# Patient Record
Sex: Male | Born: 1945
Health system: Southern US, Community
[De-identification: ages and names within clinical notes are randomized; demographics above are authoritative.]

## PROBLEM LIST (undated history)

## (undated) DIAGNOSIS — H269 Unspecified cataract: Secondary | ICD-10-CM

## (undated) DIAGNOSIS — I2510 Atherosclerotic heart disease of native coronary artery without angina pectoris: Secondary | ICD-10-CM

## (undated) DIAGNOSIS — N210 Calculus in bladder: Secondary | ICD-10-CM

## (undated) DIAGNOSIS — I10 Essential (primary) hypertension: Secondary | ICD-10-CM

## (undated) DIAGNOSIS — E785 Hyperlipidemia, unspecified: Secondary | ICD-10-CM

## (undated) HISTORY — DX: Unspecified cataract: H26.9

## (undated) HISTORY — DX: Essential (primary) hypertension: I10

## (undated) HISTORY — DX: Calculus in bladder: N21.0

## (undated) HISTORY — DX: Hyperlipidemia, unspecified: E78.5

## (undated) HISTORY — DX: Atherosclerotic heart disease of native coronary artery without angina pectoris: I25.10

---

## 1998-06-08 ENCOUNTER — Inpatient Hospital Stay (HOSPITAL_COMMUNITY): Admission: EM | Admit: 1998-06-08 | Discharge: 1998-06-11 | Payer: Self-pay | Admitting: Emergency Medicine

## 1998-06-09 ENCOUNTER — Encounter: Payer: Self-pay | Admitting: Cardiovascular Disease

## 1998-06-10 HISTORY — PX: CARDIAC CATHETERIZATION: SHX172

## 2002-08-05 ENCOUNTER — Encounter: Payer: Self-pay | Admitting: Urology

## 2002-08-07 ENCOUNTER — Inpatient Hospital Stay (HOSPITAL_COMMUNITY): Admission: EM | Admit: 2002-08-07 | Discharge: 2002-08-09 | Payer: Self-pay | Admitting: Urology

## 2002-08-07 ENCOUNTER — Encounter (INDEPENDENT_AMBULATORY_CARE_PROVIDER_SITE_OTHER): Payer: Self-pay | Admitting: Specialist

## 2002-12-11 ENCOUNTER — Other Ambulatory Visit: Admission: RE | Admit: 2002-12-11 | Discharge: 2002-12-11 | Payer: Self-pay | Admitting: Dermatology

## 2005-01-05 ENCOUNTER — Ambulatory Visit: Payer: Self-pay | Admitting: Family Medicine

## 2007-07-15 ENCOUNTER — Inpatient Hospital Stay (HOSPITAL_COMMUNITY): Admission: EM | Admit: 2007-07-15 | Discharge: 2007-07-17 | Payer: Self-pay | Admitting: Emergency Medicine

## 2007-07-16 HISTORY — PX: CARDIAC CATHETERIZATION: SHX172

## 2008-07-03 IMAGING — CR DG CHEST 1V PORT
1 series · 1 of 1 positions shown · non-contrast
Comparison: none

CLINICAL DATA: Chest pain, left arm pain, and diaphoresis.  History of coronary artery disease with coronary artery stents. 
 PORTABLE CHEST:

[AP]
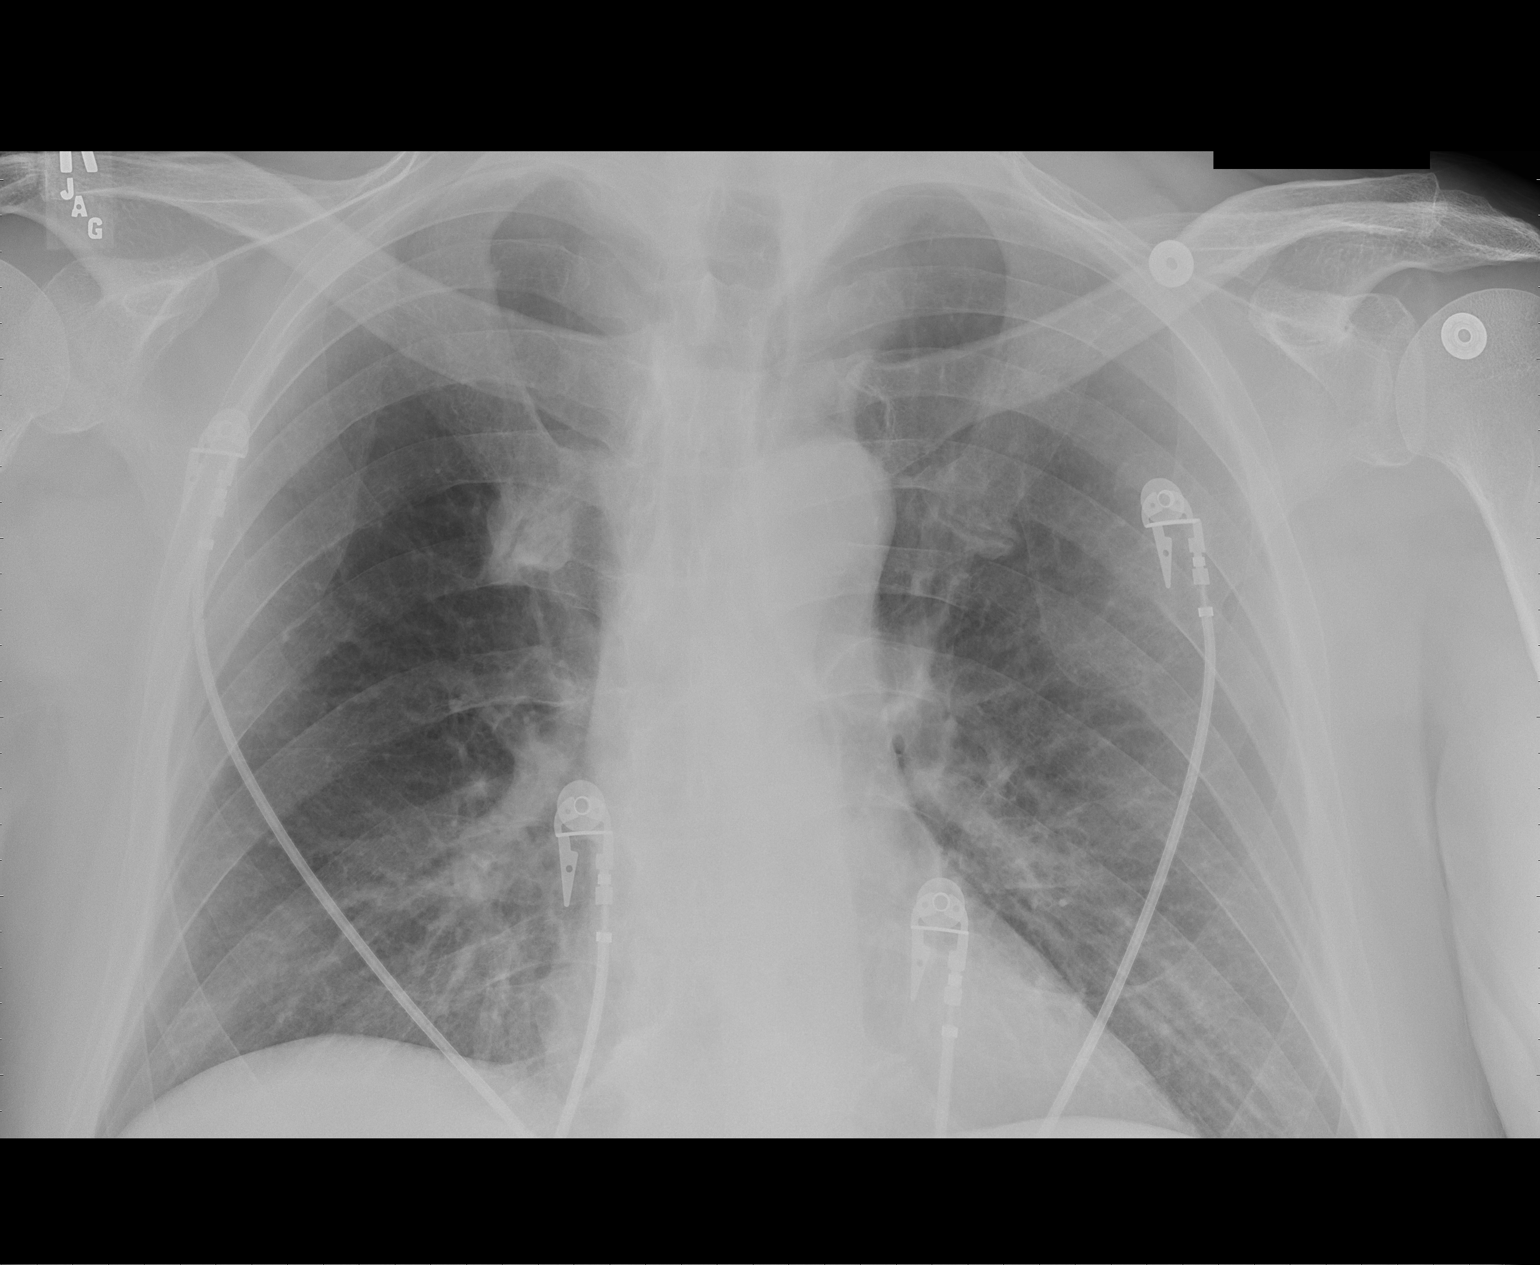

[1 of 1 positions shown; findings below may reference images not displayed]

FINDINGS: Heart size and vascularity are normal and the lungs are clear.  No bony abnormality.
IMPRESSION: No acute disease.

## 2008-08-03 HISTORY — PX: NM MYOCAR PERF WALL MOTION: HXRAD629

## 2010-11-15 NOTE — Cardiovascular Report (Signed)
NAMEPRUDENCIO, VELAZCO NO.:  [id number]   MEDICAL RECORD NO.:  [id number]          PATIENT TYPE:  INP   LOCATION:  2807                         FACILITY:  [facility]   PHYSICIAN:  Madaline Savage, M.D.DATE OF BIRTH:  [birth year]   DATE OF PROCEDURE:  DATE OF DISCHARGE:                            CARDIAC CATHETERIZATION   DATE OF PROCEDURE:  July 16, 2007.   PROCEDURES PERFORMED:  1. Intracoronary artery balloon angioplasty of the ostial and proximal      diagonal branch of the LAD.  2. Stenting of the proximal and ostial portions of the first diagonal      branch of the LAD.  3. Direct stenting of a mid circumflex stenosis of 75% reduced to      zero.   COMPLICATIONS:  None.   ENTRY SITE:  Right femoral.   DYE USED:  Omnipaque.   MEDICATIONS GIVEN:  1. Fentanyl 50 mg.  2. Versed 1 mg.  3. Plavix 600 mg p.o.  4. Infusion of Integrilin drip and multiple doses of unfractionated      heparin for anticoagulation purposes.   PATIENT PROFILE:  The patient is a pleasant 65 year old white gentleman  who is a medical patient of Dr. Joette Catching and a cardiology patient  of Dr. Susa Griffins, who entered the hospital with chest pain  yesterday and underwent catheterization by Dr. Alanda Amass today.  He was  found to have 90-95% proximal and ostial diagonal branch number 1  stenosis and then 75% stenosis of a mid circumflex obtuse marginal  branch that was just beyond a patent crown stent in the mid circumflex.  The cardiac catheterization done by Dr. Alanda Amass prior to this  intervention notes that there was a patent stent in the ostium of the  RCA, a 30% stenosis in the mid RCA and his ventricular function was 60%.   There were 2 guide catheters used for the procedure.  One was a left  Judkins-4 for the diagonal and a runway catheter which was a circumflex  left 3.5 configuration was used for the circumflex obtuse marginal.  The  first procedure performed  was on the ostial and proximal diagonal  stenosis of 90% in those 2 locations, and was covered with 1 stent which  was a 2.5 x 18 mm Promus stent which was carefully positioned so as to  respect the ostium of the LAD as it bifurcated into a diagonal and mid-  LAD.  Post-dilatation was not accomplished because of inability to then  thread a second balloon down the guidewire.  We encountered stent struts  that were unfavorable for passage of even a Maverick or a Dura Star  balloon.  Post-dilatation of the Promus stent was therefore not  performed.  The original Promus stent inflation pressure was 12  atmospheres.  The result after Promus stenting was the 2 lesions of 90%  were reduced to 0% residual and TIMI III distal flow was preserved.  There was an area at the junction between the ostial diagonal and the  LAD that was hard to see and looked hazy, and possibly could have  a 30-  40% residual narrowing there, but it is hard to tell.   The obtuse marginal branch number 1 was stented with a 2.5 x 10 mm  Promus stent and post-dilated with a Dura Star 3.0 x 10 to inflation  pressures of approximately 16.  That result showed now 2 overlapped  stents in the circumflex OM midway along the vessel with preservation of  TIMI III flow and a widely patent lumen.  No complications occurred.  The patient tolerated the procedure very well and was removed from the  cath table to the holding area, and will continue his Integrilin for  another 18 hours.   FINAL IMPRESSION:  Intracoronary artery stenting after balloon  angioplasty of the proximal and mid diagonal branch of the left anterior  descending with reduction of 90% stenosis to 0% residual in the proximal  diagonal and perhaps a 30% residual stenosis in the ostium of the  diagonal.  1. Successful stenting of the mid circumflex obtuse marginal branch      with reduction of a  75% lesion to 0% residual.           ______________________________   Madaline Savage, M.D.     WHG/MEDQ  D:  07/16/2007  T:  07/16/2007  Job:  [id number]   cc:   Gerlene Burdock A. Alanda Amass, M.D.

## 2010-11-15 NOTE — Discharge Summary (Signed)
William Jefferson, William Jefferson             ACCOUNT NO.:  [id number]   MEDICAL RECORD NO.:  [id number]          PATIENT TYPE:  INP   LOCATION:  6532                         FACILITY:  [facility]   PHYSICIAN:  Richard A. Alanda Amass, M.D.DATE OF BIRTH:  1945/12/19   DATE OF ADMISSION:  07/15/2007  DATE OF DISCHARGE:  07/17/2007                               DISCHARGE SUMMARY   DISCHARGE DIAGNOSES:  1. Angina, resolved.  2. Non-ST-elevation myocardial infarction.  3. Coronary disease with previous history of circumflex and right      coronary artery stents that were patent on this admission.      a.     New stents to the diagonal and to the circumflex for 90%       stenosis in the diagonal and 75% in the circumflex.      b.     Normal ejection fraction.  4. Hyperlipidemia with LDL of 136.  5. Hypertension, controlled.  6. Nonsustained ventricular tachycardia, resolved.  7. History of bladder stones and bladder outlet obstruction.  8. History of benign prostatic hypertrophy.  9. Coronary disease with previous stents placed in 1999.  Patient did      very well until July 15, 2007.   ALLERGIES:  PENICILLIN causes diarrhea.  SULFA reaction.  Intolerances  to CRESTOR, ZETIA, LIPITOR and ZOCOR caused severe leg fatigue.   DISCHARGE CONDITION:  Improved.   PROCEDURES:  1. On July 16, 2007, combined left heart catheterization by Dr.      Susa Griffins.  2. On July 16, 2007, PTCA and stent deployment with PROMUS drug      eluting stents to the circumflex and to the diagonal of the LAD.   DISCHARGE MEDICATIONS:  1. Saw palmetto daily as before.  2. Vitamins C, E as before.  3. Metoprolol tartrate 25 mg twice a day as before.  4. Aspirin 325 mg daily.  5. Continue sublingual nitroglycerin as needed for chest pain 1/150.   NEW MEDICATIONS INCLUDE:  1. Lisinopril 5 mg one daily.  2. Pravachol 2 mg one every evening.  3. Niaspan 500 mg one tablet at bedtime.  Take aspirin 30 minutes   prior to the Niaspan and take with low-fat crackers.  4. Plavix 75 mg one daily.  Do not stop; it could cause a heart      attack.  5. Continue Aciphex that Dr. Lysbeth Galas had given you.   DISCHARGE INSTRUCTIONS:  1. Do not return to work until you see Dr. Alanda Amass back in the      office.  2. Increase activity slowly, following cardiac rehab advice.  3. May shower or bathe.  4. No lifting for two weeks.  5. No driving for five days.  6. Wash right-groin cath site with soap and water.  Call if any      bleeding, swelling or drainage.  7. Follow up with Dr. Alanda Amass July 29, 2007, at 4:15.  We      canceled the August 02, 2007, appointment.   HISTORY OF PRESENT ILLNESS:  Patient is a 65 year old white married  male, patient of Dr. Alanda Amass.  He was seen by Dr. Lysbeth Galas on July 15, 2007, after presenting to their office with chest discomfort.  He  had been in his usual state of health.  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  Last week,  walking on January 12, he was going up and down steps in his home, doing  the laundry, etc, and developed midsternal chest discomfort, described  as a pressure, radiating across his chest.  No radiation to his back or  neck.  He did feel it went down his arms, but he was not quite sure  because he has been having wrist pain anyway.  He also broke out in a  cold sweat, but no shortness of breath or nausea.  He took one  nitroglycerin with relief, continued on with his daily activities.  Pain  returned.  Another nitro with relief.  Eventually, he took four  nitroglycerin, each time with relief, but continuing activities caused  the pain to return.  He was seen by Dr. Lysbeth Galas.  EKG was without change.  We saw him in Dr. Kandis Cocking office at their recommendation and did  take his history of being very stable since 1999, and now new pain.  Dr.  Alanda Amass was quite concerned, went ahead and  admitted him to [facility] on IV heparin.  Cardiac enzymes did come back positive.   Patient never had any further chest pain after seeing Dr. Alanda Amass in  the office until discharge.   PAST MEDICAL HISTORY:  Patient was not on statin, secondary to  intolerance, extreme leg fatigue with Crestor, Zetia, Lipitor and Zocor.  Dr. Alanda Amass and I discussed this in the office and in the hospital  went ahead and started Pravachol and Niaspan to see how he would do.  If  he becomes intolerant to all statins, then Welchol would be the next  choice to use in addition to Niaspan, if possible.   Past medical history as stated with history in 1999 of stents to the RCA  and to the circumflex that remain patent now.  He has had negative  nuclear studies since that time, the last one being in 2006.  He has had  a history of BPH, bladder stones and bladder outlet obstruction.   FAMILY HISTORY/SOCIAL HISTORY/REVIEW OF SYSTEMS:  See H and P.   PHYSICAL EXAM AT DISCHARGE:  Blood pressure 99/46 to 121/70, pulse 80s  to 90s, respirations 18, afebrile.  HEART:  Regular rate and rhythm without murmur, gallop or rub.  LUNGS:  Clear.  EXTREMITIES:  Right groin was stable, without hematoma.   LABORATORY DATA:  Hemoglobin on admission 16.6, hematocrit 49.3, WBC 12,  platelets 265, MCV 94, neutrophils 70, and this remained stable.  White  count dropped to 9.3 and was 11 at discharge.  Chemistry:  Sodium 142,  potassium 4.4, chloride 106, CO2 26, BUN 13, creatinine 1.30 and at  discharge 1.17, glucose 91.  Coags:  Pro-time on admission 12.5, INR  0.9, PTT 27, heparin 0.44 on heparin infusion.   GI:  AST 23, ALT 27, alkaline phosphatase 80, total bilirubin 0.8 and  albumin 3.8.  Cardiac enzymes:  Initial CK 220 with an MB of 17,  troponin I of 1.56.  At 8 hours, CK went to 301 with MB of 23.9 and a  troponin of 3.80, which was the peak and has come down since that time;  troponin 2.71,  CK-MB 218 with MB  of 14.3, and at discharge, troponin  1.30, CK 133 and MB 4.5.   Total cholesterol was 183, LDL 136, HDL 23, and triglycerides 621 and  therefore, we will keep him on Niaspan and Pravachol as he tolerates.   Calcium 8.9, magnesium 2.1.  Urinalysis:  I do not have those results.  BNP on admission was less than 30 and TSH was normal at 1.007.   Chest x-ray on admission:  No acute disease.   Cardiac catheterization:  As stated with normal EF of 60%.   HOSPITAL COURSE:  Patient was seen by Dr. Lysbeth Galas, referring to Dr.  Alanda Amass, and we admitted him to [facility] to rule out MI with many  years of previous no chest pain.  He was started on IV heparin.  He was  not started on nitroglycerin because he was not having any chest pain  and here, in the hospital, his enzymes did elevate for positive non-ST-  elevation MI.  His EKG never changed either.  Intravenous Integrilin was  added once enzymes were positive.  Patient did well that night.  The  next day, he underwent cardiac catheterization with new stenosis in the  diagonal of the LAD to 90%, most likely culprit vessel, and then a 75%  stenosis in the circumflex.  His previously-placed stents were open.  Patient did have distal LAD stenosis of 80-85% which would be treated  medically.  Patient underwent drug eluting stent PROMUS stents by Dr.  Elsie Lincoln and tolerated the procedure well.  He was given loading dose of  Plavix and transferred to 5500.  By January 14, the patient was  ambulating in the hall.  Cardiac rehab did talk with him.  Dr. Allyson Sabal saw  him and felt he was ready for discharge home, though he is not to return  to work until he sees Dr. Alanda Amass back.   On initial admission, he did have short bursts of four beats of  nonsustained ventricular tachycardia and none since that time.  We will  continue to try the Pravachol and the Niaspan.  Hopefully he will be  able to tolerate it and then he will follow up with Dr.  Alanda Amass.   Also, patient is contemplating attending cardiac rehab in [place].      Darcella Gasman. [place], N.P.      Richard A. Alanda Amass, M.D.  Electronically Signed    LRI/MEDQ  D:  07/17/2007  T:  07/17/2007  Job:  [id number]   cc:   Madaline Savage, M.D.  Delaney Meigs, M.D.

## 2010-11-15 NOTE — Cardiovascular Report (Signed)
William Jefferson, William Jefferson NO.:  [id number]   MEDICAL RECORD NO.:  [id number]          PATIENT TYPE:  INP   LOCATION:  2007                         FACILITY:  [facility]   PHYSICIAN:  William Jefferson, M.D.DATE OF BIRTH:  [birth year]   DATE OF PROCEDURE:  07/16/2007  DATE OF DISCHARGE:                            CARDIAC CATHETERIZATION   PROCEDURE:  Retrograde central aortic catheterization, selective  coronary angiography, pre and post IC nitroglycerin administration via  Judkins technique, LV angiogram, RAO/LAO projection, subselective LIMA,  abdominal aortic angiogram, midstream PA projection.   PROCEDURE:  The patient was brought to the second floor CP lab in the  postabsorptive state after 5 mg Valium p.o. premedication.  The right  groin was prepped, draped in the usual manner.  One percent Xylocaine  was used for local anesthesia.  Heparin and Integrilin were going.  Heparin was held on-call to the.  Integrilin was continued because of  SCMI with admission July 15, 2007 without EKG changes and new onset  chest pain with mild CPK, MB and troponin elevation (230, 23, 3).  No  recurrent chest pain since admission.  Informed consent was obtained to  proceed with the diagnostic study.  CRFA was entered with single  anterior puncture using an 18 thin-wall needle, and a 6-French short  sidearm sheath was inserted without difficulty.  Diagnostic coronary  angiography was done with 6-French 4-cm taper Cordis preformed coronary  and pigtail catheters with Omnipaque dye.  Guidewire exchange used  throughout the procedure.  Nitroglycerin 200 mcg was given into the left  coronary artery with repeat injections obtained in multiple projections  and fields of view.  Subselective LIMA was done by hand injection with  the right coronary catheter showing widely patent left subclavian, good  anterior vertebral flow and widely patent ungrafted LIMA.  LV angiogram  was done the  RAO and LAO projection:  25 mL - 14 mL per second; 20 mL -  12 mL per second.  Pullback pressure CA showed no gradient across the  aortic valve.  Pigtail catheter was brought down above the level of the  renal arteries, and abdominal aortic angiogram was done by hand  injection in the PA projection.  This demonstrated widely patent single  renal arteries bilaterally with no significant infrarenal narrowing or  aneurysm formation.  Catheters were removed.  Right femoral angiogram  was done in the shallow RAO projection showing good puncture into the  CRFA.  Side-arm sheath was flushed.  The patient tolerated the procedure  well.  Angiograms will be reviewed with my colleague, Dr. Elsie Lincoln.   Pressures:  LV:  110/0; LVEDP 12 mmHg.   CA:  110/70 mmHg.   There is no gradient between the LV and CA on catheter pullback.   Fluoroscopy did not reveal any significant coronary, intracardiac or  valvular calcification.  The previously placed right ostial stent and  mid CX OM1 stent were visible fluoroscopically.   LV angiogram in the RAO and LAO projection showed a vigorously  contracting ventricle, EF approximately 60% with no segmental wall  motion abnormality or mitral regurgitation.  The main left coronary was normal.   The proximal LAD was a very large vessel.  When it gave off the large  DX1 and continued, the lumen tapered down to about half the size which  was about 2.75-3.0 with the  proximal LAD about 5-0 vessel.  There was  no significant proximal LAD stenosis.  The LAD coursed to the apex of  the heart where it bifurcated.  There was an 80-85% mildly segmental  stenosis at the junction of the mid and distal third of the LAD, well  before the bifurcation.  There was approximately 40% narrowing of the  LAD mildly segmental beyond the first diagonal branch.   The first diagonal branch came out at a 70-90 degree angle from the LAD  and had an 85% ostial followed by a 90% proximal  stenosis.  It was a  moderately large vessel, probably 2.75 or greater, and bifurcated  distally.  There was good flow and no thrombus seen.   The circumflex artery gave off a large first marginal branch with a  patent, previously placed proximal - mid crown stent that had less than  20% narrowing.  Beyond the stent in the mid OM, there was a 60%  concentric narrowing, possibly 50% on some views, but there was good  flow.   The AV groove circ was widely patent and left atrial branch came off.   The right coronary was a large dominant vessel.  The previously placed  P150 iliac stent in the ostial and proximal RCA was widely patent with  less than 20% narrowing.  There was no ostial stenosis and no gradient  across the proximal RCA with the 6-French catheter.  There was 30%  concentric smooth narrowing in the mid RCA beyond a RV branch.  The PDA  branch arose before the acute margin, had no significant distal disease  __________  PLA branch bifurcated and had no significant distal disease.   DISCUSSION:  William Jefferson is a 65 year old white married father of 3.  He  was seen initially by [place] in 1999 with acute coronary syndrome and  underwent catheterization.  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.  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  He  has been active and ambulatory.  Has discontinued smoking.  Unfortunately, he has mild lipid elevation because he has been  intolerant to all statins try including PY 450 statins, and he has also  been intolerant to Zetia.  He has been on chronic aspirin and beta  blockers.  Recurrent angina, prompting admission, and elevated enzymes  compatible with SCMI and one episode of asymptomatic NSVT.  There is  good LV function, and he has high-grade proximal DX1 disease and distal  LAD disease.  The DX1 is probably the culprit lesion.   He has previous  ostial RCA stenting with a P150 iliac stent in a very  large RCA June 10, 1998.  He also 90% circumflex non-DES stenting  with a 3.5/16 __________  stent.  Both of these have remained open long-  term.   I have reviewed these angiograms with Dr. Elsie Lincoln.  Although we had some  LAD disease, the LAD disease is distal.  We feel that attempted PCI of  his culprit lesion diagonal and PCI of his distal LAD are warranted in  this setting.  I would not consider CABG without significant LAD  or  right coronary or circumflex disease which does not exist in this  patient.   He has good LV function and is able to take aspirin and antiplatelet  therapy.   CATHETERIZATION DIAGNOSES:  1. Arteriosclerotic heart disease - acute coronary syndrome December      1999 with successful non-DES iliac ostial and proximal RCA stenting      and non-DES mid CX OM stenting, patent long-term.  2. Recurrent angina with a small SCMI without EKG changes, culprit      lesion ostial and proximal DX1.  Associated mid to distal LAD      stenosis.  3. Normal left ventricular function.  4. Hyperlipidemia, intolerant to all statins and Zetia tried in the      past.  5. Remote smoker, quit 1999.      William Jefferson, M.D.  Electronically Signed     RAW/MEDQ  D:  07/16/2007  T:  07/16/2007  Job:  [id number]   cc:   Gerlene Burdock A. Alanda Jefferson, M.D.  CP Lab  Delaney Meigs, M.D.  Madaline Savage, M.D.

## 2010-11-18 NOTE — Op Note (Signed)
NAME:  William Jefferson, William Jefferson                       ACCOUNT NO.:  [id number]   MEDICAL RECORD NO.:  [id number]                   PATIENT TYPE:  AMB   LOCATION:  DFTL                                 FACILITY:  [facility]   PHYSICIAN:  Excell Seltzer. Annabell Howells, M.D.                 DATE OF BIRTH:  December 14, 1945   DATE OF PROCEDURE:  DATE OF DISCHARGE:                                 OPERATIVE REPORT   PROCEDURE:  Cystolithalopaxy for 3.2 cm stone and TURP.   PREOPERATIVE DIAGNOSES:  Bladder stone and benign prostatic hypertrophy with  bladder outlet obstruction.   POSTOPERATIVE DIAGNOSES:  Bladder stone and benign prostatic hypertrophy  with bladder outlet obstruction.   SURGEON:  Excell Seltzer. Annabell Howells, M.D.   ANESTHESIA:  General.   SPECIMENS:  Stone fragments and prostate chips.   DRAINS:  22 French three-way Foley catheter.   COMPLICATIONS:  None.   INDICATIONS FOR PROCEDURE:  William Jefferson is a 65 year old white male who  originally presented with acute epididymal orchitis. Following treatment,  his urine had persistent pyuria and hematuria. He had a history of stones, a  KUB was obtained which revealed a 3.2 x 2.5 cm calcification in the bladder.  It was felt that cystolithalopaxy with possible TURP was indicated. The  patient was made well aware of all risks involved with the procedure.   FINDINGS AND PROCEDURE:  The patient had been on antibiotics preoperatively.  He was given a dose of Tequin the morning of surgery, he was taken to the  operating room where a general anesthetic was induced. He was fitted with  thigh high TED hose, placed in lithotomy position. His perineum and  genitalia were prepped with Betadine solution, he was draped in the usual  sterile fashion. Cystoscopy was performed using the 22 [place] scope and the  12 and 70 degree lenses. Examination revealed a normal urethra and intact  external sphincter. The prostatic urethra had trilobar hyperplasia with  primarily a large middle  lobe which was obstructing. Examination of the  bladder revealed mild trabeculation. There was a large stone free within the  bladder lumen. There was significant inflammation of the bladder wall from  the stone and infection. The ureteral orifices were unremarkable. The  cystoscope was removed, the urethra was calibrated to 30 [place] with [employer] sounds and a 28 French continuous flow resectoscope sheath was  inserted. This was fitted with standard cystoscope bridge with an extension  and a 12 degree lens and the laser fiber was then passed along through the  working port to the stone. Power was initially set on 0.8 but was gradually  increased to 1.5 during treatment due to the hardness of the stone. The  stone was engaged. Initially there was some difficulty keeping the laser  fiber centered in the scope so eventually an Albarrans bridge was used along  with the Steri-Strips to secure the laser fiber  in appropriate position.  Once the position of the fiber was adequate, we were able to gradually  fragment the stone into 1 cm or less fragments. Once the stone had been  completely fragmented which took approximately 45 minutes to an hour, the  stone fragments were removed without difficulty. I then placed an Loa  handle with a 12 degree lens and 26 loop through the resectoscope and  switched from saline to glycine and then performed a TURP. The initial  resection was from bladder 5 to 7 o'clock at the bladder neck with resection  of the middle lobe. The floor of the prostate was then resected out along  side the veru. There was not a large amount of lateral lobe enlargement,  some resection of the lateral lobes was performed bilaterally but I was  careful to avoid injury to the apical area. No anterior resection was  needed. Once adequate tissue was resected, hemostasis was achieved and the  bladder was evacuated free of chips and some remaining stone dust. At this  point, final  inspection revealed an intact external sphincter, no active  bleeding. The ureteral orifices were intact as well. The bladder was left  full, the scope was removed, pressure on the bladder produced an excellent  stream. A 22 French three-way Foley catheter was then inserted, the balloon  was filled with 30 cc of sterile fluid and held on traction while the  catheter was irrigated. The irrigant rapidly cleared. The patient was placed  on continuous irrigation and the catheter was placed to straight drainage  and secured with mild traction to the patient's leg after he was taken down  from the lithotomy position. His anesthetic was then reversed. He was moved  to the recovery room in stable condition. There were no complications.                                               Excell Seltzer. Annabell Howells, M.D.    JJW/MEDQ  D:  08/07/2002  T:  08/07/2002  Job:  [id number]   cc:   Colon Flattery  [address]  [place]  [place] [address]  Fax: [phone]   Richard A. Alanda Amass, M.D.  [phone] [address]  [place]  [place] [address]  Fax: [phone]

## 2010-12-02 ENCOUNTER — Ambulatory Visit (INDEPENDENT_AMBULATORY_CARE_PROVIDER_SITE_OTHER): Payer: Federal, State, Local not specified - PPO | Admitting: Urology

## 2010-12-02 DIAGNOSIS — N453 Epididymo-orchitis: Secondary | ICD-10-CM

## 2010-12-02 DIAGNOSIS — D400 Neoplasm of uncertain behavior of prostate: Secondary | ICD-10-CM

## 2010-12-02 DIAGNOSIS — R972 Elevated prostate specific antigen [PSA]: Secondary | ICD-10-CM

## 2011-01-06 ENCOUNTER — Ambulatory Visit (INDEPENDENT_AMBULATORY_CARE_PROVIDER_SITE_OTHER): Payer: Federal, State, Local not specified - PPO | Admitting: Urology

## 2011-01-06 DIAGNOSIS — N453 Epididymo-orchitis: Secondary | ICD-10-CM

## 2011-01-06 DIAGNOSIS — N401 Enlarged prostate with lower urinary tract symptoms: Secondary | ICD-10-CM

## 2011-01-06 DIAGNOSIS — R972 Elevated prostate specific antigen [PSA]: Secondary | ICD-10-CM

## 2011-03-23 LAB — MAGNESIUM: Magnesium: 2.1

## 2011-03-23 LAB — CARDIAC PANEL(CRET KIN+CKTOT+MB+TROPI)
CK, MB: 23.9 — ABNORMAL HIGH
CK, MB: 4.5 — ABNORMAL HIGH
Total CK: 133
Total CK: 301 — ABNORMAL HIGH
Troponin I: 1.3

## 2011-03-23 LAB — COMPREHENSIVE METABOLIC PANEL
ALT: 27
BUN: 13
CO2: 26
Calcium: 9.4
Creatinine, Ser: 1.3
GFR calc non Af Amer: 56 — ABNORMAL LOW
Glucose, Bld: 96
Sodium: 142
Total Protein: 7.1

## 2011-03-23 LAB — BASIC METABOLIC PANEL
Calcium: 8.9
Creatinine, Ser: 1.17
GFR calc Af Amer: >60
GFR calc non Af Amer: >60
Sodium: 136

## 2011-03-23 LAB — CK TOTAL AND CKMB (NOT AT ARMC)
CK, MB: 17 — ABNORMAL HIGH
CK, MB: 6.9 — ABNORMAL HIGH
Relative Index: 7.7 — ABNORMAL HIGH
Total CK: 141
Total CK: 220

## 2011-03-23 LAB — CBC
HCT: 48.1
HCT: 49.3
Hemoglobin: 16.3
Hemoglobin: 16.6
MCHC: 33.7
MCHC: 33.9
MCHC: 34.1
MCV: 93.6
MCV: 94.1
Platelets: 236
Platelets: 250
Platelets: 265
RBC: 5.24
RDW: 12.8
RDW: 12.9
RDW: 13
WBC: 12.1 — ABNORMAL HIGH

## 2011-03-23 LAB — LIPID PANEL
Cholesterol: 183
LDL Cholesterol: 136 — ABNORMAL HIGH
Total CHOL/HDL Ratio: 8
Triglycerides: 118

## 2011-03-23 LAB — PROTIME-INR
INR: 0.9
Prothrombin Time: 12.5

## 2011-03-23 LAB — TSH: TSH: 1.007

## 2011-03-23 LAB — DIFFERENTIAL
Eosinophils Absolute: 0.1
Lymphs Abs: 2.5
Monocytes Relative: 8
Neutro Abs: 8.5 — ABNORMAL HIGH
Neutrophils Relative %: 70

## 2011-03-23 LAB — TROPONIN I
Troponin I: 1.4
Troponin I: 1.56

## 2012-08-31 HISTORY — PX: TRANSTHORACIC ECHOCARDIOGRAM: SHX275

## 2012-09-11 ENCOUNTER — Other Ambulatory Visit (HOSPITAL_COMMUNITY): Payer: Self-pay | Admitting: Cardiovascular Disease

## 2012-09-11 DIAGNOSIS — I4510 Unspecified right bundle-branch block: Secondary | ICD-10-CM

## 2012-09-11 DIAGNOSIS — I358 Other nonrheumatic aortic valve disorders: Secondary | ICD-10-CM

## 2012-09-19 ENCOUNTER — Ambulatory Visit (HOSPITAL_COMMUNITY)
Admission: RE | Admit: 2012-09-19 | Discharge: 2012-09-19 | Disposition: A | Discharge status: Home / Self Care | Payer: Federal, State, Local not specified - PPO | Source: Ambulatory Visit | Attending: Cardiovascular Disease | Admitting: Cardiovascular Disease

## 2012-09-19 DIAGNOSIS — R011 Cardiac murmur, unspecified: Secondary | ICD-10-CM | POA: Insufficient documentation

## 2012-09-19 DIAGNOSIS — I358 Other nonrheumatic aortic valve disorders: Secondary | ICD-10-CM

## 2012-09-19 NOTE — Progress Notes (Signed)
[facility]   2D echo completed 09/19/2012.   Cindy Kaycee Haycraft, RDCS  

## 2012-11-26 ENCOUNTER — Telehealth: Payer: Self-pay | Admitting: Cardiovascular Disease

## 2012-11-26 NOTE — Telephone Encounter (Signed)
Message forwarded to [facility]. Berlinda Last, LPN.  [place] patient.

## 2012-11-26 NOTE — Telephone Encounter (Signed)
Returned call.  Pt informed Dr. Alanda Amass and his nurse, Kem Boroughs will be in the [place] office on Thursday to look at his chart to find out what he needs.  Pt verbalized understanding and agreed w/ plan.

## 2012-12-03 NOTE — Telephone Encounter (Signed)
Called pt. On 12-02-2012 no answer. I was going to talk to the pt. About his call that he made on 11-26-2012 and see oif he had received his paperwok

## 2013-01-17 ENCOUNTER — Other Ambulatory Visit: Payer: Self-pay | Admitting: Cardiology

## 2013-02-06 ENCOUNTER — Encounter: Payer: Self-pay | Admitting: Cardiovascular Disease

## 2013-02-12 ENCOUNTER — Other Ambulatory Visit: Payer: Self-pay | Admitting: Cardiovascular Disease

## 2013-02-12 LAB — LIPID PANEL
Total CHOL/HDL Ratio: 6.1 Ratio
VLDL: 30 mg/dL (ref 0–40)

## 2013-02-12 LAB — COMPREHENSIVE METABOLIC PANEL
ALT: 18 U/L (ref 0–53)
AST: 16 U/L (ref 0–37)
Calcium: 9.5 mg/dL (ref 8.4–10.5)
Chloride: 106 mEq/L (ref 96–112)
Creat: 1.14 mg/dL (ref 0.50–1.35)
Sodium: 140 mEq/L (ref 135–145)
Total Bilirubin: 1 mg/dL (ref 0.3–1.2)

## 2013-02-12 LAB — CBC WITH DIFFERENTIAL/PLATELET
Basophils Absolute: 0 10*3/uL (ref 0.0–0.1)
Eosinophils Relative: 3 % (ref 0–5)
Lymphocytes Relative: 22 % (ref 12–46)
MCV: 89 fL (ref 78.0–100.0)
Neutro Abs: 5.6 10*3/uL (ref 1.7–7.7)
Neutrophils Relative %: 67 % (ref 43–77)
Platelets: 223 10*3/uL (ref 150–400)
RDW: 13.6 % (ref 11.5–15.5)
WBC: 8.3 10*3/uL (ref 4.0–10.5)

## 2013-02-12 LAB — TSH: TSH: 1.66 u[IU]/mL (ref 0.350–4.500)

## 2013-04-28 ENCOUNTER — Other Ambulatory Visit: Payer: Self-pay | Admitting: Cardiology

## 2013-04-28 NOTE — Telephone Encounter (Signed)
Rx was sent to pharmacy electronically. 

## 2013-08-14 ENCOUNTER — Encounter: Payer: Self-pay | Admitting: *Deleted

## 2013-08-14 ENCOUNTER — Ambulatory Visit (INDEPENDENT_AMBULATORY_CARE_PROVIDER_SITE_OTHER): Payer: Federal, State, Local not specified - PPO | Admitting: Internal Medicine

## 2013-08-14 ENCOUNTER — Encounter: Payer: Self-pay | Admitting: Internal Medicine

## 2013-08-14 VITALS — BP 100/62 | HR 62 | Ht 77.0 in | Wt 217.4 lb

## 2013-08-14 DIAGNOSIS — I10 Essential (primary) hypertension: Secondary | ICD-10-CM | POA: Insufficient documentation

## 2013-08-14 DIAGNOSIS — E785 Hyperlipidemia, unspecified: Secondary | ICD-10-CM

## 2013-08-14 DIAGNOSIS — E782 Mixed hyperlipidemia: Secondary | ICD-10-CM | POA: Insufficient documentation

## 2013-08-14 DIAGNOSIS — I2510 Atherosclerotic heart disease of native coronary artery without angina pectoris: Secondary | ICD-10-CM | POA: Insufficient documentation

## 2013-08-14 NOTE — Patient Instructions (Signed)
Your physician wants you to follow-up in: 1 year. You will receive a reminder letter in the mail two months in advance. If you don't receive a letter, please call our office to schedule the follow-up appointment.  

## 2013-08-14 NOTE — Progress Notes (Signed)
OFFICE NOTE  Chief Complaint:  No complaints, establish new cardiologist  Primary Care Physician: William Mustache, MD  HPI:  William Jefferson is a pleasant 68 year old male formerly followed by Dr. Rollene Jefferson. He is here today to establish new cardiac care. His past medical history is significant for coronary artery disease. In 1999 he was having symptoms of angina and presented to her primary care physician who performed an EKG and sent him to the emergency room. There he will have angiography and was found to have a ostial right coronary artery lesion of a very large vessel. He ultimately had placement of an iliac stent, as no coronary stents of that size were available at the time. This is actually stayed very patent for long period of time. In 2009 he had another episode of chest pain and was found to have circumflex and diagonal lesions which were stented with drug-eluting stents. The OM1 was stented with a 2 5 x 10 mm Promus stent that was postdilated to 3.0 mm.  The diagonal was a 2 5 x 18 mm Promus stent that there was also postdilated to 2.75 mm. He's done well since then without any chest pain complaints. 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 He denies any chest pain or shortness of breath. Unfortunately been intolerant to all statins and Zetia. He is unwilling to take any more of those medicines as is found in its cause leg pain, weakness and difficulty performing his job. He is on monotherapy Plavix, but not aspirin, but denies any history of allergy aspirin. His only other issue is a rising PSA. He said a number of prostate biopsies and actually developed an infection at one time. He's had atypical cells but no determinant prostate cancer.  PMHx:  Past Medical History  Diagnosis Date  . Bladder stone     removed in 2014 by Dr. Jeffie Jefferson  . Hyperlipidemia     statin intolerance  . Hypertension   . Coronary  artery disease     Past Surgical History  Procedure Laterality Date  . Cardiac catheterization  06/10/1998    large P150 iliac stent to RCA - 3.5x7mm NIR stent (Dr. Marella Jefferson)  . Transthoracic echocardiogram  08/2012    mild conc LVH  . Nm myocar perf wall motion  08/2008    bruce myoview; normal pattern of perfusion, EF 77%  . Cardiac catheterization  07/16/2007    mid-distal LAD stenosis, normal LV function, 2.5x24mm Promus stent to obtuse marginal, 2.5x19mm Promus stent to osital & proximal diagonal (Dr. Marella Jefferson, Dr. Domenic Jefferson)    FAMHx:  No family history on file.  SOCHx:   reports that he quit smoking about 16 years ago. He does not have any smokeless tobacco history on file. His alcohol and drug histories are not on file.  ALLERGIES:  Allergies  Allergen Reactions  . Crestor [Rosuvastatin]   . Lipitor [Atorvastatin]   . Penicillins   . Sulfa Antibiotics   . Zetia [Ezetimibe]   . Zocor [Simvastatin]     ROS: A comprehensive review of systems was negative.  HOME MEDS: Current Outpatient Prescriptions  Medication Sig Dispense Refill  . cephALEXin (KEFLEX) 500 MG capsule Take 500 mg by mouth daily.      . clopidogrel (PLAVIX) 75 MG tablet TAKE ONE TABLET BY MOUTH ONE TIME DAILY  30 tablet  6  . lisinopril (PRINIVIL,ZESTRIL) 5 MG  tablet TAKE ONE TABLET BY MOUTH ONE TIME DAILY  30 tablet  5  . Lutein 20 MG CAPS Take 1 capsule by mouth daily.      . metoprolol tartrate (LOPRESSOR) 25 MG tablet Take 25 mg by mouth 2 (two) times daily.      . Saw Palmetto 450 MG CAPS Take 1 capsule by mouth daily.      . vitamin C (ASCORBIC ACID) 500 MG tablet Take 500 mg by mouth daily.       No current facility-administered medications for this visit.    LABS/IMAGING: No results found for this or any previous visit (from the past 48 hour(s)). No results found.  VITALS: BP 100/62  Pulse 62  Ht 6\' 5"  (1.956 m)  Wt 217 lb 6.4 oz (98.612 kg)  BMI 25.77 kg/m2  EXAM: General  appearance: alert and no distress Neck: no carotid bruit and no JVD Lungs: clear to auscultation bilaterally Heart: regular rate and rhythm, S1, S2 normal, no murmur, click, rub or gallop Abdomen: soft, non-tender; bowel sounds normal; no masses,  no organomegaly Extremities: extremities normal, atraumatic, no cyanosis or edema Pulses: 2+ and symmetric Skin: Skin color, texture, turgor normal. No rashes or lesions Neurologic: Grossly normal Psych: Mood, affect normal  EKG: Normal sinus rhythm at 62  ASSESSMENT: 1. Coronary artery disease status post PCI to the ostial RCA in 1999, proximal diagonal and circumflex marginal in 2009 2. Dyslipidemia-intolerant to statins and Zetia 3. Hypertension-controlled 4. Elevated PSA  PLAN: 1.   Mr. William Jefferson is doing well and is asymptomatic. 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 He is on Plavix monotherapy, but can't stop this for up to 5 days prior to a prostate biopsy if this is necessary in the future. Unfortunately his been intolerant to statins, but may be a good candidate for a PCSK9 inhibitor in the future.  His blood pressure is well controlled. Plan continue his current medications and we'll see him back annually or sooner as necessary.  Pixie Casino, MD, [facility] Attending Cardiologist [facility]  Dev Dhondt C 08/14/2013, 10:13 AM

## 2013-08-21 ENCOUNTER — Other Ambulatory Visit: Payer: Self-pay | Admitting: Cardiology

## 2013-08-21 NOTE — Telephone Encounter (Signed)
Rx was sent to pharmacy electronically. 

## 2013-10-18 ENCOUNTER — Other Ambulatory Visit: Payer: Self-pay | Admitting: Cardiology

## 2013-10-20 NOTE — Telephone Encounter (Signed)
Rx was sent to pharmacy electronically. 

## 2014-01-20 ENCOUNTER — Other Ambulatory Visit: Payer: Self-pay | Admitting: *Deleted

## 2014-01-20 MED ORDER — METOPROLOL TARTRATE 25 MG PO TABS: 25.0000 mg | ORAL_TABLET | Freq: Two times a day (BID) | ORAL | Status: DC

## 2014-01-20 NOTE — Telephone Encounter (Signed)
Rx refill sent to patient pharmacy   

## 2014-08-15 ENCOUNTER — Other Ambulatory Visit: Payer: Self-pay | Admitting: Internal Medicine

## 2014-08-17 NOTE — Telephone Encounter (Signed)
Rx(s) sent to pharmacy electronically. OV 08/20/14

## 2014-08-20 ENCOUNTER — Ambulatory Visit (INDEPENDENT_AMBULATORY_CARE_PROVIDER_SITE_OTHER): Payer: Medicare Other | Admitting: Internal Medicine

## 2014-08-20 ENCOUNTER — Encounter: Payer: Self-pay | Admitting: Internal Medicine

## 2014-08-20 VITALS — BP 132/82 | HR 58 | Ht 77.0 in | Wt 216.0 lb

## 2014-08-20 DIAGNOSIS — E785 Hyperlipidemia, unspecified: Secondary | ICD-10-CM

## 2014-08-20 DIAGNOSIS — I2510 Atherosclerotic heart disease of native coronary artery without angina pectoris: Secondary | ICD-10-CM | POA: Diagnosis not present

## 2014-08-20 DIAGNOSIS — I2583 Coronary atherosclerosis due to lipid rich plaque: Secondary | ICD-10-CM

## 2014-08-20 NOTE — Progress Notes (Signed)
OFFICE NOTE  Chief Complaint:  No complaints  Primary Care Physician: Sherrie Mustache, MD  HPI:  William Jefferson is a pleasant 69 year old male formerly followed by Dr. Rollene Jefferson. He is here today to establish new cardiac care. His past medical history is significant for coronary artery disease. In 1999 he was having symptoms of angina and presented to her primary care physician who performed an EKG and sent him to the emergency room. There he will have angiography and was found to have a ostial right coronary artery lesion of a very large vessel. He ultimately had placement of an iliac stent, as no coronary stents of that size were available at the time. This is actually stayed very patent for long period of time. In 2009 he had another episode of chest pain and was found to have circumflex and diagonal lesions which were stented with drug-eluting stents. The OM1 was stented with a 2 5 x 10 mm Promus stent that was postdilated to 3.0 mm.  The diagonal was a 2 5 x 18 mm Promus stent that there was also postdilated to 2.75 mm. He's done well since then without any chest pain complaints. 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 He denies any chest pain or shortness of breath. Unfortunately been intolerant to all statins and Zetia. He is unwilling to take any more of those medicines as is found in its cause leg pain, weakness and difficulty performing his job. He is on monotherapy Plavix, but not aspirin, but denies any history of allergy aspirin. His only other issue is a rising PSA. He said a number of prostate biopsies and actually developed an infection at one time. He's had atypical cells but no determinant prostate cancer.  I saw William Jefferson back today. He continues to be without complaints. 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 He does have a history of being intolerant to at  least 5 or 6 different statins. He's not as cholesterol checked in about 4 years. We discussed the new medications that are now out to treat cholesterol a said that he be willing to try at least a low-dose statin is to be on a qualifying dose of max tolerated therapy. We would either have to recheck his cholesterol level.  PMHx:  Past Medical History  Diagnosis Date  . Bladder stone     removed in 2014 by William Jefferson  . Hyperlipidemia     statin intolerance  . Hypertension   . Coronary artery disease     Past Surgical History  Procedure Laterality Date  . Cardiac catheterization  06/10/1998    large P150 iliac stent to RCA - 3.5x47mm NIR stent (Dr. Marella Jefferson)  . Transthoracic echocardiogram  08/2012    mild conc LVH  . Nm myocar perf wall motion  08/2008    bruce myoview; normal pattern of perfusion, EF 77%  . Cardiac catheterization  07/16/2007    mid-distal LAD stenosis, normal LV function, 2.5x61mm Promus stent to obtuse marginal, 2.5x53mm Promus stent to osital & proximal diagonal (Dr. Marella Jefferson, Dr. Domenic Jefferson)    FAMHx:  No family history on file.  SOCHx:   reports that he quit smoking about 17 years ago. He does not have any smokeless tobacco history on file. His alcohol and drug histories are not on file.  ALLERGIES:  Allergies  Allergen Reactions  . Crestor [Rosuvastatin]   . Lipitor [Atorvastatin]   . Penicillins   . Sulfa Antibiotics   . Zetia [Ezetimibe]   . Zocor [Simvastatin]     ROS: A comprehensive review of systems was negative.  HOME MEDS: Current Outpatient Prescriptions  Medication Sig Dispense Refill  . cephALEXin (KEFLEX) 500 MG capsule Take 500 mg by mouth daily.    . clopidogrel (PLAVIX) 75 MG tablet TAKE ONE TABLET BY MOUTH ONE TIME DAILY 30 tablet 0  . lisinopril (PRINIVIL,ZESTRIL) 5 MG tablet TAKE ONE TABLET BY MOUTH ONE TIME DAILY 30 tablet 0  . Lutein 20 MG CAPS Take 1 capsule by mouth daily.    . metoprolol tartrate (LOPRESSOR) 25 MG  tablet Take 12.5 mg by mouth 2 (two) times daily.    . Saw Palmetto 450 MG CAPS Take 1 capsule by mouth daily.    . vitamin C (ASCORBIC ACID) 500 MG tablet Take 500 mg by mouth daily.     No current facility-administered medications for this visit.    LABS/IMAGING: No results found for this or any previous visit (from the past 48 hour(s)). No results found.  VITALS: BP 132/82 mmHg  Pulse 58  Ht 6\' 5"  (1.956 m)  Wt 216 lb (97.977 kg)  BMI 25.61 kg/m2  EXAM: General appearance: alert and no distress Neck: no carotid bruit and no JVD Lungs: clear to auscultation bilaterally Heart: regular rate and rhythm, S1, S2 normal, no murmur, click, rub or gallop Abdomen: soft, non-tender; bowel sounds normal; no masses,  no organomegaly Extremities: extremities normal, atraumatic, no cyanosis or edema Pulses: 2+ and symmetric Skin: Skin color, texture, turgor normal. No rashes or lesions Neurologic: Grossly normal Psych: Mood, affect normal  EKG: Sinus bradycardia 58  ASSESSMENT: 1. Coronary artery disease status post PCI to the ostial RCA in 1999, proximal diagonal and circumflex marginal in 2009 2. Dyslipidemia-intolerant to statins and Zetia 3. Hypertension-controlled 4. Elevated PSA - due to prostatitis  PLAN: 1.   William Jefferson would benefit from cholesterol reduction if his cholesterol remains elevated. Given the new medications are now available I would like to start him on low-dose cholesterol therapy if his cholesterol is elevated. We'll go ahead and recheck that today, but plan on likely starting Crestor 5 mg weekly. Hopefully this would give him some reduction in cholesterol and may be chemotherapy candidate for the new PCSK9 inhibitors.  Plan follow-up annually but I'll be in contact with results of his cholesterol studies.   Pixie Casino, MD, [facility] Attending Cardiologist [facility]  William Jefferson C 08/20/2014, 4:26 PM

## 2014-08-20 NOTE — Patient Instructions (Signed)
Your physician recommends that you return for lab work FASTING  Your physician wants you to follow-up in: 1 year.  You will receive a reminder letter in the mail two months in advance. If you don't receive a letter, please call our office to schedule the follow-up appointment.

## 2014-08-22 LAB — LIPID PANEL
CHOL/HDL RATIO: 6.4 ratio — AB (ref 0.0–5.0)
Cholesterol, Total: 212 mg/dL — ABNORMAL HIGH (ref 100–199)
HDL: 33 mg/dL — ABNORMAL LOW (ref >39)
LDL CALC: 156 mg/dL — AB (ref 0–99)
TRIGLYCERIDES: 113 mg/dL (ref 0–149)
VLDL Cholesterol Cal: 23 mg/dL (ref 5–40)

## 2014-09-03 ENCOUNTER — Telehealth: Payer: Self-pay | Admitting: *Deleted

## 2014-09-03 DIAGNOSIS — E785 Hyperlipidemia, unspecified: Secondary | ICD-10-CM

## 2014-09-03 MED ORDER — ROSUVASTATIN CALCIUM 5 MG PO TABS: ORAL_TABLET | ORAL | Status: DC

## 2014-09-03 NOTE — Telephone Encounter (Signed)
Patient notified of results. He will try plan as denoted below.  Medication and labs ordered. Lab slips mailed to patient.

## 2014-09-03 NOTE — Telephone Encounter (Signed)
-----   Message from Pixie Casino, MD sent at 09/01/2014  2:34 PM EST ----- Yes, let's try Crestor 5 mg Q weekly at night. Re-check regular lipid profile in 3 months.  Dr. Lemmie Evens

## 2014-09-15 ENCOUNTER — Other Ambulatory Visit: Payer: Self-pay | Admitting: Internal Medicine

## 2014-09-16 NOTE — Telephone Encounter (Signed)
Rx(s) sent to pharmacy electronically.  

## 2014-12-26 LAB — LIPID PANEL
CHOL/HDL RATIO: 5.9 ratio
Cholesterol: 170 mg/dL (ref 0–200)
HDL: 29 mg/dL — ABNORMAL LOW (ref >=40)
LDL CALC: 116 mg/dL — AB (ref 0–99)
Triglycerides: 127 mg/dL (ref <150)
VLDL: 25 mg/dL (ref 0–40)

## 2014-12-29 ENCOUNTER — Telehealth: Payer: Self-pay | Admitting: Internal Medicine

## 2014-12-29 NOTE — Telephone Encounter (Signed)
Returning your call. °

## 2014-12-29 NOTE — Telephone Encounter (Signed)
Returned patient's call.  Lab results discussed with patient.

## 2014-12-31 ENCOUNTER — Telehealth: Payer: Self-pay | Admitting: Internal Medicine

## 2014-12-31 MED ORDER — PRAVASTATIN SODIUM 40 MG PO TABS: 40.0000 mg | ORAL_TABLET | Freq: Every evening | ORAL | Status: DC

## 2014-12-31 NOTE — Telephone Encounter (Signed)
Please call,returning your call.

## 2014-12-31 NOTE — Telephone Encounter (Signed)
Spoke with patient regarding labs & med changes. He is agreeable to trying pravastatin 40mg  daily and will let us know if he has side effects.  Rx(s) sent to pharmacy electronically.

## 2015-01-22 ENCOUNTER — Encounter: Payer: Self-pay | Admitting: Internal Medicine

## 2015-05-21 ENCOUNTER — Other Ambulatory Visit: Payer: Self-pay | Admitting: Internal Medicine

## 2015-05-21 MED ORDER — CLOPIDOGREL BISULFATE 75 MG PO TABS: 75.0000 mg | ORAL_TABLET | Freq: Every day | ORAL | Status: DC

## 2015-07-09 ENCOUNTER — Other Ambulatory Visit: Payer: Self-pay | Admitting: Internal Medicine

## 2015-07-09 NOTE — Telephone Encounter (Signed)
Rx(s) sent to pharmacy electronically.  

## 2015-08-13 ENCOUNTER — Encounter: Payer: Self-pay | Admitting: Internal Medicine

## 2015-08-13 ENCOUNTER — Ambulatory Visit (INDEPENDENT_AMBULATORY_CARE_PROVIDER_SITE_OTHER): Payer: Federal, State, Local not specified - PPO | Admitting: Internal Medicine

## 2015-08-13 VITALS — BP 132/84 | HR 56 | Ht 75.0 in | Wt 214.0 lb

## 2015-08-13 DIAGNOSIS — E785 Hyperlipidemia, unspecified: Secondary | ICD-10-CM | POA: Diagnosis not present

## 2015-08-13 DIAGNOSIS — I10 Essential (primary) hypertension: Secondary | ICD-10-CM | POA: Diagnosis not present

## 2015-08-13 DIAGNOSIS — I2510 Atherosclerotic heart disease of native coronary artery without angina pectoris: Secondary | ICD-10-CM

## 2015-08-13 NOTE — Progress Notes (Signed)
OFFICE NOTE  Chief Complaint:  No complaints  Primary Care Physician: Sherrie Mustache, MD  HPI:  William Jefferson is a pleasant 70 year old male formerly followed by Dr. Rollene Fare. He is here today to establish new cardiac care. His past medical history is significant for coronary artery disease. In 1999 he was having symptoms of angina and presented to her primary care physician who performed an EKG and sent him to the emergency room. There he will have angiography and was found to have a ostial right coronary artery lesion of a very large vessel. He ultimately had placement of an iliac stent, as no coronary stents of that size were available at the time. This is actually stayed very patent for long period of time. In 2009 he had another episode of chest pain and was found to have circumflex and diagonal lesions which were stented with drug-eluting stents. The OM1 was stented with a 2 5 x 10 mm Promus stent that was postdilated to 3.0 mm.  The diagonal was a 2 5 x 18 mm Promus stent that there was also postdilated to 2.75 mm. He's done well since then without any chest pain complaints. 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 He denies any chest pain or shortness of breath. Unfortunately been intolerant to all statins and Zetia. He is unwilling to take any more of those medicines as is found in its cause leg pain, weakness and difficulty performing his job. He is on monotherapy Plavix, but not aspirin, but denies any history of allergy aspirin. His only other issue is a rising PSA. He said a number of prostate biopsies and actually developed an infection at one time. He's had atypical cells but no determinant prostate cancer.  I saw William Jefferson back today. He continues to be without complaints. 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 He does have a history of being intolerant to at  least 5 or 6 different statins. He's not as cholesterol checked in about 4 years. We discussed the new medications that are now out to treat cholesterol a said that he be willing to try at least a low-dose statin is to be on a qualifying dose of max tolerated therapy. We would either have to recheck his cholesterol level.  William Jefferson returns today for follow-up. Overall he is doing well and is asymptomatic. He is started taking pravastatin 1-2 times a week which is certainly better than nothing. He's been intolerant of all other statins including Zetia. He's managed to lose a couple pounds but generally hovers around 215. Blood pressure is well-controlled.  PMHx:  Past Medical History  Diagnosis Date  . Bladder stone     removed in 2014 by Dr. Jeffie Pollock  . Hyperlipidemia     statin intolerance  . Hypertension   . Coronary artery disease     Past Surgical History  Procedure Laterality Date  . Cardiac catheterization  06/10/1998    large P150 iliac stent to RCA - 3.5x75mm NIR stent (Dr. Marella Chimes)  . Transthoracic echocardiogram  08/2012    mild conc LVH  . Nm myocar perf wall motion  08/2008    bruce myoview; normal pattern of perfusion, EF 77%  . Cardiac catheterization  07/16/2007    mid-distal LAD stenosis, normal LV function, 2.5x27mm Promus stent to obtuse marginal, 2.5x70mm Promus stent to osital &  proximal diagonal (Dr. Marella Chimes, Dr. Domenic Moras)    FAMHx:  History reviewed. No pertinent family history.  SOCHx:   reports that he quit smoking about 18 years ago. He does not have any smokeless tobacco history on file. His alcohol and drug histories are not on file.  ALLERGIES:  Allergies  Allergen Reactions  . Crestor [Rosuvastatin]   . Lipitor [Atorvastatin]   . Penicillins   . Sulfa Antibiotics   . Zetia [Ezetimibe]   . Zocor [Simvastatin]     ROS: A comprehensive review of systems was negative.  HOME MEDS: Current Outpatient Prescriptions  Medication Sig Dispense  Refill  . cephALEXin (KEFLEX) 500 MG capsule Take 500 mg by mouth daily.    . clopidogrel (PLAVIX) 75 MG tablet Take 1 tablet (75 mg total) by mouth daily. PLEASE KEEP UPCOMING APPOINTMENT 90 tablet 0  . lisinopril (PRINIVIL,ZESTRIL) 5 MG tablet TAKE ONE TABLET BY MOUTH ONE TIME DAILY 30 tablet 11  . Lutein 20 MG CAPS Take 1 capsule by mouth daily.    . metoprolol tartrate (LOPRESSOR) 25 MG tablet Take 12.5 mg by mouth 2 (two) times daily.    . pravastatin (PRAVACHOL) 40 MG tablet Take 40 mg by mouth as directed. Take 1-2 times weekly.    . Saw Palmetto 450 MG CAPS Take 1 capsule by mouth daily.    . vitamin C (ASCORBIC ACID) 500 MG tablet Take 500 mg by mouth daily.     No current facility-administered medications for this visit.    LABS/IMAGING: No results found for this or any previous visit (from the past 48 hour(s)). No results found.  VITALS: BP 132/84 mmHg  Pulse 56  Ht 6\' 3"  (1.905 m)  Wt 214 lb (97.07 kg)  BMI 26.75 kg/m2  EXAM: General appearance: alert and no distress Neck: no carotid bruit and no JVD Lungs: clear to auscultation bilaterally Heart: regular rate and rhythm, S1, S2 normal, no murmur, click, rub or gallop Abdomen: soft, non-tender; bowel sounds normal; no masses,  no organomegaly Extremities: extremities normal, atraumatic, no cyanosis or edema Pulses: 2+ and symmetric Skin: Skin color, texture, turgor normal. No rashes or lesions Neurologic: Grossly normal Psych: Mood, affect normal  EKG: Sinus bradycardia 56  ASSESSMENT: 1. Coronary artery disease status post PCI to the ostial RCA in 1999, proximal diagonal and circumflex marginal in 2009 2. Dyslipidemia-intolerant to statins and Zetia (on pravastatin twice weekly) 3. Hypertension-controlled 4. Elevated PSA - due to prostatitis  PLAN: 1.   [name] is doing well without any chest pain or worsening shortness of breath. His cholesterol was elevated but he's currently taking pravastatin twice  weekly. We'll plan to recheck that today. His blood pressure is at goal. Plan follow-up annually but I'll be in contact with results of his cholesterol studies.   Pixie Casino, MD, [facility] Attending Cardiologist [place] 08/13/2015, 5:15 PM

## 2015-08-13 NOTE — Patient Instructions (Signed)
Your physician wants you to follow-up in: 1 year with Dr. Hilty. You will receive a reminder letter in the mail two months in advance. If you don't receive a letter, please call our office to schedule the follow-up appointment.  Your physician recommends that you return for lab work FASTING   

## 2015-09-23 ENCOUNTER — Other Ambulatory Visit: Payer: Self-pay | Admitting: Internal Medicine

## 2015-09-23 NOTE — Telephone Encounter (Signed)
REFILL 

## 2015-09-24 ENCOUNTER — Telehealth: Payer: Self-pay | Admitting: *Deleted

## 2015-09-24 ENCOUNTER — Other Ambulatory Visit: Payer: Self-pay | Admitting: Internal Medicine

## 2015-09-24 LAB — LIPID PANEL
Chol/HDL Ratio: 7.5 ratio units — ABNORMAL HIGH (ref 0.0–5.0)
Cholesterol, Total: 217 mg/dL — ABNORMAL HIGH (ref 100–199)
HDL: 29 mg/dL — ABNORMAL LOW (ref >39)
LDL Calculated: 155 mg/dL — ABNORMAL HIGH (ref 0–99)
Triglycerides: 165 mg/dL — ABNORMAL HIGH (ref 0–149)
VLDL Cholesterol Cal: 33 mg/dL (ref 5–40)

## 2015-09-24 MED ORDER — LISINOPRIL 5 MG PO TABS: 5.0000 mg | ORAL_TABLET | Freq: Every day | ORAL | Status: DC

## 2015-09-24 NOTE — Telephone Encounter (Signed)
-----   Message from Pixie Casino, MD sent at 09/24/2015  8:31 AM EDT ----- Cholesterol is again elevated. Is he taking pravachol? How often.  Dr. Lemmie Evens

## 2015-09-24 NOTE — Telephone Encounter (Signed)
Spoke to patient. Result given . Verbalized understanding  Patient states in still takes pravastatin  One tablet weekly    patient aware--- will defer to Dr Debara Pickett

## 2015-09-27 ENCOUNTER — Telehealth: Payer: Self-pay | Admitting: *Deleted

## 2015-09-27 NOTE — Telephone Encounter (Signed)
-----   Message from Pixie Casino, MD sent at 09/24/2015  5:23 PM EDT ----- He should consider taking it more than once a week for more benefit if tolerated.  Dr. Lemmie Evens

## 2015-09-27 NOTE — Telephone Encounter (Signed)
Spoke to patient. Result given . Verbalized understanding PATIENT STATES HE WILL TRY

## 2015-09-27 NOTE — Telephone Encounter (Signed)
LEFT MESSAGE TO CALL BACK - IN REGARDS TO ABNORMAL LAB

## 2016-02-17 DIAGNOSIS — R972 Elevated prostate specific antigen [PSA]: Secondary | ICD-10-CM | POA: Diagnosis not present

## 2016-02-24 DIAGNOSIS — R972 Elevated prostate specific antigen [PSA]: Secondary | ICD-10-CM | POA: Diagnosis not present

## 2016-02-24 DIAGNOSIS — N419 Inflammatory disease of prostate, unspecified: Secondary | ICD-10-CM | POA: Diagnosis not present

## 2016-03-22 DIAGNOSIS — L820 Inflamed seborrheic keratosis: Secondary | ICD-10-CM | POA: Diagnosis not present

## 2016-07-03 HISTORY — PX: MELANOMA EXCISION: SHX5266

## 2016-08-15 DIAGNOSIS — R972 Elevated prostate specific antigen [PSA]: Secondary | ICD-10-CM | POA: Diagnosis not present

## 2016-08-21 ENCOUNTER — Encounter: Payer: Self-pay | Admitting: Internal Medicine

## 2016-08-21 ENCOUNTER — Ambulatory Visit (INDEPENDENT_AMBULATORY_CARE_PROVIDER_SITE_OTHER): Payer: Federal, State, Local not specified - PPO | Admitting: Internal Medicine

## 2016-08-21 VITALS — BP 118/82 | HR 52 | Ht 77.0 in | Wt 216.0 lb

## 2016-08-21 DIAGNOSIS — E782 Mixed hyperlipidemia: Secondary | ICD-10-CM

## 2016-08-21 DIAGNOSIS — N419 Inflammatory disease of prostate, unspecified: Secondary | ICD-10-CM | POA: Diagnosis not present

## 2016-08-21 DIAGNOSIS — I10 Essential (primary) hypertension: Secondary | ICD-10-CM

## 2016-08-21 DIAGNOSIS — R972 Elevated prostate specific antigen [PSA]: Secondary | ICD-10-CM | POA: Diagnosis not present

## 2016-08-21 DIAGNOSIS — Z789 Other specified health status: Secondary | ICD-10-CM | POA: Diagnosis not present

## 2016-08-21 DIAGNOSIS — E785 Hyperlipidemia, unspecified: Secondary | ICD-10-CM | POA: Insufficient documentation

## 2016-08-21 DIAGNOSIS — I2510 Atherosclerotic heart disease of native coronary artery without angina pectoris: Secondary | ICD-10-CM

## 2016-08-21 NOTE — Patient Instructions (Addendum)
Your physician recommends that you return for lab work FASTING to check cholesterol   Your physician wants you to follow-up in: ONE YEAR with Dr. Hilty. You will receive a reminder letter in the mail two months in advance. If you don't receive a letter, please call our office to schedule the follow-up appointment.   

## 2016-08-21 NOTE — Progress Notes (Signed)
OFFICE NOTE  Chief Complaint:  No complaints  Primary Care Physician: Sherrie Mustache, MD  HPI:  William Jefferson is a pleasant 71 year old male formerly followed by Dr. Rollene Fare. He is here today to establish new cardiac care. His past medical history is significant for coronary artery disease. In 1999 he was having symptoms of angina and presented to her primary care physician who performed an EKG and sent him to the emergency room. There he will have angiography and was found to have a ostial right coronary artery lesion of a very large vessel. He ultimately had placement of an iliac stent, as no coronary stents of that size were available at the time. This is actually stayed very patent for long period of time. In 2009 he had another episode of chest pain and was found to have circumflex and diagonal lesions which were stented with drug-eluting stents. The OM1 was stented with a 2 5 x 10 mm Promus stent that was postdilated to 3.0 mm.  The diagonal was a 2 5 x 18 mm Promus stent that there was also postdilated to 2.75 mm. He's done well since then without any chest pain complaints. 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 He denies any chest pain or shortness of breath. Unfortunately been intolerant to all statins and Zetia. He is unwilling to take any more of those medicines as is found in its cause leg pain, weakness and difficulty performing his job. He is on monotherapy Plavix, but not aspirin, but denies any history of allergy aspirin. His only other issue is a rising PSA. He said a number of prostate biopsies and actually developed an infection at one time. He's had atypical cells but no determinant prostate cancer.  I saw William Jefferson back today. He continues to be without complaints. 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 He does have a history of being intolerant to at  least 5 or 6 different statins. He's not as cholesterol checked in about 4 years. We discussed the new medications that are now out to treat cholesterol a said that he be willing to try at least a low-dose statin is to be on a qualifying dose of max tolerated therapy. We would either have to recheck his cholesterol level.  William Jefferson returns today for follow-up. Overall he is doing well and is asymptomatic. He is started taking pravastatin 1-2 times a week which is certainly better than nothing. He's been intolerant of all other statins including Zetia. He's managed to lose a couple pounds but generally hovers around 215. Blood pressure is well-controlled.  08/21/2016  William Jefferson is seen today in follow-up. Since I last saw him he had tried to take pravastatin as he's been intolerant to Crestor 20 mg, Lipitor 40 mg, Zetia 10 mg and Zocor 40 mg doses in the past due to myalgias. He was placed on pravastatin 40 mg with additional symptoms and decreased it to 3 times weekly and eventually stopped the medication. He continues to have elevated cholesterol with LDL-C greater than 150. He has known coronary artery disease with prior PCI in 1999 and again in 2009. We discussed today additional treatment options including PC SK 9 inhibitors and potentially enrolling him in the Orion-10 trial.  PMHx:  Past Medical History:  Diagnosis Date  . Bladder stone    removed in 2014 by  Dr. Jeffie Pollock  . Coronary artery disease   . Hyperlipidemia    statin intolerance  . Hypertension     Past Surgical History:  Procedure Laterality Date  . CARDIAC CATHETERIZATION  06/10/1998   large P150 iliac stent to RCA - 3.5x38mm NIR stent (Dr. Marella Chimes)  . CARDIAC CATHETERIZATION  07/16/2007   mid-distal LAD stenosis, normal LV function, 2.5x33mm Promus stent to obtuse marginal, 2.5x24mm Promus stent to osital & proximal diagonal (Dr. Marella Chimes, Dr. Domenic Moras)  . NM MYOCAR PERF WALL MOTION  08/2008   bruce myoview; normal  pattern of perfusion, EF 77%  . TRANSTHORACIC ECHOCARDIOGRAM  08/2012   mild conc LVH    FAMHx:  History reviewed. No pertinent family history.  SOCHx:   reports that he quit smoking about 19 years ago. He has never used smokeless tobacco. His alcohol and drug histories are not on file.  ALLERGIES:  Allergies  Allergen Reactions  . Crestor [Rosuvastatin]   . Lipitor [Atorvastatin]   . Penicillins   . Sulfa Antibiotics   . Zetia [Ezetimibe]   . Zocor [Simvastatin]     ROS: Pertinent items noted in HPI and remainder of comprehensive ROS otherwise negative.  HOME MEDS: Current Outpatient Prescriptions  Medication Sig Dispense Refill  . cephALEXin (KEFLEX) 500 MG capsule Take 500 mg by mouth daily.    . clopidogrel (PLAVIX) 75 MG tablet TAKE 1 TABLET BY MOUTH  DAILY. PLEASE KEEP UPCOMING APPOINTMENT 90 tablet 3  . lisinopril (PRINIVIL,ZESTRIL) 5 MG tablet Take 1 tablet (5 mg total) by mouth daily. 30 tablet 11  . Lutein 20 MG CAPS Take 1 capsule by mouth daily.    . metoprolol tartrate (LOPRESSOR) 25 MG tablet Take 12.5 mg by mouth 2 (two) times daily.    . Red Yeast Rice 600 MG CAPS Take 600 mg by mouth daily.    . Saw Palmetto 450 MG CAPS Take 1 capsule by mouth daily.    . vitamin C (ASCORBIC ACID) 500 MG tablet Take 500 mg by mouth daily.     No current facility-administered medications for this visit.     LABS/IMAGING: No results found for this or any previous visit (from the past 48 hour(s)). No results found.  VITALS: BP 118/82   Pulse (!) 52   Ht 6\' 5"  (1.956 m)   Wt 216 lb (98 kg)   BMI 25.61 kg/m   EXAM: General appearance: alert and no distress Neck: no carotid bruit and no JVD Lungs: clear to auscultation bilaterally Heart: regular rate and rhythm, S1, S2 normal, no murmur, click, rub or gallop Abdomen: soft, non-tender; bowel sounds normal; no masses,  no organomegaly Extremities: extremities normal, atraumatic, no cyanosis or edema Pulses: 2+ and  symmetric Skin: Skin color, texture, turgor normal. No rashes or lesions Neurologic: Grossly normal Psych: Mood, affect normal  EKG: Sinus bradycardia 52  ASSESSMENT: 1. Coronary artery disease status post PCI to the ostial RCA in 1999, proximal diagonal and circumflex marginal in 2009 2. Dyslipidemia-intolerant to statins and Zetia 3. Hypertension-controlled 4. Elevated PSA - due to prostatitis  PLAN: 1.   William Jefferson is doing well without any chest pain or worsening shortness of breath. He has persistent dyslipidemia and intolerance to several highly active statins as well as ezetimibe. He is currently only taking red yeast rice over-the-counter. We'll go ahead and recheck a lipid NMR to fully assess his risk. I believe he is a good candidate for the Orion 10 trial and  he expressed interest in that today. We will recheck to him for that. Should he decide not to pursue that, would strongly consider evaluation for PC SK 9 therapy. He has some reservations including disinterest and giving himself injections. He felt that getting injections during the trial and the fact that the treatments are much less frequent than once monthly was more tolerable to him. I did mention that Repatha could be dosed once monthly with an infusion pump and that may be the best tolerated option for him.  Pixie Casino, MD, [facility] Attending Cardiologist [address] [place] 08/21/2016, 6:59 PM

## 2016-08-31 ENCOUNTER — Other Ambulatory Visit: Payer: Self-pay | Admitting: Internal Medicine

## 2016-08-31 DIAGNOSIS — D034 Melanoma in situ of scalp and neck: Secondary | ICD-10-CM | POA: Diagnosis not present

## 2016-08-31 DIAGNOSIS — L720 Epidermal cyst: Secondary | ICD-10-CM | POA: Diagnosis not present

## 2016-08-31 DIAGNOSIS — E785 Hyperlipidemia, unspecified: Secondary | ICD-10-CM | POA: Diagnosis not present

## 2016-08-31 DIAGNOSIS — L821 Other seborrheic keratosis: Secondary | ICD-10-CM | POA: Diagnosis not present

## 2016-09-01 LAB — NMR LIPOPROF + GRAPH
Cholesterol: 192 mg/dL (ref 100–199)
HDL Cholesterol by NMR: 31 mg/dL — ABNORMAL LOW (ref >39)
HDL PARTICLE NUMBER: 24.5 umol/L — AB (ref >=30.5)
LDL Particle Number: 1974 nmol/L — ABNORMAL HIGH (ref <1000)
LDL Size: 20.3 nm (ref >20.5)
LDL-C: 144 mg/dL — ABNORMAL HIGH (ref 0–99)
LP-IR SCORE: 64 — AB (ref <=45)
Small LDL Particle Number: 1076 nmol/L — ABNORMAL HIGH (ref <=527)
Triglycerides by NMR: 87 mg/dL (ref 0–149)

## 2016-09-07 DIAGNOSIS — D034 Melanoma in situ of scalp and neck: Secondary | ICD-10-CM | POA: Diagnosis not present

## 2016-09-20 ENCOUNTER — Other Ambulatory Visit: Payer: Self-pay

## 2016-09-21 DIAGNOSIS — D034 Melanoma in situ of scalp and neck: Secondary | ICD-10-CM | POA: Diagnosis not present

## 2016-09-25 ENCOUNTER — Other Ambulatory Visit: Payer: Self-pay

## 2016-09-25 MED ORDER — LISINOPRIL 5 MG PO TABS: 5.0000 mg | ORAL_TABLET | Freq: Every day | ORAL | 1 refills | Status: DC

## 2016-10-26 DIAGNOSIS — C434 Malignant melanoma of scalp and neck: Secondary | ICD-10-CM | POA: Diagnosis not present

## 2016-11-10 ENCOUNTER — Other Ambulatory Visit: Payer: Self-pay | Admitting: Internal Medicine

## 2016-12-21 ENCOUNTER — Other Ambulatory Visit: Payer: Self-pay | Admitting: Internal Medicine

## 2016-12-28 DIAGNOSIS — Z08 Encounter for follow-up examination after completed treatment for malignant neoplasm: Secondary | ICD-10-CM | POA: Diagnosis not present

## 2016-12-28 DIAGNOSIS — Z1283 Encounter for screening for malignant neoplasm of skin: Secondary | ICD-10-CM | POA: Diagnosis not present

## 2016-12-28 DIAGNOSIS — Z85820 Personal history of malignant melanoma of skin: Secondary | ICD-10-CM | POA: Diagnosis not present

## 2017-02-15 DIAGNOSIS — R972 Elevated prostate specific antigen [PSA]: Secondary | ICD-10-CM | POA: Diagnosis not present

## 2017-02-22 DIAGNOSIS — N419 Inflammatory disease of prostate, unspecified: Secondary | ICD-10-CM | POA: Diagnosis not present

## 2017-02-22 DIAGNOSIS — N400 Enlarged prostate without lower urinary tract symptoms: Secondary | ICD-10-CM | POA: Diagnosis not present

## 2017-02-22 DIAGNOSIS — R972 Elevated prostate specific antigen [PSA]: Secondary | ICD-10-CM | POA: Diagnosis not present

## 2017-03-15 DIAGNOSIS — Z85820 Personal history of malignant melanoma of skin: Secondary | ICD-10-CM | POA: Diagnosis not present

## 2017-03-15 DIAGNOSIS — Z08 Encounter for follow-up examination after completed treatment for malignant neoplasm: Secondary | ICD-10-CM | POA: Diagnosis not present

## 2017-03-15 DIAGNOSIS — Z1283 Encounter for screening for malignant neoplasm of skin: Secondary | ICD-10-CM | POA: Diagnosis not present

## 2017-03-15 DIAGNOSIS — L821 Other seborrheic keratosis: Secondary | ICD-10-CM | POA: Diagnosis not present

## 2017-05-30 DIAGNOSIS — Z1159 Encounter for screening for other viral diseases: Secondary | ICD-10-CM | POA: Diagnosis not present

## 2017-05-30 DIAGNOSIS — R6889 Other general symptoms and signs: Secondary | ICD-10-CM | POA: Diagnosis not present

## 2017-05-30 DIAGNOSIS — J019 Acute sinusitis, unspecified: Secondary | ICD-10-CM | POA: Diagnosis not present

## 2017-06-14 DIAGNOSIS — L814 Other melanin hyperpigmentation: Secondary | ICD-10-CM | POA: Diagnosis not present

## 2017-06-14 DIAGNOSIS — L820 Inflamed seborrheic keratosis: Secondary | ICD-10-CM | POA: Diagnosis not present

## 2017-06-14 DIAGNOSIS — D225 Melanocytic nevi of trunk: Secondary | ICD-10-CM | POA: Diagnosis not present

## 2017-06-14 DIAGNOSIS — X32XXXD Exposure to sunlight, subsequent encounter: Secondary | ICD-10-CM | POA: Diagnosis not present

## 2017-06-14 DIAGNOSIS — L570 Actinic keratosis: Secondary | ICD-10-CM | POA: Diagnosis not present

## 2017-06-14 DIAGNOSIS — Z08 Encounter for follow-up examination after completed treatment for malignant neoplasm: Secondary | ICD-10-CM | POA: Diagnosis not present

## 2017-06-14 DIAGNOSIS — Z1283 Encounter for screening for malignant neoplasm of skin: Secondary | ICD-10-CM | POA: Diagnosis not present

## 2017-06-14 DIAGNOSIS — Z85820 Personal history of malignant melanoma of skin: Secondary | ICD-10-CM | POA: Diagnosis not present

## 2017-06-14 DIAGNOSIS — L821 Other seborrheic keratosis: Secondary | ICD-10-CM | POA: Diagnosis not present

## 2017-08-03 ENCOUNTER — Encounter: Payer: Self-pay | Admitting: Internal Medicine

## 2017-08-03 ENCOUNTER — Ambulatory Visit (INDEPENDENT_AMBULATORY_CARE_PROVIDER_SITE_OTHER): Payer: Federal, State, Local not specified - PPO | Admitting: Internal Medicine

## 2017-08-03 VITALS — BP 120/62 | HR 56 | Ht 77.0 in | Wt 212.2 lb

## 2017-08-03 DIAGNOSIS — E782 Mixed hyperlipidemia: Secondary | ICD-10-CM

## 2017-08-03 DIAGNOSIS — I10 Essential (primary) hypertension: Secondary | ICD-10-CM | POA: Diagnosis not present

## 2017-08-03 DIAGNOSIS — Z789 Other specified health status: Secondary | ICD-10-CM

## 2017-08-03 DIAGNOSIS — I2510 Atherosclerotic heart disease of native coronary artery without angina pectoris: Secondary | ICD-10-CM | POA: Diagnosis not present

## 2017-08-03 NOTE — Patient Instructions (Signed)
Your physician wants you to follow-up in: 1 year with Dr. Hilty. You will receive a reminder letter in the mail two months in advance. If you don't receive a letter, please call our office to schedule the follow-up appointment.  

## 2017-08-03 NOTE — Progress Notes (Signed)
OFFICE NOTE  Chief Complaint:  No complaints  Primary Care Physician: Dione Housekeeper, MD  HPI:  William Jefferson is a pleasant 72 year old male formerly followed by Dr. Rollene Fare. He is here today to establish new cardiac care. His past medical history is significant for coronary artery disease. In 1999 he was having symptoms of angina and presented to her primary care physician who performed an EKG and sent him to the emergency room. There he will have angiography and was found to have a ostial right coronary artery lesion of a very large vessel. He ultimately had placement of an iliac stent, as no coronary stents of that size were available at the time. This is actually stayed very patent for long period of time. In 2009 he had another episode of chest pain and was found to have circumflex and diagonal lesions which were stented with drug-eluting stents. The OM1 was stented with a 2 5 x 10 mm Promus stent that was postdilated to 3.0 mm.  The diagonal was a 2 5 x 18 mm Promus stent that there was also postdilated to 2.75 mm. He's done well since then without any chest pain complaints. 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 He denies any chest pain or shortness of breath. Unfortunately been intolerant to all statins and Zetia. He is unwilling to take any more of those medicines as is found in its cause leg pain, weakness and difficulty performing his job. He is on monotherapy Plavix, but not aspirin, but denies any history of allergy aspirin. His only other issue is a rising PSA. He said a number of prostate biopsies and actually developed an infection at one time. He's had atypical cells but no determinant prostate cancer.  I saw William Jefferson back today. He continues to be without complaints. 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 He does have a history of being intolerant to at least 5  or 6 different statins. He's not as cholesterol checked in about 4 years. We discussed the new medications that are now out to treat cholesterol a said that he be willing to try at least a low-dose statin is to be on a qualifying dose of max tolerated therapy. We would either have to recheck his cholesterol level.  William Jefferson returns today for follow-up. Overall he is doing well and is asymptomatic. He is started taking pravastatin 1-2 times a week which is certainly better than nothing. He's been intolerant of all other statins including Zetia. He's managed to lose a couple pounds but generally hovers around 215. Blood pressure is well-controlled.  08/21/2016  William Jefferson is seen today in follow-up. Since I last saw him he had tried to take pravastatin as he's been intolerant to Crestor 20 mg, Lipitor 40 mg, Zetia 10 mg and Zocor 40 mg doses in the past due to myalgias. He was placed on pravastatin 40 mg with additional symptoms and decreased it to 3 times weekly and eventually stopped the medication. He continues to have elevated cholesterol with LDL-C greater than 150. He has known coronary artery disease with prior PCI in 1999 and again in 2009. We discussed today additional treatment options including PC SK 9 inhibitors and potentially enrolling him in the Orion-10 trial.  08/03/2017  William Jefferson was seen today in follow-up.  Overall he seems to be doing well.  Unfortunately  he has been intolerant to number statins.  He declined the [employer] 10 trial.  We discussed the PCS K9 inhibitors today however is concerned about the injectables.  He asked about what pill options may be available.  I mentioned that he may be a candidate for the CLEAR trial.  He said he may be interested in this.  PMHx:  Past Medical History:  Diagnosis Date  . Bladder stone    removed in 2014 by Dr. Jeffie Pollock  . Coronary artery disease   . Hyperlipidemia    statin intolerance  . Hypertension     Past Surgical History:    Procedure Laterality Date  . CARDIAC CATHETERIZATION  06/10/1998   large P150 iliac stent to RCA - 3.5x13mm NIR stent (Dr. Marella Chimes)  . CARDIAC CATHETERIZATION  07/16/2007   mid-distal LAD stenosis, normal LV function, 2.5x52mm Promus stent to obtuse marginal, 2.5x71mm Promus stent to osital & proximal diagonal (Dr. Marella Chimes, Dr. Domenic Moras)  . NM MYOCAR PERF WALL MOTION  08/2008   bruce myoview; normal pattern of perfusion, EF 77%  . TRANSTHORACIC ECHOCARDIOGRAM  08/2012   mild conc LVH    FAMHx:  No family history on file.  SOCHx:   reports that he quit smoking about 20 years ago. he has never used smokeless tobacco. His alcohol and drug histories are not on file.  ALLERGIES:  Allergies  Allergen Reactions  . Crestor [Rosuvastatin]   . Lipitor [Atorvastatin]   . Penicillins   . Sulfa Antibiotics   . Zetia [Ezetimibe]   . Zocor [Simvastatin]     ROS: Pertinent items noted in HPI and remainder of comprehensive ROS otherwise negative.  HOME MEDS: Current Outpatient Medications  Medication Sig Dispense Refill  . cephALEXin (KEFLEX) 500 MG capsule Take 500 mg by mouth daily.    . clopidogrel (PLAVIX) 75 MG tablet Take 1 Tablet by mouth once daily 90 tablet 3  . lisinopril (PRINIVIL,ZESTRIL) 5 MG tablet Take 1 Tablet by mouth once daily 90 tablet 1  . Lutein 20 MG CAPS Take 1 capsule by mouth daily.    . metoprolol tartrate (LOPRESSOR) 25 MG tablet Take 12.5 mg by mouth 2 (two) times daily.    . Saw Palmetto 450 MG CAPS Take 2 capsules by mouth daily.     . vitamin C (ASCORBIC ACID) 500 MG tablet Take 500 mg by mouth daily.     No current facility-administered medications for this visit.     LABS/IMAGING: No results found for this or any previous visit (from the past 48 hour(s)). No results found.  VITALS: BP 120/62   Pulse (!) 56   Ht 6\' 5"  (1.956 m)   Wt 212 lb 3.2 oz (96.3 kg)   BMI 25.16 kg/m   EXAM: General appearance: alert and no distress Neck: no  carotid bruit and no JVD Lungs: clear to auscultation bilaterally Heart: regular rate and rhythm, S1, S2 normal, no murmur, click, rub or gallop Abdomen: soft, non-tender; bowel sounds normal; no masses,  no organomegaly Extremities: extremities normal, atraumatic, no cyanosis or edema Pulses: 2+ and symmetric Skin: Skin color, texture, turgor normal. No rashes or lesions Neurologic: Grossly normal Psych: Mood, affect normal  EKG: Sinus bradycardia 56-personally reviewed  ASSESSMENT: 1. Coronary artery disease status post PCI to the ostial RCA in 1999, proximal diagonal and circumflex marginal in 2009 2. Dyslipidemia-intolerant to statins and Zetia 3. Hypertension-controlled 4. Elevated PSA - due to prostatitis  PLAN: 1.   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  He is not interested in [facility] SK 9 inhibitors due to concerns about injections.  He inquired today about what other oral treatments might be available.  I mentioned that we are studying bempadoic acid - which is the CLEAR trial. He may be interested in enrolling. I will reach out to the trial coordinators.  Follow-up annually or sooner as necessary.  Pixie Casino, MD, [facility] [place] Director of the Advanced Lipid Disorders &  Cardiovascular Risk Reduction Clinic Diplomate of the American Board of Clinical Lipidology Attending Cardiologist  Direct Dial: [phone]  Fax: [phone]  Website:  [email or web address] 08/03/2017, 9:27 AM

## 2017-08-10 ENCOUNTER — Encounter: Payer: Self-pay | Admitting: *Deleted

## 2017-08-10 DIAGNOSIS — Z006 Encounter for examination for normal comparison and control in clinical research program: Secondary | ICD-10-CM

## 2017-08-10 NOTE — Progress Notes (Signed)
CLEAR Research Study  All elements of the informed consent form,study requirements and expectations were reviewed with the subject. All questions and concerns were identified and addressed prior to the signing of the consent. No procedures were performed prior to consenting the subject. The subject was given an adequate amount of time to make an informed decision. A copy of the consent was provided to the patient to take home.  Subject consented to [place] version 5.1, 28Aug2018 Local version [id number]

## 2017-08-20 ENCOUNTER — Other Ambulatory Visit: Payer: Self-pay | Admitting: *Deleted

## 2017-08-20 ENCOUNTER — Encounter: Payer: Self-pay | Admitting: *Deleted

## 2017-08-20 DIAGNOSIS — Z006 Encounter for examination for normal comparison and control in clinical research program: Secondary | ICD-10-CM

## 2017-08-20 MED ORDER — AMBULATORY NON FORMULARY MEDICATION: 180.0000 mg | Freq: Every day | Status: DC

## 2017-08-20 NOTE — Progress Notes (Signed)
Subject to clinic for S2-W4 in the CLEAR research study.  No c/o, aes or saes to report.  Study drug dispensed and subject begins run in phase of the trial.  Next appointment scheduled for March 21,2019 @0830 .

## 2017-09-13 DIAGNOSIS — C44329 Squamous cell carcinoma of skin of other parts of face: Secondary | ICD-10-CM | POA: Diagnosis not present

## 2017-09-13 DIAGNOSIS — L820 Inflamed seborrheic keratosis: Secondary | ICD-10-CM | POA: Diagnosis not present

## 2017-09-13 DIAGNOSIS — Z85820 Personal history of malignant melanoma of skin: Secondary | ICD-10-CM | POA: Diagnosis not present

## 2017-09-13 DIAGNOSIS — L570 Actinic keratosis: Secondary | ICD-10-CM | POA: Diagnosis not present

## 2017-09-13 DIAGNOSIS — Z1283 Encounter for screening for malignant neoplasm of skin: Secondary | ICD-10-CM | POA: Diagnosis not present

## 2017-09-13 DIAGNOSIS — Z08 Encounter for follow-up examination after completed treatment for malignant neoplasm: Secondary | ICD-10-CM | POA: Diagnosis not present

## 2017-09-13 DIAGNOSIS — D225 Melanocytic nevi of trunk: Secondary | ICD-10-CM | POA: Diagnosis not present

## 2017-09-13 DIAGNOSIS — X32XXXD Exposure to sunlight, subsequent encounter: Secondary | ICD-10-CM | POA: Diagnosis not present

## 2017-09-20 ENCOUNTER — Encounter: Payer: Self-pay | Admitting: *Deleted

## 2017-09-20 DIAGNOSIS — Z006 Encounter for examination for normal comparison and control in clinical research program: Secondary | ICD-10-CM

## 2017-09-20 NOTE — Progress Notes (Signed)
Subject to research clinic for visit T1-W0 in the Clear Research study.  No c/o, aes or saes to report. 97% compliant with run in medications, randomized and new study drug dispensed.  Next appointment  Scheduled.

## 2017-10-18 ENCOUNTER — Encounter: Payer: Self-pay | Admitting: *Deleted

## 2017-10-18 DIAGNOSIS — Z006 Encounter for examination for normal comparison and control in clinical research program: Secondary | ICD-10-CM

## 2017-10-18 NOTE — Progress Notes (Signed)
Subject to research clinic for visit t2-M1 in the Clear research study.  100% compliant with meds. No cos, aes or saes to report.  Next clinic visit scheduled.  Re-consented to [facility] [place] version 6.0.

## 2017-10-30 ENCOUNTER — Other Ambulatory Visit: Payer: Self-pay | Admitting: Internal Medicine

## 2017-10-30 NOTE — Telephone Encounter (Signed)
Rx(s) sent to pharmacy electronically.  

## 2017-11-29 ENCOUNTER — Other Ambulatory Visit: Payer: Self-pay

## 2017-11-29 MED ORDER — CLOPIDOGREL BISULFATE 75 MG PO TABS: 75.0000 mg | ORAL_TABLET | Freq: Every day | ORAL | 3 refills | Status: DC

## 2017-12-27 DIAGNOSIS — Z85820 Personal history of malignant melanoma of skin: Secondary | ICD-10-CM | POA: Diagnosis not present

## 2017-12-27 DIAGNOSIS — X32XXXD Exposure to sunlight, subsequent encounter: Secondary | ICD-10-CM | POA: Diagnosis not present

## 2017-12-27 DIAGNOSIS — Z1283 Encounter for screening for malignant neoplasm of skin: Secondary | ICD-10-CM | POA: Diagnosis not present

## 2017-12-27 DIAGNOSIS — Z85828 Personal history of other malignant neoplasm of skin: Secondary | ICD-10-CM | POA: Diagnosis not present

## 2017-12-27 DIAGNOSIS — Z08 Encounter for follow-up examination after completed treatment for malignant neoplasm: Secondary | ICD-10-CM | POA: Diagnosis not present

## 2017-12-27 DIAGNOSIS — L570 Actinic keratosis: Secondary | ICD-10-CM | POA: Diagnosis not present

## 2018-02-28 DIAGNOSIS — R972 Elevated prostate specific antigen [PSA]: Secondary | ICD-10-CM | POA: Diagnosis not present

## 2018-03-07 DIAGNOSIS — Z87442 Personal history of urinary calculi: Secondary | ICD-10-CM | POA: Diagnosis not present

## 2018-03-07 DIAGNOSIS — N419 Inflammatory disease of prostate, unspecified: Secondary | ICD-10-CM | POA: Diagnosis not present

## 2018-03-07 DIAGNOSIS — N4232 Atypical small acinar proliferation of prostate: Secondary | ICD-10-CM | POA: Diagnosis not present

## 2018-03-07 DIAGNOSIS — R972 Elevated prostate specific antigen [PSA]: Secondary | ICD-10-CM | POA: Diagnosis not present

## 2018-03-22 VITALS — BP 125/85 | HR 69 | Wt 214.0 lb

## 2018-03-22 DIAGNOSIS — Z006 Encounter for examination for normal comparison and control in clinical research program: Secondary | ICD-10-CM

## 2018-03-22 NOTE — Research (Signed)
[name] to research clinic for visit [phone]  in the Clear study.  No complaints, adverse events, or serious adverse events to report.  Subject 99% compliant with medications and new medication dispensed.  Next appointment scheduled.

## 2018-03-26 ENCOUNTER — Other Ambulatory Visit: Payer: Self-pay | Admitting: Internal Medicine

## 2018-03-28 DIAGNOSIS — Z08 Encounter for follow-up examination after completed treatment for malignant neoplasm: Secondary | ICD-10-CM | POA: Diagnosis not present

## 2018-03-28 DIAGNOSIS — C4441 Basal cell carcinoma of skin of scalp and neck: Secondary | ICD-10-CM | POA: Diagnosis not present

## 2018-03-28 DIAGNOSIS — Z1283 Encounter for screening for malignant neoplasm of skin: Secondary | ICD-10-CM | POA: Diagnosis not present

## 2018-03-28 DIAGNOSIS — X32XXXD Exposure to sunlight, subsequent encounter: Secondary | ICD-10-CM | POA: Diagnosis not present

## 2018-03-28 DIAGNOSIS — Z85820 Personal history of malignant melanoma of skin: Secondary | ICD-10-CM | POA: Diagnosis not present

## 2018-03-28 DIAGNOSIS — L570 Actinic keratosis: Secondary | ICD-10-CM | POA: Diagnosis not present

## 2018-03-28 DIAGNOSIS — Z85828 Personal history of other malignant neoplasm of skin: Secondary | ICD-10-CM | POA: Diagnosis not present

## 2018-05-09 DIAGNOSIS — L820 Inflamed seborrheic keratosis: Secondary | ICD-10-CM | POA: Diagnosis not present

## 2018-05-09 DIAGNOSIS — Z08 Encounter for follow-up examination after completed treatment for malignant neoplasm: Secondary | ICD-10-CM | POA: Diagnosis not present

## 2018-05-09 DIAGNOSIS — Z85828 Personal history of other malignant neoplasm of skin: Secondary | ICD-10-CM | POA: Diagnosis not present

## 2018-06-13 DIAGNOSIS — R972 Elevated prostate specific antigen [PSA]: Secondary | ICD-10-CM | POA: Diagnosis not present

## 2018-06-20 DIAGNOSIS — R972 Elevated prostate specific antigen [PSA]: Secondary | ICD-10-CM | POA: Diagnosis not present

## 2018-06-20 DIAGNOSIS — Z87442 Personal history of urinary calculi: Secondary | ICD-10-CM | POA: Diagnosis not present

## 2018-07-18 ENCOUNTER — Other Ambulatory Visit: Payer: Self-pay | Admitting: Cardiovascular Disease

## 2018-07-18 NOTE — Telephone Encounter (Signed)
Rx request sent to pharmacy.  

## 2018-08-08 ENCOUNTER — Ambulatory Visit (INDEPENDENT_AMBULATORY_CARE_PROVIDER_SITE_OTHER): Payer: Federal, State, Local not specified - PPO | Admitting: Internal Medicine

## 2018-08-08 ENCOUNTER — Encounter: Payer: Self-pay | Admitting: Internal Medicine

## 2018-08-08 VITALS — BP 119/77 | HR 64 | Ht 77.0 in | Wt 213.0 lb

## 2018-08-08 DIAGNOSIS — I2510 Atherosclerotic heart disease of native coronary artery without angina pectoris: Secondary | ICD-10-CM

## 2018-08-08 DIAGNOSIS — Z08 Encounter for follow-up examination after completed treatment for malignant neoplasm: Secondary | ICD-10-CM | POA: Diagnosis not present

## 2018-08-08 DIAGNOSIS — E782 Mixed hyperlipidemia: Secondary | ICD-10-CM | POA: Diagnosis not present

## 2018-08-08 DIAGNOSIS — I10 Essential (primary) hypertension: Secondary | ICD-10-CM

## 2018-08-08 DIAGNOSIS — X32XXXD Exposure to sunlight, subsequent encounter: Secondary | ICD-10-CM | POA: Diagnosis not present

## 2018-08-08 DIAGNOSIS — Z1283 Encounter for screening for malignant neoplasm of skin: Secondary | ICD-10-CM | POA: Diagnosis not present

## 2018-08-08 DIAGNOSIS — Z006 Encounter for examination for normal comparison and control in clinical research program: Secondary | ICD-10-CM

## 2018-08-08 DIAGNOSIS — Z789 Other specified health status: Secondary | ICD-10-CM

## 2018-08-08 DIAGNOSIS — Z85820 Personal history of malignant melanoma of skin: Secondary | ICD-10-CM | POA: Diagnosis not present

## 2018-08-08 DIAGNOSIS — L570 Actinic keratosis: Secondary | ICD-10-CM | POA: Diagnosis not present

## 2018-08-08 DIAGNOSIS — D225 Melanocytic nevi of trunk: Secondary | ICD-10-CM | POA: Diagnosis not present

## 2018-08-08 NOTE — Progress Notes (Signed)
OFFICE NOTE  Chief Complaint:  Routine follow-up  Primary Care Physician: Dione Housekeeper, MD  HPI:  William Jefferson is a pleasant 73 year old male formerly followed by Dr. Rollene Fare. He is here today to establish new cardiac care. His past medical history is significant for coronary artery disease. In 1999 he was having symptoms of angina and presented to her primary care physician who performed an EKG and sent him to the emergency room. There he will have angiography and was found to have a ostial right coronary artery lesion of a very large vessel. He ultimately had placement of an iliac stent, as no coronary stents of that size were available at the time. This is actually stayed very patent for long period of time. In 2009 he had another episode of chest pain and was found to have circumflex and diagonal lesions which were stented with drug-eluting stents. The OM1 was stented with a 2 5 x 10 mm Promus stent that was postdilated to 3.0 mm.  The diagonal was a 2 5 x 18 mm Promus stent that there was also postdilated to 2.75 mm. He's done well since then without any chest pain complaints. 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 He denies any chest pain or shortness of breath. Unfortunately been intolerant to all statins and Zetia. He is unwilling to take any more of those medicines as is found in its cause leg pain, weakness and difficulty performing his job. He is on monotherapy Plavix, but not aspirin, but denies any history of allergy aspirin. His only other issue is a rising PSA. He said a number of prostate biopsies and actually developed an infection at one time. He's had atypical cells but no determinant prostate cancer.  I saw William Jefferson back today. He continues to be without complaints. 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 He does have a history of being intolerant to at least  5 or 6 different statins. He's not as cholesterol checked in about 4 years. We discussed the new medications that are now out to treat cholesterol a said that he be willing to try at least a low-dose statin is to be on a qualifying dose of max tolerated therapy. We would either have to recheck his cholesterol level.  William Jefferson returns today for follow-up. Overall he is doing well and is asymptomatic. He is started taking pravastatin 1-2 times a week which is certainly better than nothing. He's been intolerant of all other statins including Zetia. He's managed to lose a couple pounds but generally hovers around 215. Blood pressure is well-controlled.  08/21/2016  William Jefferson is seen today in follow-up. Since I last saw him he had tried to take pravastatin as he's been intolerant to Crestor 20 mg, Lipitor 40 mg, Zetia 10 mg and Zocor 40 mg doses in the past due to myalgias. He was placed on pravastatin 40 mg with additional symptoms and decreased it to 3 times weekly and eventually stopped the medication. He continues to have elevated cholesterol with LDL-C greater than 150. He has known coronary artery disease with prior PCI in 1999 and again in 2009. We discussed today additional treatment options including PC SK 9 inhibitors and potentially enrolling him in the Orion-10 trial.  08/03/2017  William Jefferson was seen today in follow-up.  Overall he seems to be doing well.  Unfortunately  he has been intolerant to number statins.  He declined the [employer] 10 trial.  We discussed the PCS K9 inhibitors today however is concerned about the injectables.  He asked about what pill options may be available.  I mentioned that he may be a candidate for the CLEAR trial.  He said he may be interested in this.  08/08/2018  William Jefferson is seen today in follow-up.  He is again without complaints.  He continues to work Event organiser.  He has been involved in a research trial with bempedoic acid (clear trial).  Is not clear  whether he is on placebo or treatment.  So far he seems to be tolerating things.  That study may conclude earlier this year as there is likely going to be release of the medication to market.  Otherwise he is without complaints.  No chest pain no shortness of breath.  PMHx:  Past Medical History:  Diagnosis Date  . Bladder stone    removed in 2014 by Dr. Jeffie Jefferson  . Coronary artery disease   . Hyperlipidemia    statin intolerance  . Hypertension     Past Surgical History:  Procedure Laterality Date  . CARDIAC CATHETERIZATION  06/10/1998   large P150 iliac stent to RCA - 3.5x81mm NIR stent (Dr. Marella Chimes)  . CARDIAC CATHETERIZATION  07/16/2007   mid-distal LAD stenosis, normal LV function, 2.5x12mm Promus stent to obtuse marginal, 2.5x32mm Promus stent to osital & proximal diagonal (Dr. Marella Chimes, Dr. Domenic Moras)  . NM MYOCAR PERF WALL MOTION  08/2008   bruce myoview; normal pattern of perfusion, EF 77%  . TRANSTHORACIC ECHOCARDIOGRAM  08/2012   mild conc LVH    FAMHx:  No family history on file.  SOCHx:   reports that he quit smoking about 21 years ago. He has never used smokeless tobacco. No history on file for alcohol and drug.  ALLERGIES:  Allergies  Allergen Reactions  . Crestor [Rosuvastatin]   . Lipitor [Atorvastatin]   . Penicillins   . Sulfa Antibiotics   . Zetia [Ezetimibe]   . Zocor [Simvastatin]     ROS: Pertinent items noted in HPI and remainder of comprehensive ROS otherwise negative.  HOME MEDS: Current Outpatient Medications  Medication Sig Dispense Refill  . AMBULATORY NON FORMULARY MEDICATION Take 180 mg by mouth daily. Medication Name:bempedoic acid vs a placebo, CLEAR Research Study drug provided    . cephALEXin (KEFLEX) 500 MG capsule Take 500 mg by mouth daily.    . clopidogrel (PLAVIX) 75 MG tablet Take 1 tablet (75 mg total) by mouth daily. 90 tablet 3  . lisinopril (PRINIVIL,ZESTRIL) 5 MG tablet Take 1 tablet (5 mg total) by mouth daily. 90  tablet 0  . Lutein 20 MG CAPS Take 1 capsule by mouth daily.    . metoprolol tartrate (LOPRESSOR) 25 MG tablet Take 12.5 mg by mouth 2 (two) times daily.    . Saw Palmetto 450 MG CAPS Take 2 capsules by mouth daily.     . vitamin C (ASCORBIC ACID) 500 MG tablet Take 500 mg by mouth daily.     No current facility-administered medications for this visit.     LABS/IMAGING: No results found for this or any previous visit (from the past 48 hour(s)). No results found.  VITALS: BP 119/77   Pulse 64   Ht 6\' 5"  (1.956 m)   Wt 213 lb (96.6 kg)   BMI 25.26 kg/m   EXAM: General appearance: alert and no distress  Neck: no carotid bruit and no JVD Lungs: clear to auscultation bilaterally Heart: regular rate and rhythm, S1, S2 normal, no murmur, click, rub or gallop Abdomen: soft, non-tender; bowel sounds normal; no masses,  no organomegaly Extremities: extremities normal, atraumatic, no cyanosis or edema Pulses: 2+ and symmetric Skin: Skin color, texture, turgor normal. No rashes or lesions Neurologic: Grossly normal Psych: Mood, affect normal  EKG: Sinus rhythm PVCs at 64-personally reviewed  ASSESSMENT: 1. Coronary artery disease status post PCI to the ostial RCA in 1999, proximal diagonal and circumflex marginal in 2009 2. Dyslipidemia-intolerant to statins and Zetia 3. Hypertension-controlled 4. Elevated PSA - due to prostatitis 5. CLINICAL TRIAL (CLEAR, bempedoic acid)  PLAN: 1.   William Jefferson continues to be involved in the clear trial.  Hopefully he has been on therapy.  Hopefully there will be a crossover later this year if not he may be a candidate to start on bempedoic acid when it becomes commercially available.  We are blinded to his cholesterol.  He has no new chest pain or worsening shortness of breath.  Blood pressure is at goal.  No changes made to his medicines today.  Plan follow-up with me annually or sooner as necessary.  Pixie Casino, MD, [facility], [place] Director of the Advanced Lipid Disorders &  Cardiovascular Risk Reduction Clinic Diplomate of the American Board of Clinical Lipidology Attending Cardiologist  Direct Dial: [phone]  Fax: [phone]  Website:  [email or web address] 08/08/2018, 2:07 PM

## 2018-08-08 NOTE — Patient Instructions (Signed)
Medication Instructions:  Continue current medications  If you need a refill on your cardiac medications before your next appointment, please call your pharmacy.  Labwork: None Ordered   Take the provided lab slips with you to the lab for your blood draw.   When you have your labs (blood work) drawn today and your tests are completely normal, you will receive your results only by MyChart Message (if you have MyChart) -OR-  A paper copy in the mail.  If you have any lab test that is abnormal or we need to change your treatment, we will call you to review these results.  Testing/Procedures: None Ordered  Follow-Up: You will need a follow up appointment in 1 Year.  Please call our office 2 months in advance to schedule this appointment.  You may see Dr Debara Pickett or one of the following Advanced Practice Providers on your designated Care Team: Almyra Deforest, [place] . Fabian Sharp, PA-C    At [facility], you and your health needs are our priority.  As part of our continuing mission to provide you with exceptional heart care, we have created designated Provider Care Teams.  These Care Teams include your primary Cardiologist (physician) and Advanced Practice Providers (APPs -  Physician Assistants and Nurse Practitioners) who all work together to provide you with the care you need, when you need it.  Thank you for choosing [facility] at [facility]!!

## 2018-10-17 ENCOUNTER — Other Ambulatory Visit: Payer: Self-pay

## 2018-10-17 MED ORDER — METOPROLOL TARTRATE 25 MG PO TABS: 12.5000 mg | ORAL_TABLET | Freq: Two times a day (BID) | ORAL | 3 refills | Status: DC

## 2018-11-28 ENCOUNTER — Other Ambulatory Visit: Payer: Self-pay | Admitting: Internal Medicine

## 2018-12-28 ENCOUNTER — Other Ambulatory Visit: Payer: Self-pay | Admitting: Internal Medicine

## 2019-02-20 DIAGNOSIS — Z08 Encounter for follow-up examination after completed treatment for malignant neoplasm: Secondary | ICD-10-CM | POA: Diagnosis not present

## 2019-02-20 DIAGNOSIS — Z1283 Encounter for screening for malignant neoplasm of skin: Secondary | ICD-10-CM | POA: Diagnosis not present

## 2019-02-20 DIAGNOSIS — X32XXXD Exposure to sunlight, subsequent encounter: Secondary | ICD-10-CM | POA: Diagnosis not present

## 2019-02-20 DIAGNOSIS — Z85820 Personal history of malignant melanoma of skin: Secondary | ICD-10-CM | POA: Diagnosis not present

## 2019-02-20 DIAGNOSIS — C44212 Basal cell carcinoma of skin of right ear and external auricular canal: Secondary | ICD-10-CM | POA: Diagnosis not present

## 2019-02-20 DIAGNOSIS — L570 Actinic keratosis: Secondary | ICD-10-CM | POA: Diagnosis not present

## 2019-02-20 DIAGNOSIS — L820 Inflamed seborrheic keratosis: Secondary | ICD-10-CM | POA: Diagnosis not present

## 2019-02-20 DIAGNOSIS — D225 Melanocytic nevi of trunk: Secondary | ICD-10-CM | POA: Diagnosis not present

## 2019-04-03 DIAGNOSIS — C44212 Basal cell carcinoma of skin of right ear and external auricular canal: Secondary | ICD-10-CM | POA: Diagnosis not present

## 2019-04-11 ENCOUNTER — Encounter: Payer: Federal, State, Local not specified - PPO | Admitting: *Deleted

## 2019-04-11 ENCOUNTER — Encounter: Payer: Self-pay | Admitting: *Deleted

## 2019-04-11 ENCOUNTER — Other Ambulatory Visit: Payer: Self-pay

## 2019-04-11 DIAGNOSIS — Z006 Encounter for examination for normal comparison and control in clinical research program: Secondary | ICD-10-CM

## 2019-04-11 NOTE — Research (Signed)
CLEAR Informed Consent   Subject Name: William Jefferson  Subject met inclusion and exclusion criteria.  The informed consent form, study requirements and expectations were reviewed with the subject and questions and concerns were addressed prior to the signing of the consent form.  The subject verbalized understanding of the trial requirements.  The subject agreed to participate in the Clear trial and signed the informed consent  on 04/11/2019.  The informed consent was obtained prior to performance of any protocol-specific procedures for the subject.  A copy of the signed informed consent was given to the subject and a copy was placed in the subject's medical record.   Oletta Cohn.   Subject to research clinic for visit T8M18 in the Clear research study. No cos, aes or saes to report. New drug dispensed, next phone call and clinic visit scheduled.

## 2019-05-15 DIAGNOSIS — Z85828 Personal history of other malignant neoplasm of skin: Secondary | ICD-10-CM | POA: Diagnosis not present

## 2019-05-15 DIAGNOSIS — Z08 Encounter for follow-up examination after completed treatment for malignant neoplasm: Secondary | ICD-10-CM | POA: Diagnosis not present

## 2019-07-03 DIAGNOSIS — N400 Enlarged prostate without lower urinary tract symptoms: Secondary | ICD-10-CM | POA: Diagnosis not present

## 2019-07-03 DIAGNOSIS — R972 Elevated prostate specific antigen [PSA]: Secondary | ICD-10-CM | POA: Diagnosis not present

## 2019-07-07 ENCOUNTER — Other Ambulatory Visit: Payer: Self-pay | Admitting: Internal Medicine

## 2019-07-28 ENCOUNTER — Telehealth: Payer: Self-pay | Admitting: *Deleted

## 2019-07-28 DIAGNOSIS — Z006 Encounter for examination for normal comparison and control in clinical research program: Secondary | ICD-10-CM

## 2019-07-28 NOTE — Telephone Encounter (Signed)
Spoke with patient's wife for visit T9-M21 in the Clear research study.  No aes or saes to report.  Next clinic visit confirmed.

## 2019-08-28 DIAGNOSIS — Z85820 Personal history of malignant melanoma of skin: Secondary | ICD-10-CM | POA: Diagnosis not present

## 2019-08-28 DIAGNOSIS — Z08 Encounter for follow-up examination after completed treatment for malignant neoplasm: Secondary | ICD-10-CM | POA: Diagnosis not present

## 2019-08-28 DIAGNOSIS — L570 Actinic keratosis: Secondary | ICD-10-CM | POA: Diagnosis not present

## 2019-08-28 DIAGNOSIS — L821 Other seborrheic keratosis: Secondary | ICD-10-CM | POA: Diagnosis not present

## 2019-08-28 DIAGNOSIS — X32XXXD Exposure to sunlight, subsequent encounter: Secondary | ICD-10-CM | POA: Diagnosis not present

## 2019-08-28 DIAGNOSIS — Z1283 Encounter for screening for malignant neoplasm of skin: Secondary | ICD-10-CM | POA: Diagnosis not present

## 2019-08-28 DIAGNOSIS — D225 Melanocytic nevi of trunk: Secondary | ICD-10-CM | POA: Diagnosis not present

## 2019-09-11 DIAGNOSIS — D485 Neoplasm of uncertain behavior of skin: Secondary | ICD-10-CM | POA: Diagnosis not present

## 2019-09-11 DIAGNOSIS — L988 Other specified disorders of the skin and subcutaneous tissue: Secondary | ICD-10-CM | POA: Diagnosis not present

## 2019-09-12 ENCOUNTER — Other Ambulatory Visit: Payer: Self-pay

## 2019-09-12 ENCOUNTER — Ambulatory Visit (INDEPENDENT_AMBULATORY_CARE_PROVIDER_SITE_OTHER): Payer: Federal, State, Local not specified - PPO | Admitting: Internal Medicine

## 2019-09-12 ENCOUNTER — Encounter: Payer: Self-pay | Admitting: Internal Medicine

## 2019-09-12 VITALS — BP 130/72 | HR 57 | Temp 97.5°F | Ht 77.0 in | Wt 208.0 lb

## 2019-09-12 DIAGNOSIS — I2510 Atherosclerotic heart disease of native coronary artery without angina pectoris: Secondary | ICD-10-CM

## 2019-09-12 DIAGNOSIS — I10 Essential (primary) hypertension: Secondary | ICD-10-CM | POA: Diagnosis not present

## 2019-09-12 DIAGNOSIS — M791 Myalgia, unspecified site: Secondary | ICD-10-CM

## 2019-09-12 DIAGNOSIS — E782 Mixed hyperlipidemia: Secondary | ICD-10-CM

## 2019-09-12 DIAGNOSIS — T466X5A Adverse effect of antihyperlipidemic and antiarteriosclerotic drugs, initial encounter: Secondary | ICD-10-CM

## 2019-09-12 DIAGNOSIS — Z006 Encounter for examination for normal comparison and control in clinical research program: Secondary | ICD-10-CM | POA: Diagnosis not present

## 2019-09-12 MED ORDER — NITROGLYCERIN 0.4 MG SL SUBL: 0.4000 mg | SUBLINGUAL_TABLET | SUBLINGUAL | 3 refills | Status: DC | PRN

## 2019-09-12 NOTE — Progress Notes (Signed)
OFFICE NOTE  Chief Complaint:  Routine follow-up  Primary Care Physician: Dione Housekeeper, MD  HPI:  William Jefferson is a pleasant 74 year old male formerly followed by Dr. Rollene Fare. He is here today to establish new cardiac care. His past medical history is significant for coronary artery disease. In 1999 he was having symptoms of angina and presented to her primary care physician who performed an EKG and sent him to the emergency room. There he will have angiography and was found to have a ostial right coronary artery lesion of a very large vessel. He ultimately had placement of an iliac stent, as no coronary stents of that size were available at the time. This is actually stayed very patent for long period of time. In 2009 he had another episode of chest pain and was found to have circumflex and diagonal lesions which were stented with drug-eluting stents. The OM1 was stented with a 2 5 x 10 mm Promus stent that was postdilated to 3.0 mm.  The diagonal was a 2 5 x 18 mm Promus stent that there was also postdilated to 2.75 mm. He's done well since then without any chest pain complaints. 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 He denies any chest pain or shortness of breath. Unfortunately been intolerant to all statins and Zetia. He is unwilling to take any more of those medicines as is found in its cause leg pain, weakness and difficulty performing his job. He is on monotherapy Plavix, but not aspirin, but denies any history of allergy aspirin. His only other issue is a rising PSA. He said a number of prostate biopsies and actually developed an infection at one time. He's had atypical cells but no determinant prostate cancer.  I saw William Jefferson back today. He continues to be without complaints. 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 He does have a history of being intolerant to at least  5 or 6 different statins. He's not as cholesterol checked in about 4 years. We discussed the new medications that are now out to treat cholesterol a said that he be willing to try at least a low-dose statin is to be on a qualifying dose of max tolerated therapy. We would either have to recheck his cholesterol level.  William Jefferson returns today for follow-up. Overall he is doing well and is asymptomatic. He is started taking pravastatin 1-2 times a week which is certainly better than nothing. He's been intolerant of all other statins including Zetia. He's managed to lose a couple pounds but generally hovers around 215. Blood pressure is well-controlled.  08/21/2016  William Jefferson is seen today in follow-up. Since I last saw him he had tried to take pravastatin as he's been intolerant to Crestor 20 mg, Lipitor 40 mg, Zetia 10 mg and Zocor 40 mg doses in the past due to myalgias. He was placed on pravastatin 40 mg with additional symptoms and decreased it to 3 times weekly and eventually stopped the medication. He continues to have elevated cholesterol with LDL-C greater than 150. He has known coronary artery disease with prior PCI in 1999 and again in 2009. We discussed today additional treatment options including PC SK 9 inhibitors and potentially enrolling him in the Orion-10 trial.  08/03/2017  William Jefferson was seen today in follow-up.  Overall he seems to be doing well.  Unfortunately  he has been intolerant to number statins.  He declined the [employer] 10 trial.  We discussed the PCS K9 inhibitors today however is concerned about the injectables.  He asked about what pill options may be available.  I mentioned that he may be a candidate for the CLEAR trial.  He said he may be interested in this.  08/08/2018  William Jefferson is seen today in follow-up.  He is again without complaints.  He continues to work Event organiser.  He has been involved in a research trial with bempedoic acid (clear trial).  Is not clear  whether he is on placebo or treatment.  So far he seems to be tolerating things.  That study may conclude earlier this year as there is likely going to be release of the medication to market.  Otherwise he is without complaints.  No chest pain no shortness of breath.  09/12/2019  William Jefferson is seen today for follow-up.  He continues to do well.  Nuys any chest pain or worsening shortness of breath.  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  He still enrolled in the clear trial.  I will reach out to the study coordinators to see whether or not he has crossed over to treatment or is enrolled in the clear outcomes trial.  He was made aware that the trial compound bempedoic acid is commercially available as Nexletol.  PMHx:  Past Medical History:  Diagnosis Date  . Bladder stone    removed in 2014 by Dr. Jeffie Pollock  . Coronary artery disease   . Hyperlipidemia    statin intolerance  . Hypertension     Past Surgical History:  Procedure Laterality Date  . CARDIAC CATHETERIZATION  06/10/1998   large P150 iliac stent to RCA - 3.5x48mm NIR stent (Dr. Marella Chimes)  . CARDIAC CATHETERIZATION  07/16/2007   mid-distal LAD stenosis, normal LV function, 2.5x19mm Promus stent to obtuse marginal, 2.5x63mm Promus stent to osital & proximal diagonal (Dr. Marella Chimes, Dr. Domenic Moras)  . NM MYOCAR PERF WALL MOTION  08/2008   bruce myoview; normal pattern of perfusion, EF 77%  . TRANSTHORACIC ECHOCARDIOGRAM  08/2012   mild conc LVH    FAMHx:  No family history on file.  SOCHx:   reports that he quit smoking about 22 years ago. He has never used smokeless tobacco. No history on file for alcohol and drug.  ALLERGIES:  Allergies  Allergen Reactions  . Crestor [Rosuvastatin]   . Lipitor [Atorvastatin]   . Penicillins   . Sulfa Antibiotics   . Zetia [Ezetimibe]   . Zocor [Simvastatin]     ROS: Pertinent items noted in HPI and remainder of comprehensive ROS otherwise  negative.  HOME MEDS: Current Outpatient Medications  Medication Sig Dispense Refill  . AMBULATORY NON FORMULARY MEDICATION Take 180 mg by mouth daily. Medication Name:bempedoic acid vs a placebo, CLEAR Research Study drug provided    . cephALEXin (KEFLEX) 500 MG capsule Take by mouth.    . clopidogrel (PLAVIX) 75 MG tablet TAKE 1 TABLET DAILY 90 tablet 0  . lisinopril (ZESTRIL) 5 MG tablet Take 1 tablet (5 mg total) by mouth daily. 90 tablet 2  . Lutein 20 MG CAPS Take 1 capsule by mouth daily.    . metoprolol tartrate (LOPRESSOR) 25 MG tablet Take 0.5 tablets (12.5 mg total) by mouth 2 (two) times daily. 90 tablet 3  . Saw Palmetto 450 MG CAPS Take 2 capsules by  mouth daily.     . vitamin C (ASCORBIC ACID) 500 MG tablet Take 500 mg by mouth daily.     No current facility-administered medications for this visit.    LABS/IMAGING: No results found for this or any previous visit (from the past 48 hour(s)). No results found.  VITALS: BP 130/72   Pulse (!) 57   Temp (!) 97.5 F (36.4 C)   Ht 6\' 5"  (1.956 m)   Wt 208 lb (94.3 kg)   SpO2 97%   BMI 24.67 kg/m   EXAM: General appearance: alert and no distress Neck: no carotid bruit and no JVD Lungs: clear to auscultation bilaterally Heart: regular rate and rhythm, S1, S2 normal, no murmur, click, rub or gallop Abdomen: soft, non-tender; bowel sounds normal; no masses,  no organomegaly Extremities: extremities normal, atraumatic, no cyanosis or edema Pulses: 2+ and symmetric Skin: Skin color, texture, turgor normal. No rashes or lesions Neurologic: Grossly normal Psych: Mood, affect normal  EKG: Sinus bradycardia at 57-personally reviewed  ASSESSMENT: 1. Coronary artery disease status post PCI to the ostial RCA in 1999, proximal diagonal and circumflex marginal in 2009 2. Dyslipidemia-intolerant to statins and Zetia 3. Hypertension-controlled 4. Elevated PSA - due to prostatitis 5. CLINICAL TRIAL (CLEAR, bempedoic  acid)  PLAN: 1.   William Jefferson remains asymptomatic and stays physically active.  Nuys any chest pain or worsening shortness of breath.  Blood pressures well controlled.  His cholesterol is blinded to me as involvement in the clear trial.  I will reach out to [facility] to see what his trajectory is with this trial and as to whether he is crossed over to treatment or not.  Follow-up in 1 year or sooner as necessary.  Pixie Casino, MD, [facility], [place] Director of the Advanced Lipid Disorders &  Cardiovascular Risk Reduction Clinic Diplomate of the American Board of Clinical Lipidology Attending Cardiologist  Direct Dial: [phone]  Fax: [phone]  Website:  [email or web address] 09/12/2019, 1:34 PM

## 2019-09-12 NOTE — Patient Instructions (Signed)

## 2019-10-17 ENCOUNTER — Other Ambulatory Visit: Payer: Self-pay

## 2019-10-17 ENCOUNTER — Encounter: Payer: Federal, State, Local not specified - PPO | Admitting: *Deleted

## 2019-10-17 VITALS — BP 156/78 | HR 61 | Wt 211.6 lb

## 2019-10-17 DIAGNOSIS — Z006 Encounter for examination for normal comparison and control in clinical research program: Secondary | ICD-10-CM

## 2019-10-17 NOTE — Research (Signed)
William Jefferson was seen in lab for CLEAR visit T10, M24. No AE's or SAE's to report. 100% compliance. New medications were dispensed.

## 2019-10-28 ENCOUNTER — Other Ambulatory Visit: Payer: Self-pay | Admitting: Internal Medicine

## 2019-12-04 ENCOUNTER — Other Ambulatory Visit: Payer: Self-pay | Admitting: Internal Medicine

## 2019-12-23 ENCOUNTER — Telehealth: Payer: Self-pay | Admitting: *Deleted

## 2019-12-23 NOTE — Telephone Encounter (Signed)
Completed phone call/chart check for CLEAR visit T11-M27. Medications updated and no AE's/SAE's to report. Confirmed next in clinic appointment.

## 2020-02-26 DIAGNOSIS — Z85820 Personal history of malignant melanoma of skin: Secondary | ICD-10-CM | POA: Diagnosis not present

## 2020-02-26 DIAGNOSIS — Z08 Encounter for follow-up examination after completed treatment for malignant neoplasm: Secondary | ICD-10-CM | POA: Diagnosis not present

## 2020-02-26 DIAGNOSIS — D225 Melanocytic nevi of trunk: Secondary | ICD-10-CM | POA: Diagnosis not present

## 2020-02-26 DIAGNOSIS — Z1283 Encounter for screening for malignant neoplasm of skin: Secondary | ICD-10-CM | POA: Diagnosis not present

## 2020-04-15 ENCOUNTER — Other Ambulatory Visit: Payer: Self-pay

## 2020-04-15 ENCOUNTER — Encounter: Payer: Federal, State, Local not specified - PPO | Admitting: *Deleted

## 2020-04-15 VITALS — BP 137/73 | HR 63 | Wt 208.0 lb

## 2020-04-15 DIAGNOSIS — Z006 Encounter for examination for normal comparison and control in clinical research program: Secondary | ICD-10-CM

## 2020-04-15 NOTE — Research (Signed)
Subject came to clinic for T12, M30 Visit in the CLEAR research study.  Subject was re-consented to [place] Version 8.1 [id number] Local [id number] at [place] on October 14th, 2021.    Subject Name: William Jefferson  Subject met inclusion and exclusion criteria.  The informed consent form, study requirements and expectations were reviewed with the subject and questions and concerns were addressed prior to the signing of the consent form.  The subject verbalized understanding of the trial requirements.  The subject agreed to participate in the CLEAR trial and signed the informed consent at [place] on 04/15/20  The informed consent was obtained prior to performance of any protocol-specific procedures for the subject.  A copy of the signed informed consent was given to the subject and a copy was placed in the subject's medical record.   Preston Fleeting C    Subject's concomitant medications have been reviewed and updated as needed. There are no AE's or SAE's to report to sponsor at this time. Subject was 99% compliant with IP. New IP was dispensed and next appointment was scheduled for Thursday, April 7th, 2022 at 0830.

## 2020-04-16 DIAGNOSIS — M25551 Pain in right hip: Secondary | ICD-10-CM | POA: Diagnosis not present

## 2020-04-16 DIAGNOSIS — M25561 Pain in right knee: Secondary | ICD-10-CM | POA: Diagnosis not present

## 2020-05-03 DIAGNOSIS — M545 Low back pain, unspecified: Secondary | ICD-10-CM | POA: Diagnosis not present

## 2020-05-03 DIAGNOSIS — M25551 Pain in right hip: Secondary | ICD-10-CM | POA: Diagnosis not present

## 2020-05-05 DIAGNOSIS — M9902 Segmental and somatic dysfunction of thoracic region: Secondary | ICD-10-CM | POA: Diagnosis not present

## 2020-05-05 DIAGNOSIS — M9903 Segmental and somatic dysfunction of lumbar region: Secondary | ICD-10-CM | POA: Diagnosis not present

## 2020-05-05 DIAGNOSIS — M5137 Other intervertebral disc degeneration, lumbosacral region: Secondary | ICD-10-CM | POA: Diagnosis not present

## 2020-05-05 DIAGNOSIS — M9901 Segmental and somatic dysfunction of cervical region: Secondary | ICD-10-CM | POA: Diagnosis not present

## 2020-05-06 DIAGNOSIS — M9902 Segmental and somatic dysfunction of thoracic region: Secondary | ICD-10-CM | POA: Diagnosis not present

## 2020-05-06 DIAGNOSIS — M9903 Segmental and somatic dysfunction of lumbar region: Secondary | ICD-10-CM | POA: Diagnosis not present

## 2020-05-06 DIAGNOSIS — M9901 Segmental and somatic dysfunction of cervical region: Secondary | ICD-10-CM | POA: Diagnosis not present

## 2020-05-06 DIAGNOSIS — M5137 Other intervertebral disc degeneration, lumbosacral region: Secondary | ICD-10-CM | POA: Diagnosis not present

## 2020-05-10 DIAGNOSIS — M9901 Segmental and somatic dysfunction of cervical region: Secondary | ICD-10-CM | POA: Diagnosis not present

## 2020-05-10 DIAGNOSIS — M5137 Other intervertebral disc degeneration, lumbosacral region: Secondary | ICD-10-CM | POA: Diagnosis not present

## 2020-05-10 DIAGNOSIS — M9903 Segmental and somatic dysfunction of lumbar region: Secondary | ICD-10-CM | POA: Diagnosis not present

## 2020-05-10 DIAGNOSIS — M9902 Segmental and somatic dysfunction of thoracic region: Secondary | ICD-10-CM | POA: Diagnosis not present

## 2020-05-12 DIAGNOSIS — M9901 Segmental and somatic dysfunction of cervical region: Secondary | ICD-10-CM | POA: Diagnosis not present

## 2020-05-12 DIAGNOSIS — M9902 Segmental and somatic dysfunction of thoracic region: Secondary | ICD-10-CM | POA: Diagnosis not present

## 2020-05-12 DIAGNOSIS — M5137 Other intervertebral disc degeneration, lumbosacral region: Secondary | ICD-10-CM | POA: Diagnosis not present

## 2020-05-12 DIAGNOSIS — M9903 Segmental and somatic dysfunction of lumbar region: Secondary | ICD-10-CM | POA: Diagnosis not present

## 2020-05-13 DIAGNOSIS — M5137 Other intervertebral disc degeneration, lumbosacral region: Secondary | ICD-10-CM | POA: Diagnosis not present

## 2020-05-13 DIAGNOSIS — M9902 Segmental and somatic dysfunction of thoracic region: Secondary | ICD-10-CM | POA: Diagnosis not present

## 2020-05-13 DIAGNOSIS — M9901 Segmental and somatic dysfunction of cervical region: Secondary | ICD-10-CM | POA: Diagnosis not present

## 2020-05-13 DIAGNOSIS — M9903 Segmental and somatic dysfunction of lumbar region: Secondary | ICD-10-CM | POA: Diagnosis not present

## 2020-05-17 DIAGNOSIS — M9901 Segmental and somatic dysfunction of cervical region: Secondary | ICD-10-CM | POA: Diagnosis not present

## 2020-05-17 DIAGNOSIS — M9903 Segmental and somatic dysfunction of lumbar region: Secondary | ICD-10-CM | POA: Diagnosis not present

## 2020-05-17 DIAGNOSIS — M9902 Segmental and somatic dysfunction of thoracic region: Secondary | ICD-10-CM | POA: Diagnosis not present

## 2020-05-17 DIAGNOSIS — M5137 Other intervertebral disc degeneration, lumbosacral region: Secondary | ICD-10-CM | POA: Diagnosis not present

## 2020-05-19 DIAGNOSIS — M9903 Segmental and somatic dysfunction of lumbar region: Secondary | ICD-10-CM | POA: Diagnosis not present

## 2020-05-19 DIAGNOSIS — M5137 Other intervertebral disc degeneration, lumbosacral region: Secondary | ICD-10-CM | POA: Diagnosis not present

## 2020-05-19 DIAGNOSIS — M9901 Segmental and somatic dysfunction of cervical region: Secondary | ICD-10-CM | POA: Diagnosis not present

## 2020-05-19 DIAGNOSIS — M9902 Segmental and somatic dysfunction of thoracic region: Secondary | ICD-10-CM | POA: Diagnosis not present

## 2020-05-20 DIAGNOSIS — M5416 Radiculopathy, lumbar region: Secondary | ICD-10-CM | POA: Diagnosis not present

## 2020-05-20 DIAGNOSIS — M5137 Other intervertebral disc degeneration, lumbosacral region: Secondary | ICD-10-CM | POA: Diagnosis not present

## 2020-05-20 DIAGNOSIS — M9902 Segmental and somatic dysfunction of thoracic region: Secondary | ICD-10-CM | POA: Diagnosis not present

## 2020-05-20 DIAGNOSIS — M9903 Segmental and somatic dysfunction of lumbar region: Secondary | ICD-10-CM | POA: Diagnosis not present

## 2020-05-20 DIAGNOSIS — M9901 Segmental and somatic dysfunction of cervical region: Secondary | ICD-10-CM | POA: Diagnosis not present

## 2020-05-21 ENCOUNTER — Ambulatory Visit: Payer: Self-pay | Admitting: Nurse Practitioner

## 2020-05-21 DIAGNOSIS — R972 Elevated prostate specific antigen [PSA]: Secondary | ICD-10-CM | POA: Diagnosis not present

## 2020-05-24 DIAGNOSIS — M9902 Segmental and somatic dysfunction of thoracic region: Secondary | ICD-10-CM | POA: Diagnosis not present

## 2020-05-24 DIAGNOSIS — M9901 Segmental and somatic dysfunction of cervical region: Secondary | ICD-10-CM | POA: Diagnosis not present

## 2020-05-24 DIAGNOSIS — M5137 Other intervertebral disc degeneration, lumbosacral region: Secondary | ICD-10-CM | POA: Diagnosis not present

## 2020-05-24 DIAGNOSIS — M9903 Segmental and somatic dysfunction of lumbar region: Secondary | ICD-10-CM | POA: Diagnosis not present

## 2020-05-25 DIAGNOSIS — M9902 Segmental and somatic dysfunction of thoracic region: Secondary | ICD-10-CM | POA: Diagnosis not present

## 2020-05-25 DIAGNOSIS — M9901 Segmental and somatic dysfunction of cervical region: Secondary | ICD-10-CM | POA: Diagnosis not present

## 2020-05-25 DIAGNOSIS — M5137 Other intervertebral disc degeneration, lumbosacral region: Secondary | ICD-10-CM | POA: Diagnosis not present

## 2020-05-25 DIAGNOSIS — M9903 Segmental and somatic dysfunction of lumbar region: Secondary | ICD-10-CM | POA: Diagnosis not present

## 2020-05-26 DIAGNOSIS — M9901 Segmental and somatic dysfunction of cervical region: Secondary | ICD-10-CM | POA: Diagnosis not present

## 2020-05-26 DIAGNOSIS — M9902 Segmental and somatic dysfunction of thoracic region: Secondary | ICD-10-CM | POA: Diagnosis not present

## 2020-05-26 DIAGNOSIS — M5137 Other intervertebral disc degeneration, lumbosacral region: Secondary | ICD-10-CM | POA: Diagnosis not present

## 2020-05-26 DIAGNOSIS — M9903 Segmental and somatic dysfunction of lumbar region: Secondary | ICD-10-CM | POA: Diagnosis not present

## 2020-05-31 DIAGNOSIS — M9902 Segmental and somatic dysfunction of thoracic region: Secondary | ICD-10-CM | POA: Diagnosis not present

## 2020-05-31 DIAGNOSIS — M9901 Segmental and somatic dysfunction of cervical region: Secondary | ICD-10-CM | POA: Diagnosis not present

## 2020-05-31 DIAGNOSIS — M5137 Other intervertebral disc degeneration, lumbosacral region: Secondary | ICD-10-CM | POA: Diagnosis not present

## 2020-05-31 DIAGNOSIS — M9903 Segmental and somatic dysfunction of lumbar region: Secondary | ICD-10-CM | POA: Diagnosis not present

## 2020-06-01 DIAGNOSIS — M5137 Other intervertebral disc degeneration, lumbosacral region: Secondary | ICD-10-CM | POA: Diagnosis not present

## 2020-06-01 DIAGNOSIS — M9903 Segmental and somatic dysfunction of lumbar region: Secondary | ICD-10-CM | POA: Diagnosis not present

## 2020-06-01 DIAGNOSIS — M9902 Segmental and somatic dysfunction of thoracic region: Secondary | ICD-10-CM | POA: Diagnosis not present

## 2020-06-01 DIAGNOSIS — M9901 Segmental and somatic dysfunction of cervical region: Secondary | ICD-10-CM | POA: Diagnosis not present

## 2020-06-03 ENCOUNTER — Other Ambulatory Visit: Payer: Self-pay

## 2020-06-03 ENCOUNTER — Ambulatory Visit (INDEPENDENT_AMBULATORY_CARE_PROVIDER_SITE_OTHER): Payer: Federal, State, Local not specified - PPO | Admitting: Nurse Practitioner

## 2020-06-03 ENCOUNTER — Encounter: Payer: Self-pay | Admitting: Nurse Practitioner

## 2020-06-03 VITALS — BP 148/79 | HR 76 | Temp 99.0°F | Ht 77.0 in | Wt 209.0 lb

## 2020-06-03 DIAGNOSIS — M9902 Segmental and somatic dysfunction of thoracic region: Secondary | ICD-10-CM | POA: Diagnosis not present

## 2020-06-03 DIAGNOSIS — I10 Essential (primary) hypertension: Secondary | ICD-10-CM | POA: Diagnosis not present

## 2020-06-03 DIAGNOSIS — M9903 Segmental and somatic dysfunction of lumbar region: Secondary | ICD-10-CM | POA: Diagnosis not present

## 2020-06-03 DIAGNOSIS — Z0001 Encounter for general adult medical examination with abnormal findings: Secondary | ICD-10-CM | POA: Diagnosis not present

## 2020-06-03 DIAGNOSIS — Z0000 Encounter for general adult medical examination without abnormal findings: Secondary | ICD-10-CM | POA: Diagnosis not present

## 2020-06-03 DIAGNOSIS — M9901 Segmental and somatic dysfunction of cervical region: Secondary | ICD-10-CM | POA: Diagnosis not present

## 2020-06-03 DIAGNOSIS — M25551 Pain in right hip: Secondary | ICD-10-CM | POA: Diagnosis not present

## 2020-06-03 DIAGNOSIS — M5137 Other intervertebral disc degeneration, lumbosacral region: Secondary | ICD-10-CM | POA: Diagnosis not present

## 2020-06-03 MED ORDER — LISINOPRIL 10 MG PO TABS: 10.0000 mg | ORAL_TABLET | Freq: Every day | ORAL | Status: DC

## 2020-06-03 MED ORDER — DICLOFENAC SODIUM 1 % EX GEL: 2.0000 g | Freq: Four times a day (QID) | CUTANEOUS | 2 refills | Status: DC

## 2020-06-03 NOTE — Assessment & Plan Note (Signed)
Patient is a 74 year old male who presents to clinic for an annual physical exam.  Completed head to toe assessment.  Patient reports right knee pain and right hip pain radiating down right upper thigh.  Patient was referred to [facility] a few years ago for the same symptoms.  Patient decided to go with chiropractic but now has unresolved pain in right hip.  Patient will prefer to make his own appointment back to [facility] for reassessment of right hip pain and knee pain.  Patient is using nothing for symptoms alleviation at this time.  Advised patient to apply ice on joints, Voltaren gel and Tylenol as needed. Labs completed-CBC, CMP, lipid panel,  Education provided on preventative and health maintenance printed handouts given to patient. Patient refuses colonoscopy. Rx refill sent to pharmacy, follow-up in 1 year for routine physical and follow-up with worsening or unresolved symptoms for right hip and knee pain.

## 2020-06-03 NOTE — Progress Notes (Deleted)
New Patient Office Visit  Subjective:  Patient ID: William Jefferson, male    DOB: [birth year]  Age: 74 y.o. MRN: [id number]  CC:  Chief Complaint  Patient presents with  . New Patient (Initial Visit)    HPI William Jefferson presents for .   Encounter for general adult medical examination Physical: Patient's last physical exam was 1 year ago .  Weight: Appropriate for height (BMI less than 27%) ;  Blood Pressure: Normal (BP less than 120/80) ;  Medical History: Patient history reviewed ; Family history reviewed ;  Allergies Reviewed: No change in current allergies ;  Medications Reviewed: Medications reviewed - no changes ;  Lipids: Normal lipid levels ;  Smoking: Life-long non-smoker ; Quit 2009 Physical Activity: Exercises at least 3 times per week ; No Alcohol/Drug Use: Is a non-drinker ; No illicit drug use ;  Patient is not afflicted from Stress Incontinence and Urge Incontinence  Safety: reviewed ; Patient wears a seat belt, has smoke detectors, has carbon monoxide detectors, and wears sunscreen with extended sun exposure. Dental Care: biannual cleanings, brushes and flosses daily. Ophthalmology/Optometry: Annual visit.  Hearing loss: none Vision impairments: none  Past Medical History:  Diagnosis Date  . Bladder stone    removed in 2014 by Dr. Jeffie Pollock  . Cataract   . Coronary artery disease   . Hyperlipidemia    statin intolerance  . Hypertension     Past Surgical History:  Procedure Laterality Date  . CARDIAC CATHETERIZATION  06/10/1998   large P150 iliac stent to RCA - 3.5x87mm NIR stent (Dr. Marella Chimes)  . CARDIAC CATHETERIZATION  07/16/2007   mid-distal LAD stenosis, normal LV function, 2.5x61mm Promus stent to obtuse marginal, 2.5x74mm Promus stent to osital & proximal diagonal (Dr. Marella Chimes, Dr. Domenic Moras)  . MELANOMA EXCISION  2018  . NM MYOCAR PERF WALL MOTION  08/2008   bruce myoview; normal pattern of perfusion, EF 77%  . PROSTATE BIOPSY  2002    . TRANSTHORACIC ECHOCARDIOGRAM  08/2012   mild conc LVH    Family History  Problem Relation Age of Onset  . Cancer Mother        breast  . Emphysema Father   . Cirrhosis Brother   . Alcohol abuse Brother     Social History   Socioeconomic History  . Marital status: Married    Spouse name: Not on file  . Number of children: 3  . Years of education: 16  . Highest education level: Bachelor's degree (e.g., BA, AB, BS)  Occupational History    Employer: [employer]  Tobacco Use  . Smoking status: Former Smoker    Packs/day: 3.00    Years: 30.00    Pack years: 90.00    Quit date: 08/03/1997    Years since quitting: 22.8  . Smokeless tobacco: Never Used  Vaping Use  . Vaping Use: Never used  Substance and Sexual Activity  . Alcohol use: Not Currently  . Drug use: Not Currently  . Sexual activity: Not Currently  Other Topics Concern  . Not on file  Social History Narrative  . Not on file   Social Determinants of Health   Financial Resource Strain:   . Difficulty of Paying Living Expenses: Not on file  Food Insecurity:   . Worried About Charity fundraiser in the Last Year: Not on file  . Ran Out of Food in the Last Year: Not on file  Transportation Needs:   .  Lack of Transportation (Medical): Not on file  . Lack of Transportation (Non-Medical): Not on file  Physical Activity:   . Days of Exercise per Week: Not on file  . Minutes of Exercise per Session: Not on file  Stress:   . Feeling of Stress : Not on file  Social Connections:   . Frequency of Communication with Friends and Family: Not on file  . Frequency of Social Gatherings with Friends and Family: Not on file  . Attends Religious Services: Not on file  . Active Member of Clubs or Organizations: Not on file  . Attends Archivist Meetings: Not on file  . Marital Status: Not on file  Intimate Partner Violence:   . Fear of Current or Ex-Partner: Not on file  . Emotionally Abused: Not on file   . Physically Abused: Not on file  . Sexually Abused: Not on file    ROS Review of Systems  Objective:   Today's Vitals: BP (!) 148/79   Pulse 76   Temp 99 F (37.2 C) (Temporal)   Ht 6\' 5"  (1.956 m)   Wt 209 lb (94.8 kg)   BMI 24.78 kg/m   Physical Exam  Assessment & Plan:   Problem List Items Addressed This Visit    None      Outpatient Encounter Medications as of 06/03/2020  Medication Sig  . AMBULATORY NON FORMULARY MEDICATION Take 180 mg by mouth daily. Medication Name:bempedoic acid vs a placebo, CLEAR Research Study drug provided  . cephALEXin (KEFLEX) 500 MG capsule Take by mouth.  . clopidogrel (PLAVIX) 75 MG tablet TAKE 1 TABLET DAILY  . lisinopril (ZESTRIL) 5 MG tablet TAKE 1 TABLET EVERY DAY  . Lutein 20 MG CAPS Take 1 capsule by mouth daily.  . metoprolol tartrate (LOPRESSOR) 25 MG tablet TAKE (1/2) TABLET TWICE DAILY.  . nitroGLYCERIN (NITROSTAT) 0.4 MG SL tablet Place 1 tablet (0.4 mg total) under the tongue every 5 (five) minutes as needed for chest pain.  . Saw Palmetto 450 MG CAPS Take 2 capsules by mouth daily.   . vitamin C (ASCORBIC ACID) 500 MG tablet Take 500 mg by mouth daily.   No facility-administered encounter medications on file as of 06/03/2020.    Follow-up: No follow-ups on file.   [name], NP

## 2020-06-03 NOTE — Assessment & Plan Note (Signed)
Essential hypertension not well controlled on lisinopril 5 mg tablet by mouth daily and metoprolol 25 mg tablet half tablet twice daily.  Increased lisinopril from 5 mg to 10 mg tablet daily.  Advised patient to keep 1 week blood pressure log and call results to the office, follow-up in 2 weeks to reassess blood pressure. Rx sent to pharmacy. Education provided with printed handouts given. Continue healthy diet and exercise regimen as tolerated

## 2020-06-03 NOTE — Patient Instructions (Signed)
Health Maintenance After Age 74 After age 74, you are at a higher risk for certain long-term diseases and infections as well as injuries from falls. Falls are a major cause of broken bones and head injuries in people who are older than age 74. Getting regular preventive care can help to keep you healthy and well. Preventive care includes getting regular testing and making lifestyle changes as recommended by your health care provider. Talk with your health care provider about:  Which screenings and tests you should have. A screening is a test that checks for a disease when you have no symptoms.  A diet and exercise plan that is right for you. What should I know about screenings and tests to prevent falls? Screening and testing are the best ways to find a health problem early. Early diagnosis and treatment give you the best chance of managing medical conditions that are common after age 74. Certain conditions and lifestyle choices may make you more likely to have a fall. Your health care provider may recommend:  Regular vision checks. Poor vision and conditions such as cataracts can make you more likely to have a fall. If you wear glasses, make sure to get your prescription updated if your vision changes.  Medicine review. Work with your health care provider to regularly review all of the medicines you are taking, including over-the-counter medicines. Ask your health care provider about any side effects that may make you more likely to have a fall. Tell your health care provider if any medicines that you take make you feel dizzy or sleepy.  Osteoporosis screening. Osteoporosis is a condition that causes the bones to get weaker. This can make the bones weak and cause them to break more easily.  Blood pressure screening. Blood pressure changes and medicines to control blood pressure can make you feel dizzy.  Strength and balance checks. Your health care provider may recommend certain tests to check your  strength and balance while standing, walking, or changing positions.  Foot health exam. Foot pain and numbness, as well as not wearing proper footwear, can make you more likely to have a fall.  Depression screening. You may be more likely to have a fall if you have a fear of falling, feel emotionally low, or feel unable to do activities that you used to do.  Alcohol use screening. Using too much alcohol can affect your balance and may make you more likely to have a fall. What actions can I take to lower my risk of falls? General instructions  Talk with your health care provider about your risks for falling. Tell your health care provider if: ? You fall. Be sure to tell your health care provider about all falls, even ones that seem minor. ? You feel dizzy, sleepy, or off-balance.  Take over-the-counter and prescription medicines only as told by your health care provider. These include any supplements.  Eat a healthy diet and maintain a healthy weight. A healthy diet includes low-fat dairy products, low-fat (lean) meats, and fiber from whole grains, beans, and lots of fruits and vegetables. Home safety  Remove any tripping hazards, such as rugs, cords, and clutter.  Install safety equipment such as grab bars in bathrooms and safety rails on stairs.  Keep rooms and walkways well-lit. Activity   Follow a regular exercise program to stay fit. This will help you maintain your balance. Ask your health care provider what types of exercise are appropriate for you.  If you need a cane or   walker, use it as recommended by your health care provider.  Wear supportive shoes that have nonskid soles. Lifestyle  Do not drink alcohol if your health care provider tells you not to drink.  If you drink alcohol, limit how much you have: ? 0-1 drink a day for women. ? 0-2 drinks a day for men.  Be aware of how much alcohol is in your drink. In the [place] one drink equals one typical bottle of beer (12  oz), one-half glass of wine (5 oz), or one shot of hard liquor (1 oz).  Do not use any products that contain nicotine or tobacco, such as cigarettes and e-cigarettes. If you need help quitting, ask your health care provider. Summary  Having a healthy lifestyle and getting preventive care can help to protect your health and wellness after age 50.  Screening and testing are the best way to find a health problem early and help you avoid having a fall. Early diagnosis and treatment give you the best chance for managing medical conditions that are more common for people who are older than age 41.  Falls are a major cause of broken bones and head injuries in people who are older than age 63. Take precautions to prevent a fall at home.  Work with your health care provider to learn what changes you can make to improve your health and wellness and to prevent falls. This information is not intended to replace advice given to you by your health care provider. Make sure you discuss any questions you have with your health care provider. Document Revised: 10/10/2018 Document Reviewed: 05/02/2017 Elsevier Patient Education  [place]. Hypertension, Adult High blood pressure (hypertension) is when the force of blood pumping through the arteries is too strong. The arteries are the blood vessels that carry blood from the heart throughout the body. Hypertension forces the heart to work harder to pump blood and may cause arteries to become narrow or stiff. Untreated or uncontrolled hypertension can cause a heart attack, heart failure, a stroke, kidney disease, and other problems. A blood pressure reading consists of a higher number over a lower number. Ideally, your blood pressure should be below 120/80. The first ("top") number is called the systolic pressure. It is a measure of the pressure in your arteries as your heart beats. The second ("bottom") number is called the diastolic pressure. It is a measure  of the pressure in your arteries as the heart relaxes. What are the causes? The exact cause of this condition is not known. There are some conditions that result in or are related to high blood pressure. What increases the risk? Some risk factors for high blood pressure are under your control. The following factors may make you more likely to develop this condition:  Smoking.  Having type 2 diabetes mellitus, high cholesterol, or both.  Not getting enough exercise or physical activity.  Being overweight.  Having too much fat, sugar, calories, or salt (sodium) in your diet.  Drinking too much alcohol. Some risk factors for high blood pressure may be difficult or impossible to change. Some of these factors include:  Having chronic kidney disease.  Having a family history of high blood pressure.  Age. Risk increases with age.  Race. You may be at higher risk if you are African American.  Gender. Men are at higher risk than women before age 87. After age 86, women are at higher risk than men.  Having obstructive sleep apnea.  Stress. What  are the signs or symptoms? High blood pressure may not cause symptoms. Very high blood pressure (hypertensive crisis) may cause:  Headache.  Anxiety.  Shortness of breath.  Nosebleed.  Nausea and vomiting.  Vision changes.  Severe chest pain.  Seizures. How is this diagnosed? This condition is diagnosed by measuring your blood pressure while you are seated, with your arm resting on a flat surface, your legs uncrossed, and your feet flat on the floor. The cuff of the blood pressure monitor will be placed directly against the skin of your upper arm at the level of your heart. It should be measured at least twice using the same arm. Certain conditions can cause a difference in blood pressure between your right and left arms. Certain factors can cause blood pressure readings to be lower or higher than normal for a short period of  time:  When your blood pressure is higher when you are in a health care provider's office than when you are at home, this is called white coat hypertension. Most people with this condition do not need medicines.  When your blood pressure is higher at home than when you are in a health care provider's office, this is called masked hypertension. Most people with this condition may need medicines to control blood pressure. If you have a high blood pressure reading during one visit or you have normal blood pressure with other risk factors, you may be asked to:  Return on a different day to have your blood pressure checked again.  Monitor your blood pressure at home for 1 week or longer. If you are diagnosed with hypertension, you may have other blood or imaging tests to help your health care provider understand your overall risk for other conditions. How is this treated? This condition is treated by making healthy lifestyle changes, such as eating healthy foods, exercising more, and reducing your alcohol intake. Your health care provider may prescribe medicine if lifestyle changes are not enough to get your blood pressure under control, and if:  Your systolic blood pressure is above 130.  Your diastolic blood pressure is above 80. Your personal target blood pressure may vary depending on your medical conditions, your age, and other factors. Follow these instructions at home: Eating and drinking   Eat a diet that is high in fiber and potassium, and low in sodium, added sugar, and fat. An example eating plan is called the DASH (Dietary Approaches to Stop Hypertension) diet. To eat this way: ? Eat plenty of fresh fruits and vegetables. Try to fill one half of your plate at each meal with fruits and vegetables. ? Eat whole grains, such as whole-wheat pasta, brown rice, or whole-grain bread. Fill about one fourth of your plate with whole grains. ? Eat or drink low-fat dairy products, such as skim  milk or low-fat yogurt. ? Avoid fatty cuts of meat, processed or cured meats, and poultry with skin. Fill about one fourth of your plate with lean proteins, such as fish, chicken without skin, beans, eggs, or tofu. ? Avoid pre-made and processed foods. These tend to be higher in sodium, added sugar, and fat.  Reduce your daily sodium intake. Most people with hypertension should eat less than 1,500 mg of sodium a day.  Do not drink alcohol if: ? Your health care provider tells you not to drink. ? You are pregnant, may be pregnant, or are planning to become pregnant.  If you drink alcohol: ? Limit how much you use to:  0-1 drink a day for women.  0-2 drinks a day for men. ? Be aware of how much alcohol is in your drink. In the [place] one drink equals one 12 oz bottle of beer (355 mL), one 5 oz glass of wine (148 mL), or one 1 oz glass of hard liquor (44 mL). Lifestyle   Work with your health care provider to maintain a healthy body weight or to lose weight. Ask what an ideal weight is for you.  Get at least 30 minutes of exercise most days of the week. Activities may include walking, swimming, or biking.  Include exercise to strengthen your muscles (resistance exercise), such as Pilates or lifting weights, as part of your weekly exercise routine. Try to do these types of exercises for 30 minutes at least 3 days a week.  Do not use any products that contain nicotine or tobacco, such as cigarettes, e-cigarettes, and chewing tobacco. If you need help quitting, ask your health care provider.  Monitor your blood pressure at home as told by your health care provider.  Keep all follow-up visits as told by your health care provider. This is important. Medicines  Take over-the-counter and prescription medicines only as told by your health care provider. Follow directions carefully. Blood pressure medicines must be taken as prescribed.  Do not skip doses of blood pressure medicine. Doing this  puts you at risk for problems and can make the medicine less effective.  Ask your health care provider about side effects or reactions to medicines that you should watch for. Contact a health care provider if you:  Think you are having a reaction to a medicine you are taking.  Have headaches that keep coming back (recurring).  Feel dizzy.  Have swelling in your ankles.  Have trouble with your vision. Get help right away if you:  Develop a severe headache or confusion.  Have unusual weakness or numbness.  Feel faint.  Have severe pain in your chest or abdomen.  Vomit repeatedly.  Have trouble breathing. Summary  Hypertension is when the force of blood pumping through your arteries is too strong. If this condition is not controlled, it may put you at risk for serious complications.  Your personal target blood pressure may vary depending on your medical conditions, your age, and other factors. For most people, a normal blood pressure is less than 120/80.  Hypertension is treated with lifestyle changes, medicines, or a combination of both. Lifestyle changes include losing weight, eating a healthy, low-sodium diet, exercising more, and limiting alcohol. This information is not intended to replace advice given to you by your health care provider. Make sure you discuss any questions you have with your health care provider. Document Revised: 02/27/2018 Document Reviewed: 02/27/2018 Elsevier Patient Education  2020 [employer].

## 2020-06-03 NOTE — Progress Notes (Signed)
New Patient Note  RE: William Jefferson MRN: [id number] DOB: 1946/01/27 Date of Office Visit: 06/03/2020  Chief Complaint: New Patient (Initial Visit)  History of Present Illness:  William Jefferson presents for .   Encounter for general adult medical examination Physical: Patient's last physical exam was 1 year ago .  Weight: Appropriate for height (BMI less than 27%) ;  Blood Pressure: Normal (BP less than 120/80) ;  Medical History: Patient history reviewed ; Family history reviewed ;  Allergies Reviewed: No change in current allergies ;  Medications Reviewed: Medications reviewed - no changes ;  Lipids: Normal lipid levels ;  Smoking: Life-long non-smoker ; Quit 2009 Physical Activity: Exercises at least 3 times per week ; No Alcohol/Drug Use: Is a non-drinker ; No illicit drug use ;  Patient is not afflicted from Stress Incontinence and Urge Incontinence  Safety: reviewed ; Patient wears a seat belt, has smoke detectors, has carbon monoxide detectors, and wears sunscreen with extended sun exposure. Dental Care: biannual cleanings, brushes and flosses daily. Ophthalmology/Optometry: Annual visit.  Hearing loss: none Vision impairments: none  Assessment and Plan: William Jefferson is a 74 y.o. male with: Annual physical exam Patient is a 74 year old male who presents to clinic for an annual physical exam.  Completed head to toe assessment.  Patient reports right knee pain and right hip pain radiating down right upper thigh.  Patient was referred to [facility] a few years ago for the same symptoms.  Patient decided to go with chiropractic but now has unresolved pain in right hip.  Patient will prefer to make his own appointment back to [facility] for reassessment of right hip pain and knee pain.  Patient is using nothing for symptoms alleviation at this time.  Advised patient to apply ice on joints, Voltaren gel and Tylenol as needed. Labs completed-CBC, CMP, lipid  panel,  Education provided on preventative and health maintenance printed handouts given to patient. Patient refuses colonoscopy. Rx refill sent to pharmacy, follow-up in 1 year for routine physical and follow-up with worsening or unresolved symptoms for right hip and knee pain.     Essential hypertension Essential hypertension not well controlled on lisinopril 5 mg tablet by mouth daily and metoprolol 25 mg tablet half tablet twice daily.  Increased lisinopril from 5 mg to 10 mg tablet daily.  Advised patient to keep 1 week blood pressure log and call results to the office, follow-up in 2 weeks to reassess blood pressure. Rx sent to pharmacy. Education provided with printed handouts given. Continue healthy diet and exercise regimen as tolerated  Return in about 2 weeks (around 06/17/2020).   Diagnostics:   Past Medical History: Patient Active Problem List   Diagnosis Date Noted  . Annual physical exam 06/03/2020  . Right hip pain 06/03/2020  . Dyslipidemia 08/21/2016  . Statin intolerance 08/21/2016  . CAD (coronary artery disease) 08/14/2013  . Mixed hyperlipidemia 08/14/2013  . Essential hypertension 08/14/2013   Past Medical History:  Diagnosis Date  . Bladder stone    removed in 2014 by Dr. Jeffie Pollock  . Cataract   . Coronary artery disease   . Hyperlipidemia    statin intolerance  . Hypertension    Past Surgical History: Past Surgical History:  Procedure Laterality Date  . CARDIAC CATHETERIZATION  06/10/1998   large P150 iliac stent to RCA - 3.5x23mm NIR stent (Dr. Marella Chimes)  . CARDIAC CATHETERIZATION  07/16/2007   mid-distal LAD stenosis, normal LV function, 2.5x84mm Promus stent  to obtuse marginal, 2.5x20mm Promus stent to osital & proximal diagonal (Dr. Marella Chimes, Dr. Domenic Moras)  . MELANOMA EXCISION  2018  . NM MYOCAR PERF WALL MOTION  08/2008   bruce myoview; normal pattern of perfusion, EF 77%  . PROSTATE BIOPSY  2002  . TRANSTHORACIC ECHOCARDIOGRAM   08/2012   mild conc LVH   Medication List:  Current Outpatient Medications  Medication Sig Dispense Refill  . AMBULATORY NON FORMULARY MEDICATION Take 180 mg by mouth daily. Medication Name:bempedoic acid vs a placebo, CLEAR Research Study drug provided    . cephALEXin (KEFLEX) 500 MG capsule Take by mouth.    . clopidogrel (PLAVIX) 75 MG tablet TAKE 1 TABLET DAILY 90 tablet 3  . Lutein 20 MG CAPS Take 1 capsule by mouth daily.    . metoprolol tartrate (LOPRESSOR) 25 MG tablet TAKE (1/2) TABLET TWICE DAILY. 90 tablet 3  . nitroGLYCERIN (NITROSTAT) 0.4 MG SL tablet Place 1 tablet (0.4 mg total) under the tongue every 5 (five) minutes as needed for chest pain. 25 tablet 3  . Saw Palmetto 450 MG CAPS Take 2 capsules by mouth daily.     . vitamin C (ASCORBIC ACID) 500 MG tablet Take 500 mg by mouth daily.    . diclofenac Sodium (VOLTAREN) 1 % GEL Apply 2 g topically 4 (four) times daily. 50 g 2  . lisinopril (ZESTRIL) 10 MG tablet Take 1 tablet (10 mg total) by mouth daily.     No current facility-administered medications for this visit.   Allergies: Allergies  Allergen Reactions  . Crestor [Rosuvastatin]   . Lipitor [Atorvastatin]   . Penicillins   . Sulfa Antibiotics   . Zetia [Ezetimibe]   . Zocor [Simvastatin]    Social History: Social History   Socioeconomic History  . Marital status: Married    Spouse name: Not on file  . Number of children: 3  . Years of education: 16  . Highest education level: Bachelor's degree (e.g., BA, AB, BS)  Occupational History    Employer: [employer]  Tobacco Use  . Smoking status: Former Smoker    Packs/day: 3.00    Years: 30.00    Pack years: 90.00    Quit date: 08/03/1997    Years since quitting: 22.8  . Smokeless tobacco: Never Used  Vaping Use  . Vaping Use: Never used  Substance and Sexual Activity  . Alcohol use: Not Currently  . Drug use: Not Currently  . Sexual activity: Not Currently  Other Topics Concern  . Not on file   Social History Narrative  . Not on file   Social Determinants of Health   Financial Resource Strain:   . Difficulty of Paying Living Expenses: Not on file  Food Insecurity:   . Worried About Charity fundraiser in the Last Year: Not on file  . Ran Out of Food in the Last Year: Not on file  Transportation Needs:   . Lack of Transportation (Medical): Not on file  . Lack of Transportation (Non-Medical): Not on file  Physical Activity:   . Days of Exercise per Week: Not on file  . Minutes of Exercise per Session: Not on file  Stress:   . Feeling of Stress : Not on file  Social Connections:   . Frequency of Communication with Friends and Family: Not on file  . Frequency of Social Gatherings with Friends and Family: Not on file  . Attends Religious Services: Not on file  .  Active Member of Clubs or Organizations: Not on file  . Attends Archivist Meetings: Not on file  . Marital Status: Not on file       Family History: Family History  Problem Relation Age of Onset  . Cancer Mother        breast  . Emphysema Father   . Cirrhosis Brother   . Alcohol abuse Brother     Review of Systems  Musculoskeletal: Positive for back pain and joint swelling.  All other systems reviewed and are negative.  Objective: BP (!) 148/79   Pulse 76   Temp 99 F (37.2 C) (Temporal)   Ht 6\' 5"  (1.956 m)   Wt 209 lb (94.8 kg)   BMI 24.78 kg/m  Body mass index is 24.78 kg/m. Physical Exam Vitals reviewed.  Constitutional:      Appearance: Normal appearance. He is normal weight.  HENT:     Head: Normocephalic.     Right Ear: External ear normal. There is no impacted cerumen.     Left Ear: External ear normal. There is impacted cerumen.     Nose: Nose normal. No congestion.     Mouth/Throat:     Mouth: Mucous membranes are moist.     Pharynx: Oropharynx is clear.  Eyes:     Conjunctiva/sclera: Conjunctivae normal.  Cardiovascular:     Rate and Rhythm: Normal rate and  regular rhythm.     Pulses: Normal pulses.     Heart sounds: Normal heart sounds.  Pulmonary:     Effort: Pulmonary effort is normal.     Breath sounds: Normal breath sounds.  Abdominal:     General: Bowel sounds are normal.  Musculoskeletal:        General: Swelling present.     Cervical back: Normal range of motion.  Skin:    General: Skin is warm.  Neurological:     Mental Status: He is alert.  Psychiatric:        Mood and Affect: Mood normal.        Behavior: Behavior normal.    The plan was reviewed with the patient/family, and all questions/concerned were addressed.  It was my pleasure to see William Jefferson today and participate in his care. Please feel free to contact me with any questions or concerns.  Sincerely,  Jac Canavan NP [place]

## 2020-06-04 DIAGNOSIS — Z87442 Personal history of urinary calculi: Secondary | ICD-10-CM | POA: Diagnosis not present

## 2020-06-04 DIAGNOSIS — R972 Elevated prostate specific antigen [PSA]: Secondary | ICD-10-CM | POA: Diagnosis not present

## 2020-06-04 DIAGNOSIS — N411 Chronic prostatitis: Secondary | ICD-10-CM | POA: Diagnosis not present

## 2020-06-04 DIAGNOSIS — N4232 Atypical small acinar proliferation of prostate: Secondary | ICD-10-CM | POA: Diagnosis not present

## 2020-06-04 LAB — COMPREHENSIVE METABOLIC PANEL
ALT: 19 IU/L (ref 0–44)
AST: 13 IU/L (ref 0–40)
Albumin/Globulin Ratio: 1.7 (ref 1.2–2.2)
Albumin: 4.5 g/dL (ref 3.7–4.7)
Alkaline Phosphatase: 72 IU/L (ref 44–121)
BUN/Creatinine Ratio: 17 (ref 10–24)
BUN: 19 mg/dL (ref 8–27)
Bilirubin Total: 0.4 mg/dL (ref 0.0–1.2)
CO2: 26 mmol/L (ref 20–29)
Calcium: 9.9 mg/dL (ref 8.6–10.2)
Chloride: 104 mmol/L (ref 96–106)
Creatinine, Ser: 1.12 mg/dL (ref 0.76–1.27)
GFR calc Af Amer: 74 mL/min/{1.73_m2} (ref >59)
GFR calc non Af Amer: 64 mL/min/{1.73_m2} (ref >59)
Globulin, Total: 2.6 g/dL (ref 1.5–4.5)
Glucose: 99 mg/dL (ref 65–99)
Potassium: 4.2 mmol/L (ref 3.5–5.2)
Sodium: 142 mmol/L (ref 134–144)
Total Protein: 7.1 g/dL (ref 6.0–8.5)

## 2020-06-04 LAB — CBC WITH DIFFERENTIAL/PLATELET
Basophils Absolute: 0 10*3/uL (ref 0.0–0.2)
Basos: 1 %
EOS (ABSOLUTE): 0.4 10*3/uL (ref 0.0–0.4)
Eos: 5 %
Hematocrit: 45.4 % (ref 37.5–51.0)
Hemoglobin: 15.7 g/dL (ref 13.0–17.7)
Immature Grans (Abs): 0.1 10*3/uL (ref 0.0–0.1)
Immature Granulocytes: 1 %
Lymphocytes Absolute: 2.2 10*3/uL (ref 0.7–3.1)
Lymphs: 26 %
MCH: 31.8 pg (ref 26.6–33.0)
MCHC: 34.6 g/dL (ref 31.5–35.7)
MCV: 92 fL (ref 79–97)
Monocytes Absolute: 0.9 10*3/uL (ref 0.1–0.9)
Monocytes: 11 %
Neutrophils Absolute: 4.7 10*3/uL (ref 1.4–7.0)
Neutrophils: 56 %
Platelets: 272 10*3/uL (ref 150–450)
RBC: 4.94 x10E6/uL (ref 4.14–5.80)
RDW: 13 % (ref 11.6–15.4)
WBC: 8.4 10*3/uL (ref 3.4–10.8)

## 2020-06-04 LAB — LIPID PANEL
Chol/HDL Ratio: 7.6 ratio — ABNORMAL HIGH (ref 0.0–5.0)
Cholesterol, Total: 198 mg/dL (ref 100–199)
HDL: 26 mg/dL — ABNORMAL LOW (ref >39)
LDL Chol Calc (NIH): 131 mg/dL — ABNORMAL HIGH (ref 0–99)
Triglycerides: 229 mg/dL — ABNORMAL HIGH (ref 0–149)
VLDL Cholesterol Cal: 41 mg/dL — ABNORMAL HIGH (ref 5–40)

## 2020-06-05 ENCOUNTER — Other Ambulatory Visit: Payer: Self-pay | Admitting: Nurse Practitioner

## 2020-06-05 MED ORDER — OMEGA-3 FATTY ACIDS 1000 MG PO CAPS: 2.0000 g | ORAL_CAPSULE | Freq: Every day | ORAL | 1 refills | Status: DC

## 2020-06-07 ENCOUNTER — Telehealth: Payer: Self-pay

## 2020-06-07 DIAGNOSIS — M5137 Other intervertebral disc degeneration, lumbosacral region: Secondary | ICD-10-CM | POA: Diagnosis not present

## 2020-06-07 DIAGNOSIS — M9903 Segmental and somatic dysfunction of lumbar region: Secondary | ICD-10-CM | POA: Diagnosis not present

## 2020-06-07 DIAGNOSIS — M9902 Segmental and somatic dysfunction of thoracic region: Secondary | ICD-10-CM | POA: Diagnosis not present

## 2020-06-07 DIAGNOSIS — M9901 Segmental and somatic dysfunction of cervical region: Secondary | ICD-10-CM | POA: Diagnosis not present

## 2020-06-07 NOTE — Telephone Encounter (Signed)
Patient started checking BP after visit on 12/03 it was 151/88. Saturday and Sunday was not able to check. Today this morning it was 171/100 and this afternoon it was 168/95. Patient was just upped lisiopril 10mg  was on 5 mg. Patient also taking metoprolol 25 mg 1/2 tab bid. Please advise. Patient just feels it is too high to wait for appointment in 12/14.

## 2020-06-07 NOTE — Telephone Encounter (Signed)
Take Metroprolol 25 mg 1 tablet twice a day instead of half tablet twice a day.  Continue to monitor blood pressure and call in values in the next few days

## 2020-06-08 NOTE — Telephone Encounter (Signed)
Patient aware and verbalizes understanding. 

## 2020-06-09 DIAGNOSIS — M9902 Segmental and somatic dysfunction of thoracic region: Secondary | ICD-10-CM | POA: Diagnosis not present

## 2020-06-09 DIAGNOSIS — M9903 Segmental and somatic dysfunction of lumbar region: Secondary | ICD-10-CM | POA: Diagnosis not present

## 2020-06-09 DIAGNOSIS — M9901 Segmental and somatic dysfunction of cervical region: Secondary | ICD-10-CM | POA: Diagnosis not present

## 2020-06-09 DIAGNOSIS — M5137 Other intervertebral disc degeneration, lumbosacral region: Secondary | ICD-10-CM | POA: Diagnosis not present

## 2020-06-11 ENCOUNTER — Encounter: Payer: Self-pay | Admitting: *Deleted

## 2020-06-14 DIAGNOSIS — M9902 Segmental and somatic dysfunction of thoracic region: Secondary | ICD-10-CM | POA: Diagnosis not present

## 2020-06-14 DIAGNOSIS — M5137 Other intervertebral disc degeneration, lumbosacral region: Secondary | ICD-10-CM | POA: Diagnosis not present

## 2020-06-14 DIAGNOSIS — M9903 Segmental and somatic dysfunction of lumbar region: Secondary | ICD-10-CM | POA: Diagnosis not present

## 2020-06-14 DIAGNOSIS — M9901 Segmental and somatic dysfunction of cervical region: Secondary | ICD-10-CM | POA: Diagnosis not present

## 2020-06-15 DIAGNOSIS — M5416 Radiculopathy, lumbar region: Secondary | ICD-10-CM | POA: Diagnosis not present

## 2020-06-16 ENCOUNTER — Ambulatory Visit (INDEPENDENT_AMBULATORY_CARE_PROVIDER_SITE_OTHER): Payer: Federal, State, Local not specified - PPO | Admitting: Nurse Practitioner

## 2020-06-16 ENCOUNTER — Encounter: Payer: Self-pay | Admitting: Nurse Practitioner

## 2020-06-16 ENCOUNTER — Other Ambulatory Visit: Payer: Self-pay

## 2020-06-16 VITALS — BP 136/72 | HR 68 | Temp 97.2°F | Resp 20 | Ht 77.0 in | Wt 209.0 lb

## 2020-06-16 DIAGNOSIS — I10 Essential (primary) hypertension: Secondary | ICD-10-CM | POA: Diagnosis not present

## 2020-06-16 NOTE — Patient Instructions (Signed)

## 2020-06-16 NOTE — Progress Notes (Signed)
Established Patient Office Visit  Subjective:  Patient ID: William Jefferson, male    DOB: Feb 17, 1946  Age: 74 y.o. MRN: [id number]  CC:  Chief Complaint  Patient presents with  . Medical Management of Chronic Issues    2 week follow up   . Hypertension    HPI William Jefferson presents for follow up of hypertension. Patient was diagnosed in .  08/14/2013 the patient is tolerating the medication well without side effects. Compliance with treatment has been good; including taking medication as directed , maintains a healthy diet and following up as directed.  Current medication lisinopril 20 mg tablet by mouth daily, Metroprolol 25 mg tablet by mouth twice daily.  Past Medical History:  Diagnosis Date  . Bladder stone    removed in 2014 by Dr. Jeffie Pollock  . Cataract   . Coronary artery disease   . Hyperlipidemia    statin intolerance  . Hypertension     Past Surgical History:  Procedure Laterality Date  . CARDIAC CATHETERIZATION  06/10/1998   large P150 iliac stent to RCA - 3.5x89mm NIR stent (Dr. Marella Chimes)  . CARDIAC CATHETERIZATION  07/16/2007   mid-distal LAD stenosis, normal LV function, 2.5x81mm Promus stent to obtuse marginal, 2.5x53mm Promus stent to osital & proximal diagonal (Dr. Marella Chimes, Dr. Domenic Moras)  . MELANOMA EXCISION  2018  . NM MYOCAR PERF WALL MOTION  08/2008   bruce myoview; normal pattern of perfusion, EF 77%  . PROSTATE BIOPSY  2002  . TRANSTHORACIC ECHOCARDIOGRAM  08/2012   mild conc LVH    Family History  Problem Relation Age of Onset  . Cancer Mother        breast  . Emphysema Father   . Cirrhosis Brother   . Alcohol abuse Brother     Social History   Socioeconomic History  . Marital status: Married    Spouse name: Not on file  . Number of children: 3  . Years of education: 16  . Highest education level: Bachelor's degree (e.g., BA, AB, BS)  Occupational History    Employer: [employer]  Tobacco Use  . Smoking status: Former  Smoker    Packs/day: 3.00    Years: 30.00    Pack years: 90.00    Quit date: 08/03/1997    Years since quitting: 22.8  . Smokeless tobacco: Never Used  Vaping Use  . Vaping Use: Never used  Substance and Sexual Activity  . Alcohol use: Not Currently  . Drug use: Not Currently  . Sexual activity: Not Currently  Other Topics Concern  . Not on file  Social History Narrative  . Not on file   Social Determinants of Health   Financial Resource Strain: Not on file  Food Insecurity: Not on file  Transportation Needs: Not on file  Physical Activity: Not on file  Stress: Not on file  Social Connections: Not on file  Intimate Partner Violence: Not on file    Outpatient Medications Prior to Visit  Medication Sig Dispense Refill  . AMBULATORY NON FORMULARY MEDICATION Take 180 mg by mouth daily. Medication Name:bempedoic acid vs a placebo, CLEAR Research Study drug provided    . cephALEXin (KEFLEX) 500 MG capsule Take by mouth.    . clopidogrel (PLAVIX) 75 MG tablet TAKE 1 TABLET DAILY 90 tablet 3  . diclofenac Sodium (VOLTAREN) 1 % GEL Apply 2 g topically 4 (four) times daily. 50 g 2  . fish oil-omega-3 fatty acids 1000 MG  capsule Take 2 capsules (2 g total) by mouth daily. 90 capsule 1  . lisinopril (ZESTRIL) 10 MG tablet Take 1 tablet (10 mg total) by mouth daily.    . Lutein 20 MG CAPS Take 1 capsule by mouth daily.    . metoprolol tartrate (LOPRESSOR) 25 MG tablet TAKE (1/2) TABLET TWICE DAILY. (Patient taking differently: Take 25 mg by mouth 2 (two) times daily.) 90 tablet 3  . Saw Palmetto 450 MG CAPS Take 2 capsules by mouth daily.     . vitamin C (ASCORBIC ACID) 500 MG tablet Take 500 mg by mouth daily.    . nitroGLYCERIN (NITROSTAT) 0.4 MG SL tablet Place 1 tablet (0.4 mg total) under the tongue every 5 (five) minutes as needed for chest pain. (Patient not taking: Reported on 06/16/2020) 25 tablet 3   No facility-administered medications prior to visit.    Allergies  Allergen  Reactions  . Crestor [Rosuvastatin]   . Lipitor [Atorvastatin]   . Penicillins   . Sulfa Antibiotics   . Zetia [Ezetimibe]   . Zocor [Simvastatin]     ROS Review of Systems  Neurological: Negative for dizziness, weakness, light-headedness, numbness and headaches.  All other systems reviewed and are negative.     Objective:    Physical Exam Vitals reviewed.  Constitutional:      General: He is awake.     Appearance: Normal appearance. He is well-groomed.     Interventions: Face mask in place.  HENT:     Head: Normocephalic.     Right Ear: External ear normal.  Eyes:     Conjunctiva/sclera: Conjunctivae normal.  Cardiovascular:     Rate and Rhythm: Normal rate and regular rhythm.     Pulses: Normal pulses.     Heart sounds: Normal heart sounds.  Pulmonary:     Effort: Pulmonary effort is normal.     Breath sounds: Normal breath sounds.  Abdominal:     General: Bowel sounds are normal.  Musculoskeletal:        General: Normal range of motion.  Skin:    General: Skin is warm.  Neurological:     Mental Status: He is alert and oriented to person, place, and time.  Psychiatric:        Mood and Affect: Mood normal.        Behavior: Behavior normal. Behavior is cooperative.     BP 136/72   Pulse 68   Temp (!) 97.2 F (36.2 C)   Resp 20   Ht 6\' 5"  (1.956 m)   Wt 209 lb (94.8 kg)   SpO2 100%   BMI 24.78 kg/m  Wt Readings from Last 3 Encounters:  06/16/20 209 lb (94.8 kg)  06/03/20 209 lb (94.8 kg)  04/15/20 208 lb (94.3 kg)     Health Maintenance Due  Topic Date Due  . Hepatitis C Screening  Never done  . COVID-19 Vaccine (1) Never done  . TETANUS/TDAP  Never done  . COLONOSCOPY  Never done    There are no preventive care reminders to display for this patient.  Lab Results  Component Value Date   TSH 1.660 02/12/2013   Lab Results  Component Value Date   WBC 8.4 06/03/2020   HGB 15.7 06/03/2020   HCT 45.4 06/03/2020   MCV 92 06/03/2020   PLT  272 06/03/2020   Lab Results  Component Value Date   NA 142 06/03/2020   K 4.2 06/03/2020   CO2 26 06/03/2020  GLUCOSE 99 06/03/2020   BUN 19 06/03/2020   CREATININE 1.12 06/03/2020   BILITOT 0.4 06/03/2020   ALKPHOS 72 06/03/2020   AST 13 06/03/2020   ALT 19 06/03/2020   PROT 7.1 06/03/2020   ALBUMIN 4.5 06/03/2020   CALCIUM 9.9 06/03/2020   Lab Results  Component Value Date   CHOL 198 06/03/2020   Lab Results  Component Value Date   HDL 26 (L) 06/03/2020   Lab Results  Component Value Date   LDLCALC 131 (H) 06/03/2020   Lab Results  Component Value Date   TRIG 229 (H) 06/03/2020   Lab Results  Component Value Date   CHOLHDL 7.6 (H) 06/03/2020   No results found for: HGBA1C    Assessment & Plan:   Problem List Items Addressed This Visit      Cardiovascular and Mediastinum   Essential hypertension - Primary    Essential hypertension well managed on current medication lisinopril 20 mg tablet by mouth daily, Metroprolol 25 mg tablet by mouth twice daily.. Continue a healthy low-sodium diet and exercise regimen as tolerated. Provided education with printed handouts given. Refill sent to pharmacy. Follow-up in 3 months.      Relevant Medications   lisinopril (ZESTRIL) 10 MG tablet   metoprolol tartrate (LOPRESSOR) 25 MG tablet      Meds ordered this encounter  Medications  . lisinopril (ZESTRIL) 10 MG tablet    Sig: Take 1 tablet (10 mg total) by mouth daily.    Order Specific Question:   Supervising Provider    Answer:   Caryl Pina A [id number]  . metoprolol tartrate (LOPRESSOR) 25 MG tablet    Sig: Take 1 tablet (25 mg total) by mouth 2 (two) times daily.    Dispense:  90 tablet    Refill:  1    Order Specific Question:   Supervising Provider    Answer:   Caryl Pina A [[id number]]    Follow-up: Return in about 3 months (around 09/14/2020).    [name], NP

## 2020-06-17 DIAGNOSIS — M9902 Segmental and somatic dysfunction of thoracic region: Secondary | ICD-10-CM | POA: Diagnosis not present

## 2020-06-17 DIAGNOSIS — M9901 Segmental and somatic dysfunction of cervical region: Secondary | ICD-10-CM | POA: Diagnosis not present

## 2020-06-17 DIAGNOSIS — M5137 Other intervertebral disc degeneration, lumbosacral region: Secondary | ICD-10-CM | POA: Diagnosis not present

## 2020-06-17 DIAGNOSIS — M9903 Segmental and somatic dysfunction of lumbar region: Secondary | ICD-10-CM | POA: Diagnosis not present

## 2020-06-17 MED ORDER — METOPROLOL TARTRATE 25 MG PO TABS: 25.0000 mg | ORAL_TABLET | Freq: Two times a day (BID) | ORAL | 1 refills | Status: DC

## 2020-06-17 MED ORDER — LISINOPRIL 10 MG PO TABS: 10.0000 mg | ORAL_TABLET | Freq: Every day | ORAL | Status: DC

## 2020-06-17 NOTE — Assessment & Plan Note (Signed)
Essential hypertension well managed on current medication lisinopril 20 mg tablet by mouth daily, Metroprolol 25 mg tablet by mouth twice daily.. Continue a healthy low-sodium diet and exercise regimen as tolerated. Provided education with printed handouts given. Refill sent to pharmacy. Follow-up in 3 months.

## 2020-06-18 ENCOUNTER — Other Ambulatory Visit: Payer: Self-pay | Admitting: *Deleted

## 2020-06-18 DIAGNOSIS — I10 Essential (primary) hypertension: Secondary | ICD-10-CM

## 2020-06-18 MED ORDER — LISINOPRIL 10 MG PO TABS: 10.0000 mg | ORAL_TABLET | Freq: Every day | ORAL | 1 refills | Status: DC

## 2020-06-21 DIAGNOSIS — M9903 Segmental and somatic dysfunction of lumbar region: Secondary | ICD-10-CM | POA: Diagnosis not present

## 2020-06-21 DIAGNOSIS — M9902 Segmental and somatic dysfunction of thoracic region: Secondary | ICD-10-CM | POA: Diagnosis not present

## 2020-06-21 DIAGNOSIS — M5137 Other intervertebral disc degeneration, lumbosacral region: Secondary | ICD-10-CM | POA: Diagnosis not present

## 2020-06-21 DIAGNOSIS — M25561 Pain in right knee: Secondary | ICD-10-CM | POA: Diagnosis not present

## 2020-06-21 DIAGNOSIS — M9901 Segmental and somatic dysfunction of cervical region: Secondary | ICD-10-CM | POA: Diagnosis not present

## 2020-06-23 DIAGNOSIS — M25561 Pain in right knee: Secondary | ICD-10-CM | POA: Diagnosis not present

## 2020-06-24 DIAGNOSIS — M9903 Segmental and somatic dysfunction of lumbar region: Secondary | ICD-10-CM | POA: Diagnosis not present

## 2020-06-24 DIAGNOSIS — M9902 Segmental and somatic dysfunction of thoracic region: Secondary | ICD-10-CM | POA: Diagnosis not present

## 2020-06-24 DIAGNOSIS — M9901 Segmental and somatic dysfunction of cervical region: Secondary | ICD-10-CM | POA: Diagnosis not present

## 2020-06-24 DIAGNOSIS — M5137 Other intervertebral disc degeneration, lumbosacral region: Secondary | ICD-10-CM | POA: Diagnosis not present

## 2020-06-29 DIAGNOSIS — M25561 Pain in right knee: Secondary | ICD-10-CM | POA: Diagnosis not present

## 2020-06-30 DIAGNOSIS — M5416 Radiculopathy, lumbar region: Secondary | ICD-10-CM | POA: Diagnosis not present

## 2020-07-07 ENCOUNTER — Telehealth: Payer: Self-pay

## 2020-07-07 DIAGNOSIS — Z006 Encounter for examination for normal comparison and control in clinical research program: Secondary | ICD-10-CM

## 2020-07-07 NOTE — Telephone Encounter (Signed)
Subject was called for CLEAR T13 M33 telephone visit. No AE/SAE to report. Concomitant medications reviewed. Confirmed next scheduled visit for 10/07/2020.

## 2020-07-08 DIAGNOSIS — S83231D Complex tear of medial meniscus, current injury, right knee, subsequent encounter: Secondary | ICD-10-CM | POA: Diagnosis not present

## 2020-07-08 DIAGNOSIS — M25561 Pain in right knee: Secondary | ICD-10-CM | POA: Diagnosis not present

## 2020-07-08 DIAGNOSIS — S83249A Other tear of medial meniscus, current injury, unspecified knee, initial encounter: Secondary | ICD-10-CM | POA: Insufficient documentation

## 2020-07-15 ENCOUNTER — Telehealth: Payer: Self-pay | Admitting: *Deleted

## 2020-07-15 NOTE — Telephone Encounter (Signed)
   Primary Cardiologist: Pixie Casino, MD  Chart reviewed as part of pre-operative protocol coverage. Because of Hildred G Danielsen's past medical history and time since last visit, he will require a follow-up visit in order to better assess preoperative cardiovascular risk.  Pre-op covering staff: - Please schedule appointment and call patient to inform them. If patient already had an upcoming appointment within acceptable timeframe, please add "pre-op clearance" to the appointment notes so provider is aware. - Please contact requesting surgeon's office via preferred method (i.e, phone, fax) to inform them of need for appointment prior to surgery.  If applicable, this message will also be routed to pharmacy pool and/or primary cardiologist for input on holding anticoagulant/antiplatelet agent as requested below so that this information is available to the clearing provider at time of patient's appointment.   [place], [place]  07/15/2020, 3:12 PM

## 2020-07-15 NOTE — Telephone Encounter (Signed)
   [facility] Pre-operative Risk Assessment    HEARTCARE STAFF: - Please ensure there is not already an duplicate clearance open for this procedure. - Under Visit Info/Reason for Call, type in Other and utilize the format Clearance MM/DD/YY or Clearance TBD. Do not use dashes or single digits. - If request is for dental extraction, please clarify the # of teeth to be extracted.  Request for surgical clearance:  1. What type of surgery is being performed? Right Knee Scope   2. When is this surgery scheduled? TBD   3. What type of clearance is required (medical clearance vs. Pharmacy clearance to hold med vs. Both)? Medical  4. Are there any medications that need to be held prior to surgery and how long? Plavix   5. Practice name and name of physician performing surgery? [facility] Dr Esmond Plants   6. What is the office phone number? [phone]   7.   What is the office fax number? [phone] Attn: Adline Potter  8.   Anesthesia type (None, local, MAC, general) ? Georgiann Hahn 07/15/2020, 12:22 PM  _________________________________________________________________   (provider comments below)

## 2020-07-15 NOTE — Telephone Encounter (Signed)
Pt need appointment with Dr Debara Pickett for pre op clearance

## 2020-07-20 ENCOUNTER — Ambulatory Visit: Payer: Federal, State, Local not specified - PPO | Admitting: Cardiology

## 2020-07-21 ENCOUNTER — Other Ambulatory Visit: Payer: Self-pay | Admitting: Nurse Practitioner

## 2020-07-23 ENCOUNTER — Ambulatory Visit: Payer: Federal, State, Local not specified - PPO | Admitting: Medical

## 2020-07-24 NOTE — Progress Notes (Deleted)
Cardiology Office Note   Date:  07/24/2020   ID:  William Jefferson, DOB 10-Sep-1945, MRN [id number]  PCP:  William Lynn, NP  Cardiologist:  William Casino, MD EP: None  No chief complaint on file.     History of Present Illness: William Jefferson is a 75 y.o. male with a PMH of CADs/p PCI with iliac stent to RCA in 1999 as coronary stents of that size were not available at that time, as well as subsequent PCI/DES to OM1 and D1 in 2009, HTN, and HLD who presents for preoperative evaluation.   He was last evaluated by cardiology at an outpatient visit with Dr. Debara Jefferson 09/2019, at which time he was doing well from a cardiac standpoint. 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 He is a participant in the CLEAR trial given several intolerances to statins. He was recommended to follow-up in 1 year or sooner if needed. His last echocardiogram in 2014 showed normal LV function, mild concentric LVH, and no significant valvular abnormalities. His last ischemic evaluation was a NST in 2010 which was without ischemia  1. Preoperative evaluation: patient is anticipating right knee scope.  - Based on ACC/AHA guidelines, William Jefferson would be at acceptable risk for the planned procedure without further cardiovascular testing.  - The patient was advised that if he develops new symptoms prior to surgery to contact our office to arrange for a follow-up visit, and he verbalized understanding. - I will route this recommendation to the requesting party via [place] fax function. - Patient can hold plavix 5 days prior to his upcoming surgery and should restart as soon as he is cleared to do so by his surgeon.  2. CAD s/p PCI to RCA in 1999 and PCI/DES to OM1 and D1 in 2009: no anginal complaints.  - Continue plavix - Continue omega 3 and CLEAR study drug (bempedoic acid vs placebo) - Continue BBlocker  3. HTN: BP *** today - Continue lisinopril and metoprolol  4. HLD:  lipids have been blinded due to study participation - Continue omega 3 and CLEAR study drug (bempedoic acid vs placebo)      Past Medical History:  Diagnosis Date  . Bladder stone    removed in 2014 by Dr. Jeffie Jefferson  . Cataract   . Coronary artery disease   . Hyperlipidemia    statin intolerance  . Hypertension     Past Surgical History:  Procedure Laterality Date  . CARDIAC CATHETERIZATION  06/10/1998   large P150 iliac stent to RCA - 3.5x63mm NIR stent (Dr. Marella Jefferson)  . CARDIAC CATHETERIZATION  07/16/2007   mid-distal LAD stenosis, normal LV function, 2.5x18mm Promus stent to obtuse marginal, 2.5x65mm Promus stent to osital & proximal diagonal (Dr. Marella Jefferson, Dr. Domenic Jefferson)  . MELANOMA EXCISION  2018  . NM MYOCAR PERF WALL MOTION  08/2008   bruce myoview; normal pattern of perfusion, EF 77%  . PROSTATE BIOPSY  2002  . TRANSTHORACIC ECHOCARDIOGRAM  08/2012   mild conc LVH     Current Outpatient Medications  Medication Sig Dispense Refill  . AMBULATORY NON FORMULARY MEDICATION Take 180 mg by mouth daily. Medication Name:bempedoic acid vs a placebo, CLEAR Research Study drug provided    . cephALEXin (KEFLEX) 500 MG capsule Take by mouth.    . clopidogrel (PLAVIX) 75 MG tablet TAKE 1 TABLET DAILY 90 tablet 3  . diclofenac Sodium (VOLTAREN) 1 % GEL Apply 2  g topically 4 (four) times daily. 50 g 2  . fish oil-omega-3 fatty acids 1000 MG capsule Take 2 capsules (2 g total) by mouth daily. 90 capsule 1  . lisinopril (ZESTRIL) 10 MG tablet Take 1 tablet (10 mg total) by mouth daily. 90 tablet 1  . Lutein 20 MG CAPS Take 1 capsule by mouth daily.    . metoprolol tartrate (LOPRESSOR) 25 MG tablet Take 1 tablet (25 mg total) by mouth 2 (two) times daily. 90 tablet 1  . nitroGLYCERIN (NITROSTAT) 0.4 MG SL tablet Place 1 tablet (0.4 mg total) under the tongue every 5 (five) minutes as needed for chest pain. (Patient not taking: Reported on 06/16/2020) 25 tablet 3  . Saw Palmetto 450 MG  CAPS Take 2 capsules by mouth daily.     . vitamin C (ASCORBIC ACID) 500 MG tablet Take 500 mg by mouth daily.     No current facility-administered medications for this visit.    Allergies:   Crestor [rosuvastatin], Lipitor [atorvastatin], Penicillins, Sulfa antibiotics, Zetia [ezetimibe], and Zocor [simvastatin]    Social History:  The patient  reports that he quit smoking about 22 years ago. He has a 90.00 pack-year smoking history. He has never used smokeless tobacco. He reports previous alcohol use. He reports previous drug use.   Family History:  The patient's ***family history includes Alcohol abuse in his brother; Cancer in his mother; Cirrhosis in his brother; Emphysema in his father.    ROS:  Please see the history of present illness.   Otherwise, review of systems are positive for {NONE DEFAULTED:18576::"none"}.   All other systems are reviewed and negative.    PHYSICAL EXAM: VS:  There were no vitals taken for this visit. , BMI There is no height or weight on file to calculate BMI. GEN: Well nourished, well developed, in no acute distress HEENT: normal Neck: no JVD, carotid bruits, or masses Cardiac: ***RRR; no murmurs, rubs, or gallops,no edema  Respiratory:  clear to auscultation bilaterally, normal work of breathing GI: soft, nontender, nondistended, + BS MS: no deformity or atrophy Skin: warm and dry, no rash Neuro:  Strength and sensation are intact Psych: euthymic mood, full affect   EKG:  EKG {ACTION; IS/IS GHW:[id number]} ordered today. The ekg ordered today demonstrates ***   Recent Labs: 06/03/2020: ALT 19; BUN 19; Creatinine, Ser 1.12; Hemoglobin 15.7; Platelets 272; Potassium 4.2; Sodium 142    Lipid Panel    Component Value Date/Time   CHOL 198 06/03/2020 1612   TRIG 229 (H) 06/03/2020 1612   TRIG 87 08/31/2016 0909   HDL 26 (L) 06/03/2020 1612   HDL 31 (L) 08/31/2016 0909   CHOLHDL 7.6 (H) 06/03/2020 1612   CHOLHDL 5.9 12/25/2014 0921   VLDL 25  12/25/2014 0921   LDLCALC 131 (H) 06/03/2020 1612      Wt Readings from Last 3 Encounters:  06/16/20 209 lb (94.8 kg)  06/03/20 209 lb (94.8 kg)  04/15/20 208 lb (94.3 kg)      Other studies Reviewed: Additional studies/ records that were reviewed today include:   Echocardiogram 2014: - Left ventricle: The cavity size was normal. There was mild  concentric hypertrophy. Left ventricular diastolic  function parameters were normal.  - Atrial septum: No defect or patent foramen ovale was  identified.      ASSESSMENT AND PLAN:  1.  ***   Current medicines are reviewed at length with the patient today.  The patient {ACTIONS; HAS/DOES NOT HAVE:19233} concerns regarding  medicines.  The following changes have been made:  {PLAN; NO CHANGE:13088:s}  Labs/ tests ordered today include: *** No orders of the defined types were placed in this encounter.    Disposition:   FU with *** in {gen number [id number] {Days to years:10300}  Signed, Abigail Butts, PA-C  07/24/2020 2:49 PM

## 2020-07-26 NOTE — Telephone Encounter (Signed)
Done

## 2020-07-26 NOTE — Telephone Encounter (Signed)
Ok to hold Plavix up to 5 days for knee surgery, restart after. Patient to see Roby Lofts on 1/27 for pre-op.  DR H

## 2020-07-26 NOTE — Telephone Encounter (Signed)
Patient will see Roby Lofts, PA-C 07/29/2020, for preoperative cardiac clearance.  Please add preop clearance to patient's notes.  I will defer clearance to her at this time and remove him from the preop pool.  Thank you.

## 2020-07-29 ENCOUNTER — Ambulatory Visit: Payer: Federal, State, Local not specified - PPO | Admitting: Medical

## 2020-07-29 DIAGNOSIS — E782 Mixed hyperlipidemia: Secondary | ICD-10-CM

## 2020-07-29 DIAGNOSIS — I2510 Atherosclerotic heart disease of native coronary artery without angina pectoris: Secondary | ICD-10-CM

## 2020-07-29 DIAGNOSIS — I10 Essential (primary) hypertension: Secondary | ICD-10-CM

## 2020-07-29 DIAGNOSIS — Z006 Encounter for examination for normal comparison and control in clinical research program: Secondary | ICD-10-CM

## 2020-07-29 DIAGNOSIS — Z01810 Encounter for preprocedural cardiovascular examination: Secondary | ICD-10-CM

## 2020-08-01 NOTE — Progress Notes (Unsigned)
Cardiology Office Note:   Date:  08/02/2020  NAME:  William Jefferson    MRN: [id number] DOB:  1945/12/27   PCP:  [name], NP  Cardiologist:  Pixie Casino, MD   Referring MD: [name], NP   Chief Complaint  Patient presents with  . Pre-op Exam    History of Present Illness:   William Jefferson is a 75 y.o. male with a hx of CAD, HTN, HLD who presents for preoperative assessment.  He has been fairly stable under the care of Dr. Mali Hilty.  His EKG today shows he is in atrial fibrillation that is rate controlled with heart rate 79.  He reports he is unaware of this diagnosis.  He denies any rapid heartbeat sensation.  He reports no chest pain or trouble breathing.  He apparently will have a right knee arthroscopy.  He reports knee pain is limiting him recently.  He reports he is walking 1/4 mile per day without any chest pain or shortness of breath.  He can climb a flight of stairs without any significant chest pain or shortness of breath.  Apparently his knee does bother him.  Is the right knee.  He reports no symptoms of heart failure.  No increased lower extremity edema.  No recent thyroid panel.  Lab work from primary care physician shows kidney function is normal.  He has had prior PCI's.  He is on Plavix.  We discussed going on aspirin as he will need to be on anticoagulation.  We also discussed getting an echocardiogram and blood work today.  He reports he would like to proceed with surgery not delay things.  I think this is reasonable.  Any attempted cardioversion or restoration of sinus rhythm would delay surgery.  For now he would like to proceed with surgery and then see Dr. Debara Pickett regarding his atrial fibrillation moving forward.  Problem List 1. CAD -POBA to ostial RCA -> 1999 -PCI to OM1 and Diagonal -> 2009 2. HLD -T chol 198, HDL 26, LDL 131, TG 229 3. HTN 4.  Atrial fibrillation -08/02/2020 -CHADSVASC= 3 (CAD, age, HTN)  Past Medical History: Past  Medical History:  Diagnosis Date  . Bladder stone    removed in 2014 by Dr. Jeffie Pollock  . Cataract   . Coronary artery disease   . Hyperlipidemia    statin intolerance  . Hypertension     Past Surgical History: Past Surgical History:  Procedure Laterality Date  . CARDIAC CATHETERIZATION  06/10/1998   large P150 iliac stent to RCA - 3.5x68mm NIR stent (Dr. Marella Chimes)  . CARDIAC CATHETERIZATION  07/16/2007   mid-distal LAD stenosis, normal LV function, 2.5x62mm Promus stent to obtuse marginal, 2.5x21mm Promus stent to osital & proximal diagonal (Dr. Marella Chimes, Dr. Domenic Moras)  . MELANOMA EXCISION  2018  . NM MYOCAR PERF WALL MOTION  08/2008   bruce myoview; normal pattern of perfusion, EF 77%  . PROSTATE BIOPSY  2002  . TRANSTHORACIC ECHOCARDIOGRAM  08/2012   mild conc LVH    Current Medications: Current Meds  Medication Sig  . AMBULATORY NON FORMULARY MEDICATION Take 180 mg by mouth daily. Medication Name:bempedoic acid vs a placebo, CLEAR Research Study drug provided  . apixaban (ELIQUIS) 5 MG TABS tablet Take 1 tablet (5 mg total) by mouth 2 (two) times daily.  Marland Kitchen aspirin EC 81 MG tablet Take 1 tablet (81 mg total) by mouth daily. Swallow whole.  . cephALEXin (KEFLEX) 500 MG  capsule Take by mouth.  . diclofenac Sodium (VOLTAREN) 1 % GEL Apply 2 g topically 4 (four) times daily.  . fish oil-omega-3 fatty acids 1000 MG capsule Take 2 capsules (2 g total) by mouth daily.  Marland Kitchen lisinopril (ZESTRIL) 10 MG tablet Take 1 tablet (10 mg total) by mouth daily.  . Lutein 20 MG CAPS Take 1 capsule by mouth daily.  . metoprolol tartrate (LOPRESSOR) 25 MG tablet Take 1 tablet (25 mg total) by mouth 2 (two) times daily.  . Saw Palmetto 450 MG CAPS Take 2 capsules by mouth daily.   . vitamin C (ASCORBIC ACID) 500 MG tablet Take 500 mg by mouth daily.  . [DISCONTINUED] clopidogrel (PLAVIX) 75 MG tablet TAKE 1 TABLET DAILY     Allergies:    Crestor [rosuvastatin], Lipitor [atorvastatin],  Penicillins, Sulfa antibiotics, Zetia [ezetimibe], and Zocor [simvastatin]   Social History: Social History   Socioeconomic History  . Marital status: Married    Spouse name: Not on file  . Number of children: 3  . Years of education: 16  . Highest education level: Bachelor's degree (e.g., BA, AB, BS)  Occupational History    Employer: [employer]  Tobacco Use  . Smoking status: Former Smoker    Packs/day: 3.00    Years: 30.00    Pack years: 90.00    Quit date: 08/03/1997    Years since quitting: 23.0  . Smokeless tobacco: Never Used  Vaping Use  . Vaping Use: Never used  Substance and Sexual Activity  . Alcohol use: Not Currently  . Drug use: Not Currently  . Sexual activity: Not Currently  Other Topics Concern  . Not on file  Social History Narrative  . Not on file   Social Determinants of Health   Financial Resource Strain: Not on file  Food Insecurity: Not on file  Transportation Needs: Not on file  Physical Activity: Not on file  Stress: Not on file  Social Connections: Not on file     Family History: The patient's family history includes Alcohol abuse in his brother; Cancer in his mother; Cirrhosis in his brother; Emphysema in his father.  ROS:   All other ROS reviewed and negative. Pertinent positives noted in the HPI.     EKGs/Labs/Other Studies Reviewed:   The following studies were personally reviewed by me today:  EKG:  EKG is ordered today.  The ekg ordered today demonstrates atrial fibrillation heart rate 79, no acute ischemic changes no evidence of infarction, and was personally reviewed by me.   TTE 09/19/2012 - Left ventricle: The cavity size was normal. There was mild  concentric hypertrophy. Left ventricular diastolic  function parameters were normal.  - Atrial septum: No defect or patent foramen ovale was  identified.    Recent Labs: 06/03/2020: ALT 19; BUN 19; Creatinine, Ser 1.12; Hemoglobin 15.7; Platelets 272; Potassium 4.2;  Sodium 142   Recent Lipid Panel    Component Value Date/Time   CHOL 198 06/03/2020 1612   TRIG 229 (H) 06/03/2020 1612   TRIG 87 08/31/2016 0909   HDL 26 (L) 06/03/2020 1612   HDL 31 (L) 08/31/2016 0909   CHOLHDL 7.6 (H) 06/03/2020 1612   CHOLHDL 5.9 12/25/2014 0921   VLDL 25 12/25/2014 0921   LDLCALC 131 (H) 06/03/2020 1612    Physical Exam:   VS:  BP 122/71   Pulse 91   Ht 6\' 5"  (1.956 m)   Wt 211 lb 12.8 oz (96.1 kg)   SpO2  95%   BMI 25.12 kg/m    Wt Readings from Last 3 Encounters:  08/02/20 211 lb 12.8 oz (96.1 kg)  06/16/20 209 lb (94.8 kg)  06/03/20 209 lb (94.8 kg)    General: Well nourished, well developed, in no acute distress Head: Atraumatic, normal size  Eyes: PEERLA, EOMI  Neck: Supple, no JVD Endocrine: No thryomegaly Cardiac: Normal S1, S2; irregular rhythm, no murmurs rubs or gallops Lungs: Clear to auscultation bilaterally, no wheezing, rhonchi or rales  Abd: Soft, nontender, no hepatomegaly  Ext: No edema, pulses 2+ Musculoskeletal: No deformities, BUE and BLE strength normal and equal Skin: Warm and dry, no rashes   Neuro: Alert and oriented to person, place, time, and situation, CNII-XII grossly intact, no focal deficits  Psych: Normal mood and affect   ASSESSMENT:   William Jefferson is a 75 y.o. male who presents for the following: 1. Persistent atrial fibrillation ([place])   2. Preop cardiovascular exam   3. Coronary artery disease involving native coronary artery of native heart without angina pectoris   4. Mixed hyperlipidemia   5. Essential hypertension     PLAN:   1. Persistent atrial fibrillation (HCC) -CHADSVASC= 3 (CAD, age, HTN) -New diagnosis for him.  No symptoms from this.  I recommended a TSH, CBC and BMP today.  We we will get an echocardiogram this week.  He has no symptoms of angina.  I see no need for a stress test.  He has several risk factors for A. fib and it is not surprising he is in it. -I recommend he start Eliquis 5  mg twice daily.  He is on Plavix.  We will switch him to aspirin.  This will have a safer bleeding profile. -He is currently rate controlled on metoprolol tartrate 25 twice a day. -Regarding his surgery we will wait for final assessment until he obtains an echocardiogram.  He has expressed that he would not want to delay his upcoming surgery for restoration of sinus rhythm as this would delay things.  I think this is reasonable.  As long as his echocardiogram is fine I think it is okay for him to proceed to surgery and then follow-up with Dr. Debara Pickett regarding rhythm control strategy if desired.  2. Preop cardiovascular exam -History of CAD.  Now in A. fib.  Can complete greater than 4 METS without any significant chest pain or shortness of breath.  I see no need for stress testing.  We are obtaining echocardiogram due to his new diagnosis of A. fib.  3. Coronary artery disease involving native coronary artery of native heart without angina pectoris -History of PCI.  Unable to be on several lipid-lowering agents.  Switch to aspirin as above.  4. Mixed hyperlipidemia -Management per Dr. Debara Pickett.  5. Essential hypertension -BP well controlled.  Disposition: Return if symptoms worsen or fail to improve.  Medication Adjustments/Labs and Tests Ordered: Current medicines are reviewed at length with the patient today.  Concerns regarding medicines are outlined above.  Orders Placed This Encounter  Procedures  . TSH  . CBC  . Basic metabolic panel  . EKG 12-Lead  . ECHOCARDIOGRAM COMPLETE   Meds ordered this encounter  Medications  . apixaban (ELIQUIS) 5 MG TABS tablet    Sig: Take 1 tablet (5 mg total) by mouth 2 (two) times daily.    Dispense:  60 tablet    Refill:  3  . aspirin EC 81 MG tablet    Sig: Take 1  tablet (81 mg total) by mouth daily. Swallow whole.    Dispense:  90 tablet    Refill:  3    Patient Instructions  Medication Instructions:  Stop Plavix Start Eliquis 5 mg twice  daily  Start Aspirin 81 mg (over the counter)  *If you need a refill on your cardiac medications before your next appointment, please call your pharmacy*  LAB: TSH, CBC, BMET today    Testing/Procedures: Echocardiogram (this week) ASAP - Your physician has requested that you have an echocardiogram. Echocardiography is a painless test that uses sound waves to create images of your heart. It provides your doctor with information about the size and shape of your heart and how well your heart's chambers and valves are working. This procedure takes approximately one hour. There are no restrictions for this procedure. This will be performed at our [facility] location - [address].  Follow-Up: At [facility] you and your health needs are our priority.  As part of our continuing mission to provide you with exceptional heart care, we have created designated Provider Care Teams.  These Care Teams include your primary Cardiologist (physician) and Advanced Practice Providers (APPs -  Physician Assistants and Nurse Practitioners) who all work together to provide you with the care you need, when you need it.  We recommend signing up for the patient portal called "MyChart".  Sign up information is provided on this After Visit Summary.  MyChart is used to connect with patients for Virtual Visits (Telemedicine).  Patients are able to view lab/test results, encounter notes, upcoming appointments, etc.  Non-urgent messages can be sent to your provider as well.   To learn more about what you can do with MyChart, go to [email or web address].    Your next appointment:   3 month(s)  The format for your next appointment:   In Person  Provider:   K. Mali Hilty, MD         Time Spent with Patient: I have spent a total of 35 minutes with patient reviewing hospital notes, telemetry, EKGs, labs and examining the patient as well as establishing an assessment and plan that was discussed with  the patient.  > 50% of time was spent in direct patient care.  Signed, Addison Naegeli. Audie Box, [place]  [address], [place], [place] [address] [phone]  08/02/2020 9:36 AM

## 2020-08-02 ENCOUNTER — Ambulatory Visit (INDEPENDENT_AMBULATORY_CARE_PROVIDER_SITE_OTHER): Payer: Medicare Other | Admitting: Cardiovascular Disease

## 2020-08-02 ENCOUNTER — Encounter: Payer: Self-pay | Admitting: Cardiovascular Disease

## 2020-08-02 ENCOUNTER — Ambulatory Visit (HOSPITAL_COMMUNITY)
Admission: RE | Admit: 2020-08-02 | Discharge: 2020-08-02 | Disposition: A | Discharge status: Home / Self Care | Payer: Medicare Other | Source: Ambulatory Visit | Attending: Cardiovascular Disease | Admitting: Cardiovascular Disease

## 2020-08-02 ENCOUNTER — Other Ambulatory Visit: Payer: Self-pay

## 2020-08-02 VITALS — BP 122/71 | HR 91 | Ht 77.0 in | Wt 211.8 lb

## 2020-08-02 DIAGNOSIS — I10 Essential (primary) hypertension: Secondary | ICD-10-CM

## 2020-08-02 DIAGNOSIS — Z01810 Encounter for preprocedural cardiovascular examination: Secondary | ICD-10-CM

## 2020-08-02 DIAGNOSIS — I4819 Other persistent atrial fibrillation: Secondary | ICD-10-CM

## 2020-08-02 DIAGNOSIS — I2510 Atherosclerotic heart disease of native coronary artery without angina pectoris: Secondary | ICD-10-CM

## 2020-08-02 DIAGNOSIS — E782 Mixed hyperlipidemia: Secondary | ICD-10-CM | POA: Diagnosis not present

## 2020-08-02 LAB — ECHOCARDIOGRAM COMPLETE
Area-P 1/2: 2.95 cm2
Height: 77 in
S' Lateral: 2.8 cm
Weight: 3388.8 oz

## 2020-08-02 MED ORDER — APIXABAN 5 MG PO TABS: 5.0000 mg | ORAL_TABLET | Freq: Two times a day (BID) | ORAL | 3 refills | Status: DC

## 2020-08-02 MED ORDER — ASPIRIN EC 81 MG PO TBEC: 81.0000 mg | DELAYED_RELEASE_TABLET | Freq: Every day | ORAL | 3 refills | Status: DC

## 2020-08-02 NOTE — Patient Instructions (Addendum)
Medication Instructions:  Stop Plavix Start Eliquis 5 mg twice daily  Start Aspirin 81 mg (over the counter)  *If you need a refill on your cardiac medications before your next appointment, please call your pharmacy*  LAB: TSH, CBC, BMET today    Testing/Procedures: Echocardiogram (this week) ASAP - Your physician has requested that you have an echocardiogram. Echocardiography is a painless test that uses sound waves to create images of your heart. It provides your doctor with information about the size and shape of your heart and how well your heart's chambers and valves are working. This procedure takes approximately one hour. There are no restrictions for this procedure. This will be performed at our [facility] location - [address].  Follow-Up: At [facility], you and your health needs are our priority.  As part of our continuing mission to provide you with exceptional heart care, we have created designated Provider Care Teams.  These Care Teams include your primary Cardiologist (physician) and Advanced Practice Providers (APPs -  Physician Assistants and Nurse Practitioners) who all work together to provide you with the care you need, when you need it.  We recommend signing up for the patient portal called "MyChart".  Sign up information is provided on this After Visit Summary.  MyChart is used to connect with patients for Virtual Visits (Telemedicine).  Patients are able to view lab/test results, encounter notes, upcoming appointments, etc.  Non-urgent messages can be sent to your provider as well.   To learn more about what you can do with MyChart, go to [email or web address].    Your next appointment:   3 month(s)  The format for your next appointment:   In Person  Provider:   K. Mali Hilty, MD

## 2020-08-02 NOTE — Progress Notes (Signed)
*  PRELIMINARY RESULTS* Echocardiogram 2D Echocardiogram has been performed.  William Jefferson 08/02/2020, 2:46 PM

## 2020-08-03 LAB — BASIC METABOLIC PANEL
BUN/Creatinine Ratio: 13 (ref 10–24)
BUN: 16 mg/dL (ref 8–27)
CO2: 22 mmol/L (ref 20–29)
Calcium: 9.8 mg/dL (ref 8.6–10.2)
Chloride: 102 mmol/L (ref 96–106)
Creatinine, Ser: 1.24 mg/dL (ref 0.76–1.27)
GFR calc Af Amer: 66 mL/min/{1.73_m2} (ref >59)
GFR calc non Af Amer: 57 mL/min/{1.73_m2} — ABNORMAL LOW (ref >59)
Glucose: 96 mg/dL (ref 65–99)
Potassium: 4.6 mmol/L (ref 3.5–5.2)
Sodium: 142 mmol/L (ref 134–144)

## 2020-08-03 LAB — TSH: TSH: 0.891 u[IU]/mL (ref 0.450–4.500)

## 2020-08-03 LAB — CBC
Hematocrit: 48.7 % (ref 37.5–51.0)
Hemoglobin: 16.4 g/dL (ref 13.0–17.7)
MCH: 31.6 pg (ref 26.6–33.0)
MCHC: 33.7 g/dL (ref 31.5–35.7)
MCV: 94 fL (ref 79–97)
Platelets: 271 10*3/uL (ref 150–450)
RBC: 5.19 x10E6/uL (ref 4.14–5.80)
RDW: 12.9 % (ref 11.6–15.4)
WBC: 8.4 10*3/uL (ref 3.4–10.8)

## 2020-08-26 DIAGNOSIS — L570 Actinic keratosis: Secondary | ICD-10-CM | POA: Diagnosis not present

## 2020-08-26 DIAGNOSIS — Z85820 Personal history of malignant melanoma of skin: Secondary | ICD-10-CM | POA: Diagnosis not present

## 2020-08-26 DIAGNOSIS — Z1283 Encounter for screening for malignant neoplasm of skin: Secondary | ICD-10-CM | POA: Diagnosis not present

## 2020-08-26 DIAGNOSIS — X32XXXD Exposure to sunlight, subsequent encounter: Secondary | ICD-10-CM | POA: Diagnosis not present

## 2020-08-26 DIAGNOSIS — Z08 Encounter for follow-up examination after completed treatment for malignant neoplasm: Secondary | ICD-10-CM | POA: Diagnosis not present

## 2020-08-26 DIAGNOSIS — D225 Melanocytic nevi of trunk: Secondary | ICD-10-CM | POA: Diagnosis not present

## 2020-09-14 ENCOUNTER — Telehealth: Payer: Self-pay

## 2020-09-14 NOTE — Telephone Encounter (Signed)
   [facility] Pre-operative Risk Assessment     Request for surgical clearance:  1. What type of surgery is being performed? Right Knee Scope    2. When is this surgery scheduled? TBD   3. What type of clearance is required (medical clearance vs. Pharmacy clearance to hold med vs. Both)? BOTH  4. Are there any medications that need to be held prior to surgery and how long? Eliquis  5. Practice name and name of physician performing surgery? [facility], Dr. Netta Cedars   6. What is the office phone number? [phone]   7.   What is the office fax number? [phone] Attn: Adline Potter   8.   Anesthesia type (None, local, MAC, general) ?

## 2020-09-14 NOTE — Telephone Encounter (Signed)
Pharmacy please comment on anticoagulation and then we will contact the patient for preop clearance.  Kerin Ransom PA-C 09/14/2020 1:13 PM

## 2020-09-14 NOTE — Telephone Encounter (Signed)
   Primary Cardiologist: Pixie Casino, MD  Chart reviewed as part of pre-operative protocol coverage. Given past medical history and time since last visit, based on ACC/AHA guidelines, DEVINE KLINGEL would be at acceptable risk for the planned procedure without further cardiovascular testing.   Ok to hold Eliquis 1-2 days pre op if needed.  The patient was advised that if he develops new symptoms prior to surgery to contact our office to arrange for a follow-up visit, and he verbalized understanding.  I will route this recommendation to the requesting party via Epic fax function and remove from pre-op pool.  Please call with questions.  Kerin Ransom, PA-C 09/14/2020, 2:58 PM

## 2020-09-14 NOTE — Telephone Encounter (Addendum)
Patient with diagnosis of atrial fibrillation on Eliquis for anticoagulation.    Procedure: Right Knee Scope Date of procedure: TBD  CHA2DS2-VASc Score = 3  This indicates a 3.2% annual risk of stroke. The patient's score is based upon: CHF History: No HTN History: Yes Diabetes History: No Stroke History: No Vascular Disease History: Yes Age Score: 1 Gender Score: 0   CrCl 71 ml/min Platelet count 272K  Per office protocol, patient can hold Eliquis for 1-2 days prior to procedure.

## 2020-09-15 ENCOUNTER — Ambulatory Visit: Payer: Federal, State, Local not specified - PPO | Admitting: Nurse Practitioner

## 2020-09-17 DIAGNOSIS — S0181XA Laceration without foreign body of other part of head, initial encounter: Secondary | ICD-10-CM | POA: Diagnosis not present

## 2020-09-17 DIAGNOSIS — Z23 Encounter for immunization: Secondary | ICD-10-CM | POA: Diagnosis not present

## 2020-09-17 DIAGNOSIS — Z6824 Body mass index (BMI) 24.0-24.9, adult: Secondary | ICD-10-CM | POA: Diagnosis not present

## 2020-10-07 ENCOUNTER — Other Ambulatory Visit: Payer: Self-pay

## 2020-10-07 DIAGNOSIS — Z006 Encounter for examination for normal comparison and control in clinical research program: Secondary | ICD-10-CM

## 2020-10-07 NOTE — Research (Signed)
Subject came in for [place] visit. No AE/SAE to report. Concomitant medications reviewed. IP dispensed. EOS visit scheduled for 12/15/2020.

## 2020-10-11 ENCOUNTER — Other Ambulatory Visit: Payer: Self-pay

## 2020-10-11 DIAGNOSIS — I10 Essential (primary) hypertension: Secondary | ICD-10-CM

## 2020-10-11 MED ORDER — LISINOPRIL 10 MG PO TABS: 10.0000 mg | ORAL_TABLET | Freq: Every day | ORAL | 0 refills | Status: DC

## 2020-10-13 ENCOUNTER — Ambulatory Visit: Payer: Federal, State, Local not specified - PPO | Admitting: Nurse Practitioner

## 2020-11-02 ENCOUNTER — Ambulatory Visit: Payer: Medicare Other | Admitting: Internal Medicine

## 2020-11-08 ENCOUNTER — Encounter: Payer: Self-pay | Admitting: Internal Medicine

## 2020-11-08 ENCOUNTER — Other Ambulatory Visit: Payer: Self-pay

## 2020-11-08 ENCOUNTER — Ambulatory Visit (INDEPENDENT_AMBULATORY_CARE_PROVIDER_SITE_OTHER): Payer: PPO | Admitting: Internal Medicine

## 2020-11-08 VITALS — BP 112/72 | HR 83 | Ht 77.0 in | Wt 216.8 lb

## 2020-11-08 DIAGNOSIS — I10 Essential (primary) hypertension: Secondary | ICD-10-CM | POA: Diagnosis not present

## 2020-11-08 DIAGNOSIS — Z01818 Encounter for other preprocedural examination: Secondary | ICD-10-CM | POA: Diagnosis not present

## 2020-11-08 DIAGNOSIS — Z01810 Encounter for preprocedural cardiovascular examination: Secondary | ICD-10-CM

## 2020-11-08 DIAGNOSIS — E782 Mixed hyperlipidemia: Secondary | ICD-10-CM

## 2020-11-08 DIAGNOSIS — I4819 Other persistent atrial fibrillation: Secondary | ICD-10-CM | POA: Diagnosis not present

## 2020-11-08 DIAGNOSIS — I2510 Atherosclerotic heart disease of native coronary artery without angina pectoris: Secondary | ICD-10-CM | POA: Diagnosis not present

## 2020-11-08 NOTE — Patient Instructions (Addendum)
Medication Instructions:  Your physician recommends that you continue on your current medications as directed. Please refer to the Current Medication list given to you today.  DO NOT MISS ANY ELIQUIS DOSES  *If you need a refill on your cardiac medications before your next appointment, please call your pharmacy*   Lab Work: BMET & CBC to be done about 1 week before your cardioversion   If you have labs (blood work) drawn today and your tests are completely normal, you will receive your results only by: Marland Kitchen MyChart Message (if you have MyChart) OR . A paper copy in the mail If you have any lab test that is abnormal or we need to change your treatment, we will call you to review the results.   Testing/Procedures: Cardioversion @ [facility]   Follow-Up: At [facility], you and your health needs are our priority.  As part of our continuing mission to provide you with exceptional heart care, we have created designated Provider Care Teams.  These Care Teams include your primary Cardiologist (physician) and Advanced Practice Providers (APPs -  Physician Assistants and Nurse Practitioners) who all work together to provide you with the care you need, when you need it.  We recommend signing up for the patient portal called "MyChart".  Sign up information is provided on this After Visit Summary.  MyChart is used to connect with patients for Virtual Visits (Telemedicine).  Patients are able to view lab/test results, encounter notes, upcoming appointments, etc.  Non-urgent messages can be sent to your provider as well.   To learn more about what you can do with MyChart, go to [email or web address].    Your next appointment:   Mid-July - post-cardioversion  The format for your next appointment:   In Person  Provider:   You may see Pixie Casino, MD or one of the following Advanced Practice Providers on your designated Care Team:    Almyra Deforest, PA-C  Fabian Sharp, [place] or   Roby Lofts, [place]    Other Instructions  You are scheduled for a Cardioversion on December 17, 2020 with Dr. Debara Pickett.  Please arrive at the [facility] (Main Entrance A) at [facility]: [place], [place] [address] at [place] at [place]: Nothing to eat or drink after midnight except a sip of water with medications (see medication instructions below)  Medication Instructions: Hold N/A  Continue your anticoagulant: Eliquis You will need to continue your anticoagulant after your procedure until  you are told by your provider that it is safe to stop   Labs: BMET & CBC due 1 week before cardioversion   Due to COVID-19 restrictions implemented by our local and state authorities and in an effort to keep both patients and staff as safe as possible, our hospital system requires COVID-19 testing prior to certain scheduled hospital procedures.  Please go to [facility]. [place], [place] [address] on _______ at  __________  This is a drive up testing site - please tell then you are there for pre-procedure testing.  You will not need to exit your vehicle.  You will not be billed at the time of testing but may receive a bill later depending on your insurance. You must agree to self-quarantine from the time of your testing until the procedure date on 12/17/20.  This should included staying home with ONLY the people you live with.  Avoid eating out, grocery store shopping or leaving the house for any  non-emergent reason, for example.  Failure to have your COVID-19 test done on the date and time you have been scheduled will result in cancellation of your procedure.  Please call our office at [phone] if you have any questions.      You must have a responsible person to drive you home and stay in the waiting area during your procedure. Failure to do so could result in cancellation.  Bring your insurance cards.  *Special Note: Every effort is made to have your procedure done on  time. Occasionally there are emergencies that occur at the hospital that may cause delays. Please be patient if a delay does occur.

## 2020-11-08 NOTE — Progress Notes (Signed)
OFFICE NOTE  Chief Complaint:  Routine follow-up  Primary Care Physician: William Lynn, NP  HPI:  William Jefferson is a pleasant 75 year old male formerly followed by Dr. Rollene Fare. He is here today to establish new cardiac care. His past medical history is significant for coronary artery disease. In 1999 he was having symptoms of angina and presented to her primary care physician who performed an EKG and sent him to the emergency room. There he will have angiography and was found to have a ostial right coronary artery lesion of a very large vessel. He ultimately had placement of an iliac stent, as no coronary stents of that size were available at the time. This is actually stayed very patent for long period of time. In 2009 he had another episode of chest pain and was found to have circumflex and diagonal lesions which were stented with drug-eluting stents. The OM1 was stented with a 2 5 x 10 mm Promus stent that was postdilated to 3.0 mm.  The diagonal was a 2 5 x 18 mm Promus stent that there was also postdilated to 2.75 mm. He's done well since then without any chest pain complaints. 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 He denies any chest pain or shortness of breath. Unfortunately been intolerant to all statins and Zetia. He is unwilling to take any more of those medicines as is found in its cause leg pain, weakness and difficulty performing his job. He is on monotherapy Plavix, but not aspirin, but denies any history of allergy aspirin. His only other issue is a rising PSA. He said a number of prostate biopsies and actually developed an infection at one time. He's had atypical cells but no determinant prostate cancer.  I saw William Jefferson back today. He continues to be without complaints. 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 He does have a history of being intolerant to at  least 5 or 6 different statins. He's not as cholesterol checked in about 4 years. We discussed the new medications that are now out to treat cholesterol a said that he be willing to try at least a low-dose statin is to be on a qualifying dose of max tolerated therapy. We would either have to recheck his cholesterol level.  William Jefferson returns today for follow-up. Overall he is doing well and is asymptomatic. He is started taking pravastatin 1-2 times a week which is certainly better than nothing. He's been intolerant of all other statins including Zetia. He's managed to lose a couple pounds but generally hovers around 215. Blood pressure is well-controlled.  08/21/2016  William Jefferson is seen today in follow-up. Since I last saw him he had tried to take pravastatin as he's been intolerant to Crestor 20 mg, Lipitor 40 mg, Zetia 10 mg and Zocor 40 mg doses in the past due to myalgias. He was placed on pravastatin 40 mg with additional symptoms and decreased it to 3 times weekly and eventually stopped the medication. He continues to have elevated cholesterol with LDL-C greater than 150. He has known coronary artery disease with prior PCI in 1999 and again in 2009. We discussed today additional treatment options including PC SK 9 inhibitors and potentially enrolling him in the Orion-10 trial.  08/03/2017  William Jefferson was seen today in follow-up.  Overall he seems to be doing well.  Unfortunately he has been intolerant to number statins.  He declined the [employer] 10 trial.  We discussed the PCS K9 inhibitors today however is concerned about the injectables.  He asked about what pill options may be available.  I mentioned that he may be a candidate for the CLEAR trial.  He said he may be interested in this.  08/08/2018  William Jefferson is seen today in follow-up.  He is again without complaints.  He continues to work Event organiser.  He has been involved in a research trial with bempedoic acid (clear trial).  Is not clear  whether he is on placebo or treatment.  So far he seems to be tolerating things.  That study may conclude earlier this year as there is likely going to be release of the medication to market.  Otherwise he is without complaints.  No chest pain no shortness of breath.  09/12/2019  William Jefferson is seen today for follow-up.  He continues to do well.  Nuys any chest pain or worsening shortness of breath.  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  He still enrolled in the clear trial.  I will reach out to the study coordinators to see whether or not he has crossed over to treatment or is enrolled in the clear outcomes trial.  He was made aware that the trial compound bempedoic acid is commercially available as Nexletol.  11/08/2020  William Jefferson returns today for follow-up.  He was seen in January by my partner Dr. Audie Box for an acute add-on for preoperative visit.  He was found to be in rate controlled A. fib at the time.  He was appropriately switched to Eliquis and an echocardiogram was performed.  This showed normal systolic function with no evidence of atrial enlargement.  He was supposed to have a planned right knee surgery ultimately that was not performed.  He returns today for follow-up.  His EKG shows he is in persistent A. fib with frequent PVCs in a quadrigeminal pattern.  He reports he has missed several doses of Eliquis over the past several months.  We discussed options including continuing his rate control or a possibility of an elective cardioversion.  Although he says he denies any chest pain or fatigue, it was unclear to him how long he may have been in A. fib.  PMHx:  Past Medical History:  Diagnosis Date  . Bladder stone    removed in 2014 by Dr. Jeffie Pollock  . Cataract   . Coronary artery disease   . Hyperlipidemia    statin intolerance  . Hypertension     Past Surgical History:  Procedure Laterality Date  . CARDIAC CATHETERIZATION  06/10/1998    large P150 iliac stent to RCA - 3.5x54mm NIR stent (Dr. Marella Chimes)  . CARDIAC CATHETERIZATION  07/16/2007   mid-distal LAD stenosis, normal LV function, 2.5x42mm Promus stent to obtuse marginal, 2.5x41mm Promus stent to osital & proximal diagonal (Dr. Marella Chimes, Dr. Domenic Moras)  . MELANOMA EXCISION  2018  . NM MYOCAR PERF WALL MOTION  08/2008   bruce myoview; normal pattern of perfusion, EF 77%  . PROSTATE BIOPSY  2002  . TRANSTHORACIC ECHOCARDIOGRAM  08/2012   mild conc LVH    FAMHx:  Family History  Problem Relation Age of Onset  . Cancer Mother        breast  . Emphysema Father   . Cirrhosis Brother   . Alcohol abuse Brother  SOCHx:   reports that he quit smoking about 23 years ago. He has a 90.00 pack-year smoking history. He has never used smokeless tobacco. He reports previous alcohol use. He reports previous drug use.  ALLERGIES:  Allergies  Allergen Reactions  . Crestor [Rosuvastatin]   . Lipitor [Atorvastatin]   . Penicillins   . Sulfa Antibiotics   . Zetia [Ezetimibe]   . Zocor [Simvastatin]     ROS: Pertinent items noted in HPI and remainder of comprehensive ROS otherwise negative.  HOME MEDS: Current Outpatient Medications  Medication Sig Dispense Refill  . AMBULATORY NON FORMULARY MEDICATION Take 180 mg by mouth daily. Medication Name:bempedoic acid vs a placebo, CLEAR Research Study drug provided    . apixaban (ELIQUIS) 5 MG TABS tablet Take 1 tablet (5 mg total) by mouth 2 (two) times daily. 60 tablet 3  . aspirin EC 81 MG tablet Take 1 tablet (81 mg total) by mouth daily. Swallow whole. 90 tablet 3  . cephALEXin (KEFLEX) 500 MG capsule Take by mouth.    Marland Kitchen lisinopril (ZESTRIL) 10 MG tablet Take 1 tablet (10 mg total) by mouth daily. 90 tablet 0  . Lutein 20 MG CAPS Take 1 capsule by mouth daily.    . metoprolol tartrate (LOPRESSOR) 25 MG tablet Take 1 tablet (25 mg total) by mouth 2 (two) times daily. 90 tablet 1  . Saw Palmetto 450 MG CAPS Take 2  capsules by mouth daily.     . vitamin C (ASCORBIC ACID) 500 MG tablet Take 500 mg by mouth daily.    . nitroGLYCERIN (NITROSTAT) 0.4 MG SL tablet Place 1 tablet (0.4 mg total) under the tongue every 5 (five) minutes as needed for chest pain. (Patient not taking: Reported on 06/16/2020) 25 tablet 3   No current facility-administered medications for this visit.    LABS/IMAGING: No results found for this or any previous visit (from the past 48 hour(s)). No results found.  VITALS: BP 112/72   Pulse 83   Ht 6\' 5"  (1.956 m)   Wt 216 lb 12.8 oz (98.3 kg)   BMI 25.71 kg/m   EXAM: General appearance: alert and no distress Neck: no carotid bruit and no JVD Lungs: clear to auscultation bilaterally Heart: regular rate and rhythm, S1, S2 normal, no murmur, click, rub or gallop Abdomen: soft, non-tender; bowel sounds normal; no masses,  no organomegaly Extremities: extremities normal, atraumatic, no cyanosis or edema Pulses: 2+ and symmetric Skin: Skin color, texture, turgor normal. No rashes or lesions Neurologic: Grossly normal Psych: Mood, affect normal  EKG: A. fib with PVCs at 83 -personally reviewed  ASSESSMENT: 1. Coronary artery disease status post PCI to the ostial RCA in 1999, proximal diagonal and circumflex marginal in 2009 2. Persistent atrial fibrillation on Eliquis 3. Dyslipidemia-intolerant to statins and Zetia 4. Hypertension-controlled 5. Elevated PSA - due to prostatitis 6. CLINICAL TRIAL (CLEAR, bempedoic acid)  PLAN: 1.   William Jefferson is in persistent atrial fibrillation on Eliquis.  He has missed some doses recently.  We discussed the possibility of elective cardioversion.  I think it is worthwhile to consider least 1 trial with this as he may be symptomatic and unaware of it.  He will need at least 3 weeks of uninterrupted anticoagulation.  We will try to schedule this in mid June.  Subsequently he cannot stop anticoagulation at least a month after the procedure  and he is aware of this with regards to possibly scheduling knee surgery.  This would likely  be well into August or early September.  Follow-up with me after the procedure.  Pixie Casino, MD, [facility], [place] Director of the [facility] Lipid Disorders &  Cardi[facility] Risk [facility] Diplomate of the American Board of Clinical Lipidology Attending Cardiologist  Direct Dial: [phone]  Fax: [phone]  Website:  [email or web address] 11/08/2020, 10:06 AM

## 2020-11-10 ENCOUNTER — Ambulatory Visit (INDEPENDENT_AMBULATORY_CARE_PROVIDER_SITE_OTHER): Payer: PPO | Admitting: Nurse Practitioner

## 2020-11-10 ENCOUNTER — Encounter: Payer: Self-pay | Admitting: Nurse Practitioner

## 2020-11-10 ENCOUNTER — Other Ambulatory Visit: Payer: Self-pay

## 2020-11-10 VITALS — BP 119/73 | HR 86 | Temp 97.3°F | Ht 77.0 in | Wt 211.0 lb

## 2020-11-10 DIAGNOSIS — L0291 Cutaneous abscess, unspecified: Secondary | ICD-10-CM

## 2020-11-10 DIAGNOSIS — I10 Essential (primary) hypertension: Secondary | ICD-10-CM

## 2020-11-10 MED ORDER — DOXYCYCLINE HYCLATE 100 MG PO TABS: 100.0000 mg | ORAL_TABLET | Freq: Two times a day (BID) | ORAL | 0 refills | Status: DC

## 2020-11-10 NOTE — Assessment & Plan Note (Signed)
Abscess cleaned, drained and packed.  Patient follow-up in 3 days. Antibiotic doxycycline 100 mg tablet by mouth ordered.  Rx sent to pharmacy.

## 2020-11-10 NOTE — Patient Instructions (Signed)
Skin Abscess  A skin abscess is an infected area of your skin that contains pus and other material. An abscess can happen in any part of your body. Some abscesses break open (rupture) on their own. Most continue to get worse unless they are treated. The infection can spread deeper into the body and into your blood, which can make you feel sick. A skin abscess is caused by germs that enter the skin through a cut or scrape. It can also be caused by blocked oil and sweat glands or infected hair follicles. This condition is usually treated by:  Draining the pus.  Taking antibiotic medicines.  Placing a warm, wet washcloth over the abscess. Follow these instructions at home: Medicines  Take over-the-counter and prescription medicines only as told by your doctor.  If you were prescribed an antibiotic medicine, take it as told by your doctor. Do not stop taking the antibiotic even if you start to feel better.   Abscess care  If you have an abscess that has not drained, place a warm, clean, wet washcloth over the abscess several times a day. Do this as told by your doctor.  Follow instructions from your doctor about how to take care of your abscess. Make sure you: ? Cover the abscess with a bandage (dressing). ? Change your bandage or gauze as told by your doctor. ? Wash your hands with soap and water before you change the bandage or gauze. If you cannot use soap and water, use hand sanitizer.  Check your abscess every day for signs that the infection is getting worse. Check for: ? More redness, swelling, or pain. ? More fluid or blood. ? Warmth. ? More pus or a bad smell.   General instructions  To avoid spreading the infection: ? Do not share personal care items, towels, or hot tubs with others. ? Avoid making skin-to-skin contact with other people.  Keep all follow-up visits as told by your doctor. This is important. Contact a doctor if:  You have more redness, swelling, or pain  around your abscess.  You have more fluid or blood coming from your abscess.  Your abscess feels warm when you touch it.  You have more pus or a bad smell coming from your abscess.  You have a fever.  Your muscles ache.  You have chills.  You feel sick. Get help right away if:  You have very bad (severe) pain.  You see red streaks on your skin spreading away from the abscess. Summary  A skin abscess is an infected area of your skin that contains pus and other material.  The abscess is caused by germs that enter the skin through a cut or scrape. It can also be caused by blocked oil and sweat glands or infected hair follicles.  Follow your doctor's instructions on caring for your abscess, taking medicines, preventing infections, and keeping follow-up visits. This information is not intended to replace advice given to you by your health care provider. Make sure you discuss any questions you have with your health care provider. Document Revised: 01/23/2019 Document Reviewed: 08/02/2017 Elsevier Patient Education  2021 [employer].

## 2020-11-10 NOTE — Assessment & Plan Note (Signed)
Essential hypertension well managed on current medication dose no changes.  Continue to keep monitoring blood pressure log, low-sodium diet and, exercise.  Education provided to patient with printed handouts given.

## 2020-11-10 NOTE — Progress Notes (Signed)
Acute Office Visit  Subjective:    Patient ID: William Jefferson, male    DOB: Aug 22, 1945, 75 y.o.   MRN: [id number]  Chief Complaint  Patient presents with  . Medical Management of Chronic Issues  . Abscess    Abscess This is a chronic problem. The current episode started more than 1 month ago. The problem occurs constantly. The problem has been gradually worsening. Pertinent negatives include no diaphoresis, fever or swollen glands. Treatments tried: Oral antibiotic. The treatment provided no relief.  I & D  Date/Time: 11/10/2020 9:20 PM Performed by: [name], NP Authorized by: [name], NP   Consent:    Consent obtained:  Verbal   Consent given by:  Patient   Risks discussed:  Incomplete drainage and infection   Alternatives discussed:  Alternative treatment Location:    Type:  Abscess   Location: back. Pre-procedure details:    Skin preparation:  Betadine (Abscess already open) Sedation:    Sedation type: none. Anesthesia (see MAR for exact dosages):    Anesthesia method:  None Procedure type:    Complexity:  Simple Procedure details:    Needle aspiration: no     Incision type: abscess already open.   Wound management:  Extensive cleaning   Drainage:  Purulent and serosanguinous   Drainage amount:  Copious   Wound treatment: packing.   Packing materials:  1/4 in iodoform gauze Post-procedure details:    Patient tolerance of procedure:  Tolerated well, no immediate complications  patient presents for follow up of hypertension. Patient was diagnosed in 08/14/2013. The patient is tolerating the medication well without side effects. Compliance with treatment has been good; including taking medication as directed , maintains a healthy diet and regular exercise regimen , and following up as directed.  Current medication lisinopril 10 mg tablet by mouth daily, metoprolol 25 mg tablet by mouth twice daily.  Past Medical History:  Diagnosis Date  .  Bladder stone    removed in 2014 by Dr. Jeffie Pollock  . Cataract   . Coronary artery disease   . Hyperlipidemia    statin intolerance  . Hypertension     Past Surgical History:  Procedure Laterality Date  . CARDIAC CATHETERIZATION  06/10/1998   large P150 iliac stent to RCA - 3.5x56mm NIR stent (Dr. Marella Chimes)  . CARDIAC CATHETERIZATION  07/16/2007   mid-distal LAD stenosis, normal LV function, 2.5x33mm Promus stent to obtuse marginal, 2.5x37mm Promus stent to osital & proximal diagonal (Dr. Marella Chimes, Dr. Domenic Moras)  . MELANOMA EXCISION  2018  . NM MYOCAR PERF WALL MOTION  08/2008   bruce myoview; normal pattern of perfusion, EF 77%  . PROSTATE BIOPSY  2002  . TRANSTHORACIC ECHOCARDIOGRAM  08/2012   mild conc LVH    Family History  Problem Relation Age of Onset  . Cancer Mother        breast  . Emphysema Father   . Cirrhosis Brother   . Alcohol abuse Brother     Social History   Socioeconomic History  . Marital status: Married    Spouse name: Not on file  . Number of children: 3  . Years of education: 16  . Highest education level: Bachelor's degree (e.g., BA, AB, BS)  Occupational History    Employer: [employer]  Tobacco Use  . Smoking status: Former Smoker    Packs/day: 3.00    Years: 30.00    Pack years: 90.00    Quit  date: 08/03/1997    Years since quitting: 23.2  . Smokeless tobacco: Never Used  Vaping Use  . Vaping Use: Never used  Substance and Sexual Activity  . Alcohol use: Not Currently  . Drug use: Not Currently  . Sexual activity: Not Currently  Other Topics Concern  . Not on file  Social History Narrative  . Not on file   Social Determinants of Health   Financial Resource Strain: Not on file  Food Insecurity: Not on file  Transportation Needs: Not on file  Physical Activity: Not on file  Stress: Not on file  Social Connections: Not on file  Intimate Partner Violence: Not on file    Outpatient Medications Prior to Visit  Medication  Sig Dispense Refill  . AMBULATORY NON FORMULARY MEDICATION Take 180 mg by mouth daily. Medication Name:bempedoic acid vs a placebo, CLEAR Research Study drug provided    . apixaban (ELIQUIS) 5 MG TABS tablet Take 1 tablet (5 mg total) by mouth 2 (two) times daily. 60 tablet 3  . aspirin EC 81 MG tablet Take 1 tablet (81 mg total) by mouth daily. Swallow whole. 90 tablet 3  . cephALEXin (KEFLEX) 500 MG capsule Take by mouth.    Marland Kitchen lisinopril (ZESTRIL) 10 MG tablet Take 1 tablet (10 mg total) by mouth daily. 90 tablet 0  . Lutein 20 MG CAPS Take 1 capsule by mouth daily.    . metoprolol tartrate (LOPRESSOR) 25 MG tablet Take 1 tablet (25 mg total) by mouth 2 (two) times daily. 90 tablet 1  . Saw Palmetto 450 MG CAPS Take 2 capsules by mouth daily.     . vitamin C (ASCORBIC ACID) 500 MG tablet Take 500 mg by mouth daily.    . nitroGLYCERIN (NITROSTAT) 0.4 MG SL tablet Place 1 tablet (0.4 mg total) under the tongue every 5 (five) minutes as needed for chest pain. (Patient not taking: Reported on 06/16/2020) 25 tablet 3   No facility-administered medications prior to visit.    Allergies  Allergen Reactions  . Crestor [Rosuvastatin]   . Lipitor [Atorvastatin]   . Penicillins   . Sulfa Antibiotics   . Zetia [Ezetimibe]   . Zocor [Simvastatin]     Review of Systems  Constitutional: Negative for diaphoresis and fever.  Respiratory: Negative.   Skin: Positive for color change and wound.  All other systems reviewed and are negative.      Objective:    Physical Exam Vitals and nursing note reviewed.  Constitutional:      Appearance: Normal appearance.  HENT:     Head: Normocephalic.     Nose: Nose normal.  Eyes:     Conjunctiva/sclera: Conjunctivae normal.  Cardiovascular:     Rate and Rhythm: Normal rate and regular rhythm.     Pulses: Normal pulses.     Heart sounds: Normal heart sounds.  Pulmonary:     Effort: Pulmonary effort is normal.     Breath sounds: Normal breath  sounds.  Abdominal:     General: Bowel sounds are normal.  Skin:    General: Skin is warm.     Findings: Abscess and erythema present.          Comments: Necrotic open abscess field with serosanguineous and purulent fatty tissue left mid back.  Neurological:     Mental Status: He is alert and oriented to person, place, and time.  Psychiatric:        Behavior: Behavior normal.     BP 119/73  Pulse 86   Temp (!) 97.3 F (36.3 C) (Temporal)   Ht 6\' 5"  (1.956 m)   Wt 211 lb (95.7 kg)   SpO2 99%   BMI 25.02 kg/m  Wt Readings from Last 3 Encounters:  11/10/20 211 lb (95.7 kg)  11/08/20 216 lb 12.8 oz (98.3 kg)  08/02/20 211 lb 12.8 oz (96.1 kg)    Health Maintenance Due  Topic Date Due  . Hepatitis C Screening  Never done  . COLONOSCOPY (Pts 45-21yrs Insurance coverage will need to be confirmed)  Never done    There are no preventive care reminders to display for this patient.   Lab Results  Component Value Date   TSH 0.891 08/02/2020   Lab Results  Component Value Date   WBC 8.4 08/02/2020   HGB 16.4 08/02/2020   HCT 48.7 08/02/2020   MCV 94 08/02/2020   PLT 271 08/02/2020   Lab Results  Component Value Date   NA 142 08/02/2020   K 4.6 08/02/2020   CO2 22 08/02/2020   GLUCOSE 96 08/02/2020   BUN 16 08/02/2020   CREATININE 1.24 08/02/2020   BILITOT 0.4 06/03/2020   ALKPHOS 72 06/03/2020   AST 13 06/03/2020   ALT 19 06/03/2020   PROT 7.1 06/03/2020   ALBUMIN 4.5 06/03/2020   CALCIUM 9.8 08/02/2020   Lab Results  Component Value Date   CHOL 198 06/03/2020   Lab Results  Component Value Date   HDL 26 (L) 06/03/2020   Lab Results  Component Value Date   LDLCALC 131 (H) 06/03/2020   Lab Results  Component Value Date   TRIG 229 (H) 06/03/2020   Lab Results  Component Value Date   CHOLHDL 7.6 (H) 06/03/2020   No results found for: HGBA1C     Assessment & Plan:   Problem List Items Addressed This Visit      Cardiovascular and  Mediastinum   Essential hypertension - Primary    Essential hypertension well managed on current medication dose no changes.  Continue to keep monitoring blood pressure log, low-sodium diet and, exercise.  Education provided to patient with printed handouts given.      Relevant Orders   CBC with Differential   Comprehensive metabolic panel   Lipid Panel     Other   Abscess    Abscess cleaned, drained and packed.  Patient follow-up in 3 days. Antibiotic doxycycline 100 mg tablet by mouth ordered.  Rx sent to pharmacy.      Relevant Medications   doxycycline (VIBRA-TABS) 100 MG tablet   Other Relevant Orders   Anaerobic and Aerobic Culture       Meds ordered this encounter  Medications  . doxycycline (VIBRA-TABS) 100 MG tablet    Sig: Take 1 tablet (100 mg total) by mouth 2 (two) times daily.    Dispense:  14 tablet    Refill:  0    Order Specific Question:   Supervising Provider    Answer:   Janora Norlander [[id number]]     [name], NP

## 2020-11-12 ENCOUNTER — Encounter: Payer: Self-pay | Admitting: Family Medicine

## 2020-11-12 ENCOUNTER — Ambulatory Visit (INDEPENDENT_AMBULATORY_CARE_PROVIDER_SITE_OTHER): Payer: PPO | Admitting: Family Medicine

## 2020-11-12 ENCOUNTER — Other Ambulatory Visit: Payer: Self-pay

## 2020-11-12 VITALS — BP 131/90 | HR 84 | Temp 97.2°F | Ht 77.0 in | Wt 212.4 lb

## 2020-11-12 DIAGNOSIS — Z5189 Encounter for other specified aftercare: Secondary | ICD-10-CM

## 2020-11-12 DIAGNOSIS — L0291 Cutaneous abscess, unspecified: Secondary | ICD-10-CM

## 2020-11-12 NOTE — Progress Notes (Signed)
Subjective: CC: recheck wound PCP: [name], NP William Jefferson is a 75 y.o. male presenting to clinic today for:  1. Wound Patient had I& D performed by PCP a few days ago.  He is here for wound reassessment and packing.  He has not changed dressing since last visit.  Denies any significant saturation through dressing.  No fevers, chills.  Compliant with antibiotic.   ROS: Per HPI  Allergies  Allergen Reactions  . Crestor [Rosuvastatin]   . Lipitor [Atorvastatin]   . Penicillins   . Sulfa Antibiotics   . Zetia [Ezetimibe]   . Zocor [Simvastatin]    Past Medical History:  Diagnosis Date  . Bladder stone    removed in 2014 by Dr. Jeffie Pollock  . Cataract   . Coronary artery disease   . Hyperlipidemia    statin intolerance  . Hypertension     Current Outpatient Medications:  .  AMBULATORY NON FORMULARY MEDICATION, Take 180 mg by mouth daily. Medication Name:bempedoic acid vs a placebo, CLEAR Research Study drug provided, Disp: , Rfl:  .  apixaban (ELIQUIS) 5 MG TABS tablet, Take 1 tablet (5 mg total) by mouth 2 (two) times daily., Disp: 60 tablet, Rfl: 3 .  aspirin EC 81 MG tablet, Take 1 tablet (81 mg total) by mouth daily. Swallow whole., Disp: 90 tablet, Rfl: 3 .  cephALEXin (KEFLEX) 500 MG capsule, Take by mouth., Disp: , Rfl:  .  doxycycline (VIBRA-TABS) 100 MG tablet, Take 1 tablet (100 mg total) by mouth 2 (two) times daily., Disp: 14 tablet, Rfl: 0 .  lisinopril (ZESTRIL) 10 MG tablet, Take 1 tablet (10 mg total) by mouth daily., Disp: 90 tablet, Rfl: 0 .  Lutein 20 MG CAPS, Take 1 capsule by mouth daily., Disp: , Rfl:  .  metoprolol tartrate (LOPRESSOR) 25 MG tablet, Take 1 tablet (25 mg total) by mouth 2 (two) times daily., Disp: 90 tablet, Rfl: 1 .  Saw Palmetto 450 MG CAPS, Take 2 capsules by mouth daily. , Disp: , Rfl:  .  vitamin C (ASCORBIC ACID) 500 MG tablet, Take 500 mg by mouth daily., Disp: , Rfl:  .  nitroGLYCERIN (NITROSTAT) 0.4 MG SL tablet,  Place 1 tablet (0.4 mg total) under the tongue every 5 (five) minutes as needed for chest pain. (Patient not taking: Reported on 06/16/2020), Disp: 25 tablet, Rfl: 3 Social History   Socioeconomic History  . Marital status: Married    Spouse name: Not on file  . Number of children: 3  . Years of education: 16  . Highest education level: Bachelor's degree (e.g., BA, AB, BS)  Occupational History    Employer: [employer]  Tobacco Use  . Smoking status: Former Smoker    Packs/day: 3.00    Years: 30.00    Pack years: 90.00    Quit date: 08/03/1997    Years since quitting: 23.2  . Smokeless tobacco: Never Used  Vaping Use  . Vaping Use: Never used  Substance and Sexual Activity  . Alcohol use: Not Currently  . Drug use: Not Currently  . Sexual activity: Not Currently  Other Topics Concern  . Not on file  Social History Narrative  . Not on file   Social Determinants of Health   Financial Resource Strain: Not on file  Food Insecurity: Not on file  Transportation Needs: Not on file  Physical Activity: Not on file  Stress: Not on file  Social Connections: Not on file  Intimate  Partner Violence: Not on file   Family History  Problem Relation Age of Onset  . Cancer Mother        breast  . Emphysema Father   . Cirrhosis Brother   . Alcohol abuse Brother     Objective: Office vital signs reviewed. BP 131/90   Pulse 84   Temp (!) 97.2 F (36.2 C)   Ht 6\' 5"  (1.956 m)   Wt 212 lb 6.4 oz (96.3 kg)   SpO2 97%   BMI 25.19 kg/m   Physical Examination:  General: Awake, alert, well nourished, nontoxic appearing, No acute distress Skin: 0.75 inch x 0.75 inch open wound noted on the left lower flank.  There was appreciable saturated packing noted.  Minimal bleeding.  Assessment/ Plan: 75 y.o. male   Encounter for wound care  Abscess  Patient was evaluated and treated in the prone position with provider at his anatomic left side.  He still had at least a 2-1/2 mm lip  to the wound noted along the right side.  The left side seems to be closing.  There was quite a bit of tissue from the cyst that was still present within the wound.  No active purulence noted.  Tissue appeared to be healthy and had good vascular supply.  He was hemostatic at time of discharge.  Home care instructions reviewed and he will follow-up on Monday for wound reassessment and packing if needed  We discussed consideration for evaluation by general surgery to in fact remove the entire cyst capsule should it recur, prior to any recurrent infections.  He was good understanding  No orders of the defined types were placed in this encounter.  No orders of the defined types were placed in this encounter.    Janora Norlander, DO [place] [facility] [phone]

## 2020-11-15 ENCOUNTER — Other Ambulatory Visit: Payer: Self-pay

## 2020-11-15 ENCOUNTER — Ambulatory Visit (INDEPENDENT_AMBULATORY_CARE_PROVIDER_SITE_OTHER): Payer: PPO | Admitting: Nurse Practitioner

## 2020-11-15 VITALS — BP 128/82 | HR 88 | Temp 97.6°F | Ht >= 80 in | Wt 211.0 lb

## 2020-11-15 DIAGNOSIS — L0291 Cutaneous abscess, unspecified: Secondary | ICD-10-CM

## 2020-11-15 NOTE — Assessment & Plan Note (Signed)
Surgical consult completed with Dr. Mendel Ryder.  She will reschedule patient next week.  Patient refused surgical schedule today.  Encourage patient to take the appointment, start antibiotic as prescribed. Abscess is not looking any better, skin around area is red with foul-smelling odor and serosanguineous discharge. Wound cleaned and packed with gauze.  Follow-up in 3 days.

## 2020-11-15 NOTE — Patient Instructions (Signed)
Please return/follow-up in 3 days.  Wound Care, Adult Taking care of your wound properly can help to prevent pain, infection, and scarring. It can also help your wound heal more quickly. Follow instructions from your health care provider about how to care for your wound. Supplies needed:  Soap and water.  Wound cleanser.  Gauze.  If needed, a clean bandage (dressing) or other type of wound dressing material to cover or place in the wound. Follow your health care provider's instructions about what dressing supplies to use.  Cream or ointment to apply to the wound, if told by your health care provider. How to care for your wound Cleaning the wound Ask your health care provider how to clean the wound. This may include:  Using mild soap and water or a wound cleanser.  Using a clean gauze to pat the wound dry after cleaning it. Do not rub or scrub the wound. Dressing care  Wash your hands with soap and water for at least 20 seconds before and after you change the dressing. If soap and water are not available, use hand sanitizer.  Change your dressing as told by your health care provider. This may include: ? Cleaning or rinsing out (irrigating) the wound. ? Placing a dressing over the wound or in the wound (packing). ? Covering the wound with an outer dressing.  Leave any stitches (sutures), skin glue, or adhesive strips in place. These skin closures may need to stay in place for 2 weeks or longer. If adhesive strip edges start to loosen and curl up, you may trim the loose edges. Do not remove adhesive strips completely unless your health care provider tells you to do that.  Ask your health care provider when you can leave the wound uncovered. Checking for infection Check your wound area every day for signs of infection. Check for:  More redness, swelling, or pain.  Fluid or blood.  Warmth.  Pus or a bad smell.   Follow these instructions at home Medicines  If you were  prescribed an antibiotic medicine, cream, or ointment, take or apply it as told by your health care provider. Do not stop using the antibiotic even if your condition improves.  If you were prescribed pain medicine, take it 30 minutes before you do any wound care or as told by your health care provider.  Take over-the-counter and prescription medicines only as told by your health care provider. Eating and drinking  Eat a diet that includes protein, vitamin A, vitamin C, and other nutrient-rich foods to help the wound heal. ? Foods rich in protein include meat, fish, eggs, dairy, beans, and nuts. ? Foods rich in vitamin A include carrots and dark green, leafy vegetables. ? Foods rich in vitamin C include citrus fruits, tomatoes, broccoli, and peppers.  Drink enough fluid to keep your urine pale yellow. General instructions  Do not take baths, swim, use a hot tub, or do anything that would put the wound underwater until your health care provider approves. Ask your health care provider if you may take showers. You may only be allowed to take sponge baths.  Do not scratch or pick at the wound. Keep it covered as told by your health care provider.  Return to your normal activities as told by your health care provider. Ask your health care provider what activities are safe for you.  Protect your wound from the sun when you are outside for the first 6 months, or for as long as told  by your health care provider. Cover up the scar area or apply sunscreen that has an SPF of at least 54.  Do not use any products that contain nicotine or tobacco, such as cigarettes, e-cigarettes, and chewing tobacco. These may delay wound healing. If you need help quitting, ask your health care provider.  Keep all follow-up visits as told by your health care provider. This is important. Contact a health care provider if:  You received a tetanus shot and you have swelling, severe pain, redness, or bleeding at the  injection site.  Your pain is not controlled with medicine.  You have any of these signs of infection: ? More redness, swelling, or pain around the wound. ? Fluid or blood coming from the wound. ? Warmth coming from the wound. ? Pus or a bad smell coming from the wound. ? A fever or chills.  You are nauseous or you vomit.  You are dizzy. Get help right away if:  You have a red streak of skin near the area around your wound.  Your wound has been closed with staples, sutures, skin glue, or adhesive strips and it begins to open up and separate.  Your wound is bleeding, and the bleeding does not stop with gentle pressure.  You have a rash.  You faint.  You have trouble breathing. These symptoms may represent a serious problem that is an emergency. Do not wait to see if the symptoms will go away. Get medical help right away. Call your local emergency services (911 in the [place] Do not drive yourself to the hospital. Summary  Always wash your hands with soap and water for at least 20 seconds before and after changing your dressing.  Change your dressing as told by your health care provider.  To help with healing, eat foods that are rich in protein, vitamin A, vitamin C, and other nutrients.  Check your wound every day for signs of infection. Contact your health care provider if you suspect that your wound is infected. This information is not intended to replace advice given to you by your health care provider. Make sure you discuss any questions you have with your health care provider. Document Revised: 04/04/2019 Document Reviewed: 04/04/2019 Elsevier Patient Education  2021 [employer].

## 2020-11-15 NOTE — Progress Notes (Signed)
Acute Office Visit  Subjective:    Patient ID: William Jefferson, male    DOB: August 23, 1945, 75 y.o.   MRN: [id number]  Chief Complaint  Patient presents with  . Abscess    HPI Patient is in today for Wound Check: Patient presents for wound check. Patient has an open abscess site wound which is located on the left upper back. Current symptoms: none.  Wound measures 2.5 x 2.5.  Skin around wound area is erythematous, with uneven edges. wound bed is healthy looking.  Patient reports that he has not started taking his antibiotic.  Symptoms began a few weeks ago. Pain is rated 6/10. Interventions to date: packing changed 3 days ago.   Past Medical History:  Diagnosis Date  . Bladder stone    removed in 2014 by Dr. Jeffie Pollock  . Cataract   . Coronary artery disease   . Hyperlipidemia    statin intolerance  . Hypertension     Past Surgical History:  Procedure Laterality Date  . CARDIAC CATHETERIZATION  06/10/1998   large P150 iliac stent to RCA - 3.5x68mm NIR stent (Dr. Marella Chimes)  . CARDIAC CATHETERIZATION  07/16/2007   mid-distal LAD stenosis, normal LV function, 2.5x73mm Promus stent to obtuse marginal, 2.5x82mm Promus stent to osital & proximal diagonal (Dr. Marella Chimes, Dr. Domenic Moras)  . MELANOMA EXCISION  2018  . NM MYOCAR PERF WALL MOTION  08/2008   bruce myoview; normal pattern of perfusion, EF 77%  . PROSTATE BIOPSY  2002  . TRANSTHORACIC ECHOCARDIOGRAM  08/2012   mild conc LVH    Family History  Problem Relation Age of Onset  . Cancer Mother        breast  . Emphysema Father   . Cirrhosis Brother   . Alcohol abuse Brother     Social History   Socioeconomic History  . Marital status: Married    Spouse name: Not on file  . Number of children: 3  . Years of education: 16  . Highest education level: Bachelor's degree (e.g., BA, AB, BS)  Occupational History    Employer: [employer]  Tobacco Use  . Smoking status: Former Smoker    Packs/day: 3.00    Years:  30.00    Pack years: 90.00    Quit date: 08/03/1997    Years since quitting: 23.3  . Smokeless tobacco: Never Used  Vaping Use  . Vaping Use: Never used  Substance and Sexual Activity  . Alcohol use: Not Currently  . Drug use: Not Currently  . Sexual activity: Not Currently  Other Topics Concern  . Not on file  Social History Narrative  . Not on file   Social Determinants of Health   Financial Resource Strain: Not on file  Food Insecurity: Not on file  Transportation Needs: Not on file  Physical Activity: Not on file  Stress: Not on file  Social Connections: Not on file  Intimate Partner Violence: Not on file    Outpatient Medications Prior to Visit  Medication Sig Dispense Refill  . AMBULATORY NON FORMULARY MEDICATION Take 180 mg by mouth daily. Medication Name:bempedoic acid vs a placebo, CLEAR Research Study drug provided    . apixaban (ELIQUIS) 5 MG TABS tablet Take 1 tablet (5 mg total) by mouth 2 (two) times daily. 60 tablet 3  . aspirin EC 81 MG tablet Take 1 tablet (81 mg total) by mouth daily. Swallow whole. 90 tablet 3  . cephALEXin (KEFLEX) 500 MG capsule Take  by mouth.    . doxycycline (VIBRA-TABS) 100 MG tablet Take 1 tablet (100 mg total) by mouth 2 (two) times daily. 14 tablet 0  . lisinopril (ZESTRIL) 10 MG tablet Take 1 tablet (10 mg total) by mouth daily. 90 tablet 0  . Lutein 20 MG CAPS Take 1 capsule by mouth daily.    . metoprolol tartrate (LOPRESSOR) 25 MG tablet Take 1 tablet (25 mg total) by mouth 2 (two) times daily. 90 tablet 1  . Saw Palmetto 450 MG CAPS Take 2 capsules by mouth daily.     . vitamin C (ASCORBIC ACID) 500 MG tablet Take 500 mg by mouth daily.    . nitroGLYCERIN (NITROSTAT) 0.4 MG SL tablet Place 1 tablet (0.4 mg total) under the tongue every 5 (five) minutes as needed for chest pain. (Patient not taking: Reported on 06/16/2020) 25 tablet 3   No facility-administered medications prior to visit.    Allergies  Allergen Reactions  .  Crestor [Rosuvastatin]   . Lipitor [Atorvastatin]   . Penicillins   . Sulfa Antibiotics   . Zetia [Ezetimibe]   . Zocor [Simvastatin]     Review of Systems  Constitutional: Negative.   HENT: Negative.   Respiratory: Negative.   Cardiovascular: Negative.   Gastrointestinal: Negative.   Skin: Positive for color change and wound.  All other systems reviewed and are negative.      Objective:    Physical Exam Vitals reviewed.  Constitutional:      Appearance: Normal appearance.  HENT:     Head: Normocephalic.     Nose: Nose normal.  Eyes:     Conjunctiva/sclera: Conjunctivae normal.  Cardiovascular:     Rate and Rhythm: Normal rate and regular rhythm.     Pulses: Normal pulses.     Heart sounds: Normal heart sounds.  Pulmonary:     Effort: Pulmonary effort is normal.     Breath sounds: Normal breath sounds.  Abdominal:     General: Bowel sounds are normal.  Skin:    Findings: Erythema present.          Comments: Open abscess wound measuring 2.5 x 2.5   Neurological:     Mental Status: He is alert and oriented to person, place, and time.  Psychiatric:        Behavior: Behavior normal.     BP 128/82   Pulse 88   Temp 97.6 F (36.4 C) (Temporal)   Ht 6\' 10"  (2.083 m)   Wt 211 lb (95.7 kg)   SpO2 97%   BMI 22.06 kg/m  Wt Readings from Last 3 Encounters:  11/15/20 211 lb (95.7 kg)  11/12/20 212 lb 6.4 oz (96.3 kg)  11/10/20 211 lb (95.7 kg)    Health Maintenance Due  Topic Date Due  . Hepatitis C Screening  Never done  . COLONOSCOPY (Pts 45-24yrs Insurance coverage will need to be confirmed)  Never done    There are no preventive care reminders to display for this patient.   Lab Results  Component Value Date   TSH 0.891 08/02/2020   Lab Results  Component Value Date   WBC 8.4 08/02/2020   HGB 16.4 08/02/2020   HCT 48.7 08/02/2020   MCV 94 08/02/2020   PLT 271 08/02/2020   Lab Results  Component Value Date   NA 142 08/02/2020   K 4.6  08/02/2020   CO2 22 08/02/2020   GLUCOSE 96 08/02/2020   BUN 16 08/02/2020   CREATININE 1.24  08/02/2020   BILITOT 0.4 06/03/2020   ALKPHOS 72 06/03/2020   AST 13 06/03/2020   ALT 19 06/03/2020   PROT 7.1 06/03/2020   ALBUMIN 4.5 06/03/2020   CALCIUM 9.8 08/02/2020   Lab Results  Component Value Date   CHOL 198 06/03/2020   Lab Results  Component Value Date   HDL 26 (L) 06/03/2020   Lab Results  Component Value Date   LDLCALC 131 (H) 06/03/2020   Lab Results  Component Value Date   TRIG 229 (H) 06/03/2020   Lab Results  Component Value Date   CHOLHDL 7.6 (H) 06/03/2020   No results found for: HGBA1C     Assessment & Plan:   Problem List Items Addressed This Visit      Other   Abscess    Surgical consult completed with Dr. Mendel Ryder.  She will reschedule patient next week.  Patient refused surgical schedule today.  Encourage patient to take the appointment, start antibiotic as prescribed. Abscess is not looking any better, skin around area is red with foul-smelling odor and serosanguineous discharge. Wound cleaned and packed with gauze.  Follow-up in 3 days.             [name], NP

## 2020-11-16 LAB — ANAEROBIC AND AEROBIC CULTURE

## 2020-11-17 ENCOUNTER — Ambulatory Visit (INDEPENDENT_AMBULATORY_CARE_PROVIDER_SITE_OTHER): Payer: PPO | Admitting: Nurse Practitioner

## 2020-11-17 ENCOUNTER — Other Ambulatory Visit: Payer: Self-pay

## 2020-11-17 VITALS — BP 132/85 | HR 74 | Temp 97.9°F | Ht >= 80 in | Wt 211.0 lb

## 2020-11-17 DIAGNOSIS — L0291 Cutaneous abscess, unspecified: Secondary | ICD-10-CM

## 2020-11-17 DIAGNOSIS — Z5189 Encounter for other specified aftercare: Secondary | ICD-10-CM

## 2020-11-17 NOTE — Patient Instructions (Signed)
Wound Care, Adult Taking care of your wound properly can help to prevent pain, infection, and scarring. It can also help your wound heal more quickly. Follow instructions from your health care provider about how to care for your wound. Supplies needed:  Soap and water.  Wound cleanser.  Gauze.  If needed, a clean bandage (dressing) or other type of wound dressing material to cover or place in the wound. Follow your health care provider's instructions about what dressing supplies to use.  Cream or ointment to apply to the wound, if told by your health care provider. How to care for your wound Cleaning the wound Ask your health care provider how to clean the wound. This may include:  Using mild soap and water or a wound cleanser.  Using a clean gauze to pat the wound dry after cleaning it. Do not rub or scrub the wound. Dressing care  Wash your hands with soap and water for at least 20 seconds before and after you change the dressing. If soap and water are not available, use hand sanitizer.  Change your dressing as told by your health care provider. This may include: ? Cleaning or rinsing out (irrigating) the wound. ? Placing a dressing over the wound or in the wound (packing). ? Covering the wound with an outer dressing.  Leave any stitches (sutures), skin glue, or adhesive strips in place. These skin closures may need to stay in place for 2 weeks or longer. If adhesive strip edges start to loosen and curl up, you may trim the loose edges. Do not remove adhesive strips completely unless your health care provider tells you to do that.  Ask your health care provider when you can leave the wound uncovered. Checking for infection Check your wound area every day for signs of infection. Check for:  More redness, swelling, or pain.  Fluid or blood.  Warmth.  Pus or a bad smell.   Follow these instructions at home Medicines  If you were prescribed an antibiotic medicine, cream, or  ointment, take or apply it as told by your health care provider. Do not stop using the antibiotic even if your condition improves.  If you were prescribed pain medicine, take it 30 minutes before you do any wound care or as told by your health care provider.  Take over-the-counter and prescription medicines only as told by your health care provider. Eating and drinking  Eat a diet that includes protein, vitamin A, vitamin C, and other nutrient-rich foods to help the wound heal. ? Foods rich in protein include meat, fish, eggs, dairy, beans, and nuts. ? Foods rich in vitamin A include carrots and dark green, leafy vegetables. ? Foods rich in vitamin C include citrus fruits, tomatoes, broccoli, and peppers.  Drink enough fluid to keep your urine pale yellow. General instructions  Do not take baths, swim, use a hot tub, or do anything that would put the wound underwater until your health care provider approves. Ask your health care provider if you may take showers. You may only be allowed to take sponge baths.  Do not scratch or pick at the wound. Keep it covered as told by your health care provider.  Return to your normal activities as told by your health care provider. Ask your health care provider what activities are safe for you.  Protect your wound from the sun when you are outside for the first 6 months, or for as long as told by your health care provider. Cover   up the scar area or apply sunscreen that has an SPF of at least 30.  Do not use any products that contain nicotine or tobacco, such as cigarettes, e-cigarettes, and chewing tobacco. These may delay wound healing. If you need help quitting, ask your health care provider.  Keep all follow-up visits as told by your health care provider. This is important. Contact a health care provider if:  You received a tetanus shot and you have swelling, severe pain, redness, or bleeding at the injection site.  Your pain is not controlled  with medicine.  You have any of these signs of infection: ? More redness, swelling, or pain around the wound. ? Fluid or blood coming from the wound. ? Warmth coming from the wound. ? Pus or a bad smell coming from the wound. ? A fever or chills.  You are nauseous or you vomit.  You are dizzy. Get help right away if:  You have a red streak of skin near the area around your wound.  Your wound has been closed with staples, sutures, skin glue, or adhesive strips and it begins to open up and separate.  Your wound is bleeding, and the bleeding does not stop with gentle pressure.  You have a rash.  You faint.  You have trouble breathing. These symptoms may represent a serious problem that is an emergency. Do not wait to see if the symptoms will go away. Get medical help right away. Call your local emergency services (911 in the [place] Do not drive yourself to the hospital. Summary  Always wash your hands with soap and water for at least 20 seconds before and after changing your dressing.  Change your dressing as told by your health care provider.  To help with healing, eat foods that are rich in protein, vitamin A, vitamin C, and other nutrients.  Check your wound every day for signs of infection. Contact your health care provider if you suspect that your wound is infected. This information is not intended to replace advice given to you by your health care provider. Make sure you discuss any questions you have with your health care provider. Document Revised: 04/04/2019 Document Reviewed: 04/04/2019 Elsevier Patient Education  2021 Elsevier Inc.  

## 2020-11-17 NOTE — Assessment & Plan Note (Signed)
Wound healing as expected. Follow up in 3 days. Complete antibiotics as prescribed. Wound measured, cleaned and packed. Pt tolerated procedure well.

## 2020-11-17 NOTE — Progress Notes (Signed)
Established Patient Office Visit  Subjective:  Patient ID: William Jefferson, male    DOB: 04/30/1946  Age: 75 y.o. MRN: [id number]  CC:  Chief Complaint  Patient presents with  . Abscess    HPI . [name] presents for    Wound Check: Patient presents for wound check. Patient has a abscess wound which is located on the left upper back. Current symptoms: wound healing as expected. Symptoms began a few months ago. Pain is rated 4/10. Interventions to date: cleaned and packed today   Past Medical History:  Diagnosis Date  . Bladder stone    removed in 2014 by Dr. Jeffie Pollock  . Cataract   . Coronary artery disease   . Hyperlipidemia    statin intolerance  . Hypertension     Past Surgical History:  Procedure Laterality Date  . CARDIAC CATHETERIZATION  06/10/1998   large P150 iliac stent to RCA - 3.5x61mm NIR stent (Dr. Marella Chimes)  . CARDIAC CATHETERIZATION  07/16/2007   mid-distal LAD stenosis, normal LV function, 2.5x20mm Promus stent to obtuse marginal, 2.5x53mm Promus stent to osital & proximal diagonal (Dr. Marella Chimes, Dr. Domenic Moras)  . MELANOMA EXCISION  2018  . NM MYOCAR PERF WALL MOTION  08/2008   bruce myoview; normal pattern of perfusion, EF 77%  . PROSTATE BIOPSY  2002  . TRANSTHORACIC ECHOCARDIOGRAM  08/2012   mild conc LVH    Family History  Problem Relation Age of Onset  . Cancer Mother        breast  . Emphysema Father   . Cirrhosis Brother   . Alcohol abuse Brother     Social History   Socioeconomic History  . Marital status: Married    Spouse name: Not on file  . Number of children: 3  . Years of education: 16  . Highest education level: Bachelor's degree (e.g., BA, AB, BS)  Occupational History    Employer: [employer]  Tobacco Use  . Smoking status: Former Smoker    Packs/day: 3.00    Years: 30.00    Pack years: 90.00    Quit date: 08/03/1997    Years since quitting: 23.3  . Smokeless tobacco: Never Used  Vaping Use  . Vaping  Use: Never used  Substance and Sexual Activity  . Alcohol use: Not Currently  . Drug use: Not Currently  . Sexual activity: Not Currently  Other Topics Concern  . Not on file  Social History Narrative  . Not on file   Social Determinants of Health   Financial Resource Strain: Not on file  Food Insecurity: Not on file  Transportation Needs: Not on file  Physical Activity: Not on file  Stress: Not on file  Social Connections: Not on file  Intimate Partner Violence: Not on file    Outpatient Medications Prior to Visit  Medication Sig Dispense Refill  . AMBULATORY NON FORMULARY MEDICATION Take 180 mg by mouth daily. Medication Name:bempedoic acid vs a placebo, CLEAR Research Study drug provided    . apixaban (ELIQUIS) 5 MG TABS tablet Take 1 tablet (5 mg total) by mouth 2 (two) times daily. 60 tablet 3  . aspirin EC 81 MG tablet Take 1 tablet (81 mg total) by mouth daily. Swallow whole. 90 tablet 3  . doxycycline (VIBRA-TABS) 100 MG tablet Take 1 tablet (100 mg total) by mouth 2 (two) times daily. 14 tablet 0  . lisinopril (ZESTRIL) 10 MG tablet Take 1 tablet (10 mg total) by  mouth daily. 90 tablet 0  . Lutein 20 MG CAPS Take 1 capsule by mouth daily.    . metoprolol tartrate (LOPRESSOR) 25 MG tablet Take 1 tablet (25 mg total) by mouth 2 (two) times daily. 90 tablet 1  . Saw Palmetto 450 MG CAPS Take 2 capsules by mouth daily.     . vitamin C (ASCORBIC ACID) 500 MG tablet Take 500 mg by mouth daily.    . nitroGLYCERIN (NITROSTAT) 0.4 MG SL tablet Place 1 tablet (0.4 mg total) under the tongue every 5 (five) minutes as needed for chest pain. (Patient not taking: Reported on 06/16/2020) 25 tablet 3  . cephALEXin (KEFLEX) 500 MG capsule Take by mouth.     No facility-administered medications prior to visit.    Allergies  Allergen Reactions  . Crestor [Rosuvastatin]   . Lipitor [Atorvastatin]   . Penicillins   . Sulfa Antibiotics   . Zetia [Ezetimibe]   . Zocor [Simvastatin]      ROS Review of Systems  HENT: Negative.   Respiratory: Negative.   Genitourinary: Negative.   Musculoskeletal: Negative.   Skin: Positive for wound.  All other systems reviewed and are negative.     Objective:    Physical Exam Vitals and nursing note reviewed.  Constitutional:      Appearance: Normal appearance.  HENT:     Head: Normocephalic.     Nose: Nose normal.  Cardiovascular:     Rate and Rhythm: Normal rate and regular rhythm.     Pulses: Normal pulses.     Heart sounds: Normal heart sounds.  Pulmonary:     Effort: Pulmonary effort is normal.     Breath sounds: Normal breath sounds.  Abdominal:     General: Bowel sounds are normal.  Musculoskeletal:        General: Normal range of motion.  Skin:    General: Skin is warm.     Findings: Abscess, ecchymosis and wound present.  Neurological:     Mental Status: He is alert and oriented to person, place, and time.  Psychiatric:        Behavior: Behavior normal.     BP 132/85   Pulse 74   Temp 97.9 F (36.6 C) (Temporal)   Ht 7' (2.134 m)   Wt 211 lb (95.7 kg)   SpO2 99%   BMI 21.02 kg/m  Wt Readings from Last 3 Encounters:  11/17/20 211 lb (95.7 kg)  11/15/20 211 lb (95.7 kg)  11/12/20 212 lb 6.4 oz (96.3 kg)     Health Maintenance Due  Topic Date Due  . Hepatitis C Screening  Never done  . COLONOSCOPY (Pts 45-60yrs Insurance coverage will need to be confirmed)  Never done    There are no preventive care reminders to display for this patient.  Lab Results  Component Value Date   TSH 0.891 08/02/2020   Lab Results  Component Value Date   WBC 8.4 08/02/2020   HGB 16.4 08/02/2020   HCT 48.7 08/02/2020   MCV 94 08/02/2020   PLT 271 08/02/2020   Lab Results  Component Value Date   NA 142 08/02/2020   K 4.6 08/02/2020   CO2 22 08/02/2020   GLUCOSE 96 08/02/2020   BUN 16 08/02/2020   CREATININE 1.24 08/02/2020   BILITOT 0.4 06/03/2020   ALKPHOS 72 06/03/2020   AST 13 06/03/2020    ALT 19 06/03/2020   PROT 7.1 06/03/2020   ALBUMIN 4.5 06/03/2020   CALCIUM 9.8  08/02/2020   Lab Results  Component Value Date   CHOL 198 06/03/2020   Lab Results  Component Value Date   HDL 26 (L) 06/03/2020   Lab Results  Component Value Date   LDLCALC 131 (H) 06/03/2020   Lab Results  Component Value Date   TRIG 229 (H) 06/03/2020   Lab Results  Component Value Date   CHOLHDL 7.6 (H) 06/03/2020   No results found for: HGBA1C    Assessment & Plan:   Problem List Items Addressed This Visit      Other   Abscess    Wound healing as expected. Follow up in 3 days. Complete antibiotics as prescribed. Wound measured, cleaned and packed. Pt tolerated procedure well.       Other Visit Diagnoses    Encounter for wound care    -  Primary      No orders of the defined types were placed in this encounter.   Follow-up: Return in about 3 days (around 11/20/2020).    [name], NP

## 2020-11-19 ENCOUNTER — Other Ambulatory Visit: Payer: Self-pay

## 2020-11-19 ENCOUNTER — Other Ambulatory Visit: Payer: Self-pay | Admitting: Nurse Practitioner

## 2020-11-19 ENCOUNTER — Encounter: Payer: Self-pay | Admitting: Nurse Practitioner

## 2020-11-19 ENCOUNTER — Ambulatory Visit (INDEPENDENT_AMBULATORY_CARE_PROVIDER_SITE_OTHER): Payer: PPO | Admitting: Nurse Practitioner

## 2020-11-19 DIAGNOSIS — L0291 Cutaneous abscess, unspecified: Secondary | ICD-10-CM

## 2020-11-19 NOTE — Patient Instructions (Signed)
Wound Care, Adult Taking care of your wound properly can help to prevent pain, infection, and scarring. It can also help your wound heal more quickly. Follow instructions from your health care provider about how to care for your wound. Supplies needed:  Soap and water.  Wound cleanser.  Gauze.  If needed, a clean bandage (dressing) or other type of wound dressing material to cover or place in the wound. Follow your health care provider's instructions about what dressing supplies to use.  Cream or ointment to apply to the wound, if told by your health care provider. How to care for your wound Cleaning the wound Ask your health care provider how to clean the wound. This may include:  Using mild soap and water or a wound cleanser.  Using a clean gauze to pat the wound dry after cleaning it. Do not rub or scrub the wound. Dressing care  Wash your hands with soap and water for at least 20 seconds before and after you change the dressing. If soap and water are not available, use hand sanitizer.  Change your dressing as told by your health care provider. This may include: ? Cleaning or rinsing out (irrigating) the wound. ? Placing a dressing over the wound or in the wound (packing). ? Covering the wound with an outer dressing.  Leave any stitches (sutures), skin glue, or adhesive strips in place. These skin closures may need to stay in place for 2 weeks or longer. If adhesive strip edges start to loosen and curl up, you may trim the loose edges. Do not remove adhesive strips completely unless your health care provider tells you to do that.  Ask your health care provider when you can leave the wound uncovered. Checking for infection Check your wound area every day for signs of infection. Check for:  More redness, swelling, or pain.  Fluid or blood.  Warmth.  Pus or a bad smell.   Follow these instructions at home Medicines  If you were prescribed an antibiotic medicine, cream, or  ointment, take or apply it as told by your health care provider. Do not stop using the antibiotic even if your condition improves.  If you were prescribed pain medicine, take it 30 minutes before you do any wound care or as told by your health care provider.  Take over-the-counter and prescription medicines only as told by your health care provider. Eating and drinking  Eat a diet that includes protein, vitamin A, vitamin C, and other nutrient-rich foods to help the wound heal. ? Foods rich in protein include meat, fish, eggs, dairy, beans, and nuts. ? Foods rich in vitamin A include carrots and dark green, leafy vegetables. ? Foods rich in vitamin C include citrus fruits, tomatoes, broccoli, and peppers.  Drink enough fluid to keep your urine pale yellow. General instructions  Do not take baths, swim, use a hot tub, or do anything that would put the wound underwater until your health care provider approves. Ask your health care provider if you may take showers. You may only be allowed to take sponge baths.  Do not scratch or pick at the wound. Keep it covered as told by your health care provider.  Return to your normal activities as told by your health care provider. Ask your health care provider what activities are safe for you.  Protect your wound from the sun when you are outside for the first 6 months, or for as long as told by your health care provider. Cover   up the scar area or apply sunscreen that has an SPF of at least 30.  Do not use any products that contain nicotine or tobacco, such as cigarettes, e-cigarettes, and chewing tobacco. These may delay wound healing. If you need help quitting, ask your health care provider.  Keep all follow-up visits as told by your health care provider. This is important. Contact a health care provider if:  You received a tetanus shot and you have swelling, severe pain, redness, or bleeding at the injection site.  Your pain is not controlled  with medicine.  You have any of these signs of infection: ? More redness, swelling, or pain around the wound. ? Fluid or blood coming from the wound. ? Warmth coming from the wound. ? Pus or a bad smell coming from the wound. ? A fever or chills.  You are nauseous or you vomit.  You are dizzy. Get help right away if:  You have a red streak of skin near the area around your wound.  Your wound has been closed with staples, sutures, skin glue, or adhesive strips and it begins to open up and separate.  Your wound is bleeding, and the bleeding does not stop with gentle pressure.  You have a rash.  You faint.  You have trouble breathing. These symptoms may represent a serious problem that is an emergency. Do not wait to see if the symptoms will go away. Get medical help right away. Call your local emergency services (911 in the [place] Do not drive yourself to the hospital. Summary  Always wash your hands with soap and water for at least 20 seconds before and after changing your dressing.  Change your dressing as told by your health care provider.  To help with healing, eat foods that are rich in protein, vitamin A, vitamin C, and other nutrients.  Check your wound every day for signs of infection. Contact your health care provider if you suspect that your wound is infected. This information is not intended to replace advice given to you by your health care provider. Make sure you discuss any questions you have with your health care provider. Document Revised: 04/04/2019 Document Reviewed: 04/04/2019 Elsevier Patient Education  2021 Elsevier Inc.  

## 2020-11-19 NOTE — Progress Notes (Signed)
dme

## 2020-11-19 NOTE — Assessment & Plan Note (Addendum)
No changes to wound bed in the last 48 hours.  Wound cleaned, packed and dressed. Patient to follow-up in 7 days. Advised to finish antibiotic as prescribed.  Will reassess wound next week Thursday and change course of antibiotic to penicillin or Rocephin based on wound culture results.  Patient is out of town until Thursday next week.

## 2020-11-19 NOTE — Progress Notes (Signed)
Established Patient Office Visit  Subjective:  Patient ID: William Jefferson, male    DOB: 1946-06-20  Age: 75 y.o. MRN: [id number]  CC:  Chief Complaint  Patient presents with  . Abscess    HPI William Jefferson presents for The wound is cleansed, debrided of foreign material as much as possible, and dressed. The patient is alerted to watch for any signs of infection (redness, pus, pain, increased swelling or fever) and call if such occurs. Home wound care instructions are provided. Tetanus vaccination status reviewed: last tetanus booster within 10 years.  Past Medical History:  Diagnosis Date  . Bladder stone    removed in 2014 by Dr. Jeffie Pollock  . Cataract   . Coronary artery disease   . Hyperlipidemia    statin intolerance  . Hypertension     Past Surgical History:  Procedure Laterality Date  . CARDIAC CATHETERIZATION  06/10/1998   large P150 iliac stent to RCA - 3.5x109mm NIR stent (Dr. Marella Chimes)  . CARDIAC CATHETERIZATION  07/16/2007   mid-distal LAD stenosis, normal LV function, 2.5x25mm Promus stent to obtuse marginal, 2.5x20mm Promus stent to osital & proximal diagonal (Dr. Marella Chimes, Dr. Domenic Moras)  . MELANOMA EXCISION  2018  . NM MYOCAR PERF WALL MOTION  08/2008   bruce myoview; normal pattern of perfusion, EF 77%  . PROSTATE BIOPSY  2002  . TRANSTHORACIC ECHOCARDIOGRAM  08/2012   mild conc LVH    Family History  Problem Relation Age of Onset  . Cancer Mother        breast  . Emphysema Father   . Cirrhosis Brother   . Alcohol abuse Brother     Social History   Socioeconomic History  . Marital status: Married    Spouse name: Not on file  . Number of children: 3  . Years of education: 16  . Highest education level: Bachelor's degree (e.g., BA, AB, BS)  Occupational History    Employer: [employer]  Tobacco Use  . Smoking status: Former Smoker    Packs/day: 3.00    Years: 30.00    Pack years: 90.00    Quit date: 08/03/1997    Years since  quitting: 23.3  . Smokeless tobacco: Never Used  Vaping Use  . Vaping Use: Never used  Substance and Sexual Activity  . Alcohol use: Not Currently  . Drug use: Not Currently  . Sexual activity: Not Currently  Other Topics Concern  . Not on file  Social History Narrative  . Not on file   Social Determinants of Health   Financial Resource Strain: Not on file  Food Insecurity: Not on file  Transportation Needs: Not on file  Physical Activity: Not on file  Stress: Not on file  Social Connections: Not on file  Intimate Partner Violence: Not on file    Outpatient Medications Prior to Visit  Medication Sig Dispense Refill  . AMBULATORY NON FORMULARY MEDICATION Take 180 mg by mouth daily. Medication Name:bempedoic acid vs a placebo, CLEAR Research Study drug provided    . apixaban (ELIQUIS) 5 MG TABS tablet Take 1 tablet (5 mg total) by mouth 2 (two) times daily. 60 tablet 3  . aspirin EC 81 MG tablet Take 1 tablet (81 mg total) by mouth daily. Swallow whole. 90 tablet 3  . doxycycline (VIBRA-TABS) 100 MG tablet Take 1 tablet (100 mg total) by mouth 2 (two) times daily. 14 tablet 0  . lisinopril (ZESTRIL) 10 MG tablet Take 1  tablet (10 mg total) by mouth daily. 90 tablet 0  . Lutein 20 MG CAPS Take 1 capsule by mouth daily.    . metoprolol tartrate (LOPRESSOR) 25 MG tablet Take 1 tablet (25 mg total) by mouth 2 (two) times daily. 90 tablet 1  . Saw Palmetto 450 MG CAPS Take 2 capsules by mouth daily.     . vitamin C (ASCORBIC ACID) 500 MG tablet Take 500 mg by mouth daily.    . nitroGLYCERIN (NITROSTAT) 0.4 MG SL tablet Place 1 tablet (0.4 mg total) under the tongue every 5 (five) minutes as needed for chest pain. (Patient not taking: Reported on 06/16/2020) 25 tablet 3   No facility-administered medications prior to visit.    Allergies  Allergen Reactions  . Crestor [Rosuvastatin]   . Lipitor [Atorvastatin]   . Penicillins   . Sulfa Antibiotics   . Zetia [Ezetimibe]   . Zocor  [Simvastatin]     ROS Review of Systems  Constitutional: Negative.   HENT: Negative.   Respiratory: Negative.   Cardiovascular: Negative.   Gastrointestinal: Negative.   Genitourinary: Negative.   Musculoskeletal: Negative.   Skin: Positive for color change and wound.  All other systems reviewed and are negative.     Objective:    Physical Exam Vitals reviewed.  Constitutional:      Appearance: Normal appearance.  HENT:     Head: Normocephalic.     Nose: Nose normal.  Eyes:     Conjunctiva/sclera: Conjunctivae normal.  Cardiovascular:     Rate and Rhythm: Normal rate and regular rhythm.     Pulses: Normal pulses.     Heart sounds: Normal heart sounds.  Pulmonary:     Effort: Pulmonary effort is normal.     Breath sounds: Normal breath sounds.  Abdominal:     General: Bowel sounds are normal.  Skin:    General: Skin is warm.     Findings: Erythema and wound present.          Comments: Open abscess, redness  Neurological:     Mental Status: He is alert.     BP 126/83   Pulse 98   Temp 97.9 F (36.6 C) (Temporal)   Wt 211 lb (95.7 kg)   SpO2 97%   BMI 21.02 kg/m  Wt Readings from Last 3 Encounters:  11/19/20 211 lb (95.7 kg)  11/17/20 211 lb (95.7 kg)  11/15/20 211 lb (95.7 kg)     Health Maintenance Due  Topic Date Due  . Hepatitis C Screening  Never done  . COLONOSCOPY (Pts 45-16yrs Insurance coverage will need to be confirmed)  Never done    There are no preventive care reminders to display for this patient.  Lab Results  Component Value Date   TSH 0.891 08/02/2020   Lab Results  Component Value Date   WBC 8.4 08/02/2020   HGB 16.4 08/02/2020   HCT 48.7 08/02/2020   MCV 94 08/02/2020   PLT 271 08/02/2020   Lab Results  Component Value Date   NA 142 08/02/2020   K 4.6 08/02/2020   CO2 22 08/02/2020   GLUCOSE 96 08/02/2020   BUN 16 08/02/2020   CREATININE 1.24 08/02/2020   BILITOT 0.4 06/03/2020   ALKPHOS 72 06/03/2020   AST  13 06/03/2020   ALT 19 06/03/2020   PROT 7.1 06/03/2020   ALBUMIN 4.5 06/03/2020   CALCIUM 9.8 08/02/2020   Lab Results  Component Value Date   CHOL 198 06/03/2020  Lab Results  Component Value Date   HDL 26 (L) 06/03/2020   Lab Results  Component Value Date   LDLCALC 131 (H) 06/03/2020   Lab Results  Component Value Date   TRIG 229 (H) 06/03/2020   Lab Results  Component Value Date   CHOLHDL 7.6 (H) 06/03/2020   No results found for: HGBA1C    Assessment & Plan:   Problem List Items Addressed This Visit      Other   Abscess    No changes to wound bed in the last 48 hours.  Wound cleaned, packed and dressed. Patient to follow-up in 7 days. Advised to finish antibiotic as prescribed.  Will reassess wound next week Thursday and change course of antibiotic to penicillin or Rocephin based on wound culture results.  Patient is out of town until Thursday next week.         No orders of the defined types were placed in this encounter.   Follow-up: Return in about 1 week (around 11/26/2020).    [name], NP

## 2020-11-25 ENCOUNTER — Encounter: Payer: Self-pay | Admitting: Family Medicine

## 2020-11-25 ENCOUNTER — Other Ambulatory Visit: Payer: Self-pay

## 2020-11-25 ENCOUNTER — Ambulatory Visit (INDEPENDENT_AMBULATORY_CARE_PROVIDER_SITE_OTHER): Payer: PPO | Admitting: Family Medicine

## 2020-11-25 VITALS — BP 106/63 | HR 84 | Temp 97.5°F | Ht 77.0 in | Wt 211.2 lb

## 2020-11-25 DIAGNOSIS — Z5189 Encounter for other specified aftercare: Secondary | ICD-10-CM

## 2020-11-25 DIAGNOSIS — L0291 Cutaneous abscess, unspecified: Secondary | ICD-10-CM | POA: Diagnosis not present

## 2020-11-25 NOTE — Progress Notes (Signed)
Acute Office Visit  Subjective:    Patient ID: William Jefferson, male    DOB: [birth year], 75 y.o.   MRN: [id number]  Chief Complaint  Patient presents with  . Abscess    HPI Patient is in today for a wound recheck. He has had been out of town for a week. He did not have anyone to change his dressing and packing so this has not been changed since his visit last week. Denies pain, drainage, or fever.   Past Medical History:  Diagnosis Date  . Bladder stone    removed in 2014 by Dr. Jeffie Pollock  . Cataract   . Coronary artery disease   . Hyperlipidemia    statin intolerance  . Hypertension     Past Surgical History:  Procedure Laterality Date  . CARDIAC CATHETERIZATION  06/10/1998   large P150 iliac stent to RCA - 3.5x70mm NIR stent (Dr. Marella Chimes)  . CARDIAC CATHETERIZATION  07/16/2007   mid-distal LAD stenosis, normal LV function, 2.5x63mm Promus stent to obtuse marginal, 2.5x43mm Promus stent to osital & proximal diagonal (Dr. Marella Chimes, Dr. Domenic Moras)  . MELANOMA EXCISION  2018  . NM MYOCAR PERF WALL MOTION  08/2008   bruce myoview; normal pattern of perfusion, EF 77%  . PROSTATE BIOPSY  2002  . TRANSTHORACIC ECHOCARDIOGRAM  08/2012   mild conc LVH    Family History  Problem Relation Age of Onset  . Cancer Mother        breast  . Emphysema Father   . Cirrhosis Brother   . Alcohol abuse Brother     Social History   Socioeconomic History  . Marital status: Married    Spouse name: Not on file  . Number of children: 3  . Years of education: 16  . Highest education level: Bachelor's degree (e.g., BA, AB, BS)  Occupational History    Employer: [employer]  Tobacco Use  . Smoking status: Former Smoker    Packs/day: 3.00    Years: 30.00    Pack years: 90.00    Quit date: 08/03/1997    Years since quitting: 23.3  . Smokeless tobacco: Never Used  Vaping Use  . Vaping Use: Never used  Substance and Sexual Activity  . Alcohol use: Not Currently  . Drug use:  Not Currently  . Sexual activity: Not Currently  Other Topics Concern  . Not on file  Social History Narrative  . Not on file   Social Determinants of Health   Financial Resource Strain: Not on file  Food Insecurity: Not on file  Transportation Needs: Not on file  Physical Activity: Not on file  Stress: Not on file  Social Connections: Not on file  Intimate Partner Violence: Not on file    Outpatient Medications Prior to Visit  Medication Sig Dispense Refill  . AMBULATORY NON FORMULARY MEDICATION Take 180 mg by mouth daily. Medication Name:bempedoic acid vs a placebo, CLEAR Research Study drug provided    . apixaban (ELIQUIS) 5 MG TABS tablet Take 1 tablet (5 mg total) by mouth 2 (two) times daily. 60 tablet 3  . aspirin EC 81 MG tablet Take 1 tablet (81 mg total) by mouth daily. Swallow whole. 90 tablet 3  . lisinopril (ZESTRIL) 10 MG tablet Take 1 tablet (10 mg total) by mouth daily. 90 tablet 0  . Lutein 20 MG CAPS Take 1 capsule by mouth daily.    . metoprolol tartrate (LOPRESSOR) 25 MG tablet Take 1 tablet (  25 mg total) by mouth 2 (two) times daily. 90 tablet 1  . Saw Palmetto 450 MG CAPS Take 2 capsules by mouth daily.     . vitamin C (ASCORBIC ACID) 500 MG tablet Take 500 mg by mouth daily.    . nitroGLYCERIN (NITROSTAT) 0.4 MG SL tablet Place 1 tablet (0.4 mg total) under the tongue every 5 (five) minutes as needed for chest pain. (Patient not taking: Reported on 06/16/2020) 25 tablet 3  . doxycycline (VIBRA-TABS) 100 MG tablet Take 1 tablet (100 mg total) by mouth 2 (two) times daily. 14 tablet 0   No facility-administered medications prior to visit.    Allergies  Allergen Reactions  . Crestor [Rosuvastatin]   . Lipitor [Atorvastatin]   . Penicillins   . Sulfa Antibiotics   . Zetia [Ezetimibe]   . Zocor [Simvastatin]     Review of Systems As per HPI.     Objective:    Physical Exam Vitals and nursing note reviewed.  Constitutional:      General: He is not  in acute distress.    Appearance: Normal appearance. He is not ill-appearing, toxic-appearing or diaphoretic.  Pulmonary:     Effort: Pulmonary effort is normal. No respiratory distress.  Skin:    Findings: Wound present.          Comments: Serosanguinous drainage. Wound appear smaller than previous picture from last visit. No surround erythema. No foul odor.   Neurological:     General: No focal deficit present.     Mental Status: He is alert and oriented to person, place, and time.  Psychiatric:        Mood and Affect: Mood normal.        Behavior: Behavior normal.     BP 106/63   Pulse 84   Temp (!) 97.5 F (36.4 C) (Temporal)   Ht 6\' 5"  (1.956 m)   Wt 211 lb 4 oz (95.8 kg)   BMI 25.05 kg/m  Wt Readings from Last 3 Encounters:  11/25/20 211 lb 4 oz (95.8 kg)  11/19/20 211 lb (95.7 kg)  11/17/20 211 lb (95.7 kg)    Health Maintenance Due  Topic Date Due  . Hepatitis C Screening  Never done  . COLONOSCOPY (Pts 45-54yrs Insurance coverage will need to be confirmed)  Never done    There are no preventive care reminders to display for this patient.   Lab Results  Component Value Date   TSH 0.891 08/02/2020   Lab Results  Component Value Date   WBC 8.4 08/02/2020   HGB 16.4 08/02/2020   HCT 48.7 08/02/2020   MCV 94 08/02/2020   PLT 271 08/02/2020   Lab Results  Component Value Date   NA 142 08/02/2020   K 4.6 08/02/2020   CO2 22 08/02/2020   GLUCOSE 96 08/02/2020   BUN 16 08/02/2020   CREATININE 1.24 08/02/2020   BILITOT 0.4 06/03/2020   ALKPHOS 72 06/03/2020   AST 13 06/03/2020   ALT 19 06/03/2020   PROT 7.1 06/03/2020   ALBUMIN 4.5 06/03/2020   CALCIUM 9.8 08/02/2020   Lab Results  Component Value Date   CHOL 198 06/03/2020   Lab Results  Component Value Date   HDL 26 (L) 06/03/2020   Lab Results  Component Value Date   LDLCALC 131 (H) 06/03/2020   Lab Results  Component Value Date   TRIG 229 (H) 06/03/2020   Lab Results  Component  Value Date   CHOLHDL 7.6 (  H) 06/03/2020   No results found for: HGBA1C     Assessment & Plan:   William Jefferson was seen today for abscess.  Diagnoses and all orders for this visit:  Abscess Encounter for wound care Wound cleansed, repack, and dressed. Appears to be healing. Discussed need for daily dressing changes to promote healing.   Return in 1 day (on 11/26/2020) for dressing change.  The patient indicates understanding of these issues and agrees with the plan.   Gwenlyn Perking, FNP

## 2020-11-25 NOTE — Patient Instructions (Signed)
Wound Care, Adult Taking care of your wound properly can help to prevent pain, infection, and scarring. It can also help your wound heal more quickly. Follow instructions from your health care provider about how to care for your wound. Supplies needed:  Soap and water.  Wound cleanser.  Gauze.  If needed, a clean bandage (dressing) or other type of wound dressing material to cover or place in the wound. Follow your health care provider's instructions about what dressing supplies to use.  Cream or ointment to apply to the wound, if told by your health care provider. How to care for your wound Cleaning the wound Ask your health care provider how to clean the wound. This may include:  Using mild soap and water or a wound cleanser.  Using a clean gauze to pat the wound dry after cleaning it. Do not rub or scrub the wound. Dressing care  Wash your hands with soap and water for at least 20 seconds before and after you change the dressing. If soap and water are not available, use hand sanitizer.  Change your dressing as told by your health care provider. This may include: ? Cleaning or rinsing out (irrigating) the wound. ? Placing a dressing over the wound or in the wound (packing). ? Covering the wound with an outer dressing.  Leave any stitches (sutures), skin glue, or adhesive strips in place. These skin closures may need to stay in place for 2 weeks or longer. If adhesive strip edges start to loosen and curl up, you may trim the loose edges. Do not remove adhesive strips completely unless your health care provider tells you to do that.  Ask your health care provider when you can leave the wound uncovered. Checking for infection Check your wound area every day for signs of infection. Check for:  More redness, swelling, or pain.  Fluid or blood.  Warmth.  Pus or a bad smell.   Follow these instructions at home Medicines  If you were prescribed an antibiotic medicine, cream, or  ointment, take or apply it as told by your health care provider. Do not stop using the antibiotic even if your condition improves.  If you were prescribed pain medicine, take it 30 minutes before you do any wound care or as told by your health care provider.  Take over-the-counter and prescription medicines only as told by your health care provider. Eating and drinking  Eat a diet that includes protein, vitamin A, vitamin C, and other nutrient-rich foods to help the wound heal. ? Foods rich in protein include meat, fish, eggs, dairy, beans, and nuts. ? Foods rich in vitamin A include carrots and dark green, leafy vegetables. ? Foods rich in vitamin C include citrus fruits, tomatoes, broccoli, and peppers.  Drink enough fluid to keep your urine pale yellow. General instructions  Do not take baths, swim, use a hot tub, or do anything that would put the wound underwater until your health care provider approves. Ask your health care provider if you may take showers. You may only be allowed to take sponge baths.  Do not scratch or pick at the wound. Keep it covered as told by your health care provider.  Return to your normal activities as told by your health care provider. Ask your health care provider what activities are safe for you.  Protect your wound from the sun when you are outside for the first 6 months, or for as long as told by your health care provider. Cover   up the scar area or apply sunscreen that has an SPF of at least 30.  Do not use any products that contain nicotine or tobacco, such as cigarettes, e-cigarettes, and chewing tobacco. These may delay wound healing. If you need help quitting, ask your health care provider.  Keep all follow-up visits as told by your health care provider. This is important. Contact a health care provider if:  You received a tetanus shot and you have swelling, severe pain, redness, or bleeding at the injection site.  Your pain is not controlled  with medicine.  You have any of these signs of infection: ? More redness, swelling, or pain around the wound. ? Fluid or blood coming from the wound. ? Warmth coming from the wound. ? Pus or a bad smell coming from the wound. ? A fever or chills.  You are nauseous or you vomit.  You are dizzy. Get help right away if:  You have a red streak of skin near the area around your wound.  Your wound has been closed with staples, sutures, skin glue, or adhesive strips and it begins to open up and separate.  Your wound is bleeding, and the bleeding does not stop with gentle pressure.  You have a rash.  You faint.  You have trouble breathing. These symptoms may represent a serious problem that is an emergency. Do not wait to see if the symptoms will go away. Get medical help right away. Call your local emergency services (911 in the [place] Do not drive yourself to the hospital. Summary  Always wash your hands with soap and water for at least 20 seconds before and after changing your dressing.  Change your dressing as told by your health care provider.  To help with healing, eat foods that are rich in protein, vitamin A, vitamin C, and other nutrients.  Check your wound every day for signs of infection. Contact your health care provider if you suspect that your wound is infected. This information is not intended to replace advice given to you by your health care provider. Make sure you discuss any questions you have with your health care provider. Document Revised: 04/04/2019 Document Reviewed: 04/04/2019 Elsevier Patient Education  2021 Elsevier Inc.  

## 2020-11-25 NOTE — Addendum Note (Signed)
Addended by: [name] on: 11/25/2020 10:24 AM   Modules accepted: Level of Service

## 2020-11-25 NOTE — Addendum Note (Signed)
Addended by: [name] on: 11/25/2020 10:25 AM   Modules accepted: Level of Service

## 2020-11-26 ENCOUNTER — Encounter: Payer: Self-pay | Admitting: Nurse Practitioner

## 2020-11-26 ENCOUNTER — Ambulatory Visit (INDEPENDENT_AMBULATORY_CARE_PROVIDER_SITE_OTHER): Payer: PPO | Admitting: Nurse Practitioner

## 2020-11-26 VITALS — BP 119/81 | HR 89 | Temp 97.9°F | Ht 77.0 in | Wt 212.0 lb

## 2020-11-26 DIAGNOSIS — Z5189 Encounter for other specified aftercare: Secondary | ICD-10-CM | POA: Diagnosis not present

## 2020-11-26 DIAGNOSIS — L0291 Cutaneous abscess, unspecified: Secondary | ICD-10-CM

## 2020-11-26 NOTE — Assessment & Plan Note (Signed)
Wound healing as expected.  Wound cleaned and packed.  Education chills/signs and symptoms of infection provided to patient with printed handouts given.  Follow-up in 1 week.

## 2020-11-26 NOTE — Patient Instructions (Signed)
Wound Care, Adult Taking care of your wound properly can help to prevent pain, infection, and scarring. It can also help your wound heal more quickly. Follow instructions from your health care provider about how to care for your wound. Supplies needed:  Soap and water.  Wound cleanser.  Gauze.  If needed, a clean bandage (dressing) or other type of wound dressing material to cover or place in the wound. Follow your health care provider's instructions about what dressing supplies to use.  Cream or ointment to apply to the wound, if told by your health care provider. How to care for your wound Cleaning the wound Ask your health care provider how to clean the wound. This may include:  Using mild soap and water or a wound cleanser.  Using a clean gauze to pat the wound dry after cleaning it. Do not rub or scrub the wound. Dressing care  Wash your hands with soap and water for at least 20 seconds before and after you change the dressing. If soap and water are not available, use hand sanitizer.  Change your dressing as told by your health care provider. This may include: ? Cleaning or rinsing out (irrigating) the wound. ? Placing a dressing over the wound or in the wound (packing). ? Covering the wound with an outer dressing.  Leave any stitches (sutures), skin glue, or adhesive strips in place. These skin closures may need to stay in place for 2 weeks or longer. If adhesive strip edges start to loosen and curl up, you may trim the loose edges. Do not remove adhesive strips completely unless your health care provider tells you to do that.  Ask your health care provider when you can leave the wound uncovered. Checking for infection Check your wound area every day for signs of infection. Check for:  More redness, swelling, or pain.  Fluid or blood.  Warmth.  Pus or a bad smell.   Follow these instructions at home Medicines  If you were prescribed an antibiotic medicine, cream, or  ointment, take or apply it as told by your health care provider. Do not stop using the antibiotic even if your condition improves.  If you were prescribed pain medicine, take it 30 minutes before you do any wound care or as told by your health care provider.  Take over-the-counter and prescription medicines only as told by your health care provider. Eating and drinking  Eat a diet that includes protein, vitamin A, vitamin C, and other nutrient-rich foods to help the wound heal. ? Foods rich in protein include meat, fish, eggs, dairy, beans, and nuts. ? Foods rich in vitamin A include carrots and dark green, leafy vegetables. ? Foods rich in vitamin C include citrus fruits, tomatoes, broccoli, and peppers.  Drink enough fluid to keep your urine pale yellow. General instructions  Do not take baths, swim, use a hot tub, or do anything that would put the wound underwater until your health care provider approves. Ask your health care provider if you may take showers. You may only be allowed to take sponge baths.  Do not scratch or pick at the wound. Keep it covered as told by your health care provider.  Return to your normal activities as told by your health care provider. Ask your health care provider what activities are safe for you.  Protect your wound from the sun when you are outside for the first 6 months, or for as long as told by your health care provider. Cover   up the scar area or apply sunscreen that has an SPF of at least 30.  Do not use any products that contain nicotine or tobacco, such as cigarettes, e-cigarettes, and chewing tobacco. These may delay wound healing. If you need help quitting, ask your health care provider.  Keep all follow-up visits as told by your health care provider. This is important. Contact a health care provider if:  You received a tetanus shot and you have swelling, severe pain, redness, or bleeding at the injection site.  Your pain is not controlled  with medicine.  You have any of these signs of infection: ? More redness, swelling, or pain around the wound. ? Fluid or blood coming from the wound. ? Warmth coming from the wound. ? Pus or a bad smell coming from the wound. ? A fever or chills.  You are nauseous or you vomit.  You are dizzy. Get help right away if:  You have a red streak of skin near the area around your wound.  Your wound has been closed with staples, sutures, skin glue, or adhesive strips and it begins to open up and separate.  Your wound is bleeding, and the bleeding does not stop with gentle pressure.  You have a rash.  You faint.  You have trouble breathing. These symptoms may represent a serious problem that is an emergency. Do not wait to see if the symptoms will go away. Get medical help right away. Call your local emergency services (911 in the [place] Do not drive yourself to the hospital. Summary  Always wash your hands with soap and water for at least 20 seconds before and after changing your dressing.  Change your dressing as told by your health care provider.  To help with healing, eat foods that are rich in protein, vitamin A, vitamin C, and other nutrients.  Check your wound every day for signs of infection. Contact your health care provider if you suspect that your wound is infected. This information is not intended to replace advice given to you by your health care provider. Make sure you discuss any questions you have with your health care provider. Document Revised: 04/04/2019 Document Reviewed: 04/04/2019 Elsevier Patient Education  2021 Elsevier Inc.  

## 2020-11-26 NOTE — Progress Notes (Signed)
Acute Office Visit  Subjective:    Patient ID: William Jefferson, male    DOB: 08/24/1945, 75 y.o.   MRN: [id number]  No chief complaint on file.   HPI Patient is in today for wound check. The wound is cleansed and dressed. The patient is alerted to watch for any signs of infection (redness, pus, pain, increased swelling or fever) and call if such occurs. Home wound care instructions are provided. Tetanus vaccination status reviewed: last tetanus booster within 10 years.  Past Medical History:  Diagnosis Date  . Bladder stone    removed in 2014 by Dr. Jeffie Pollock  . Cataract   . Coronary artery disease   . Hyperlipidemia    statin intolerance  . Hypertension     Past Surgical History:  Procedure Laterality Date  . CARDIAC CATHETERIZATION  06/10/1998   large P150 iliac stent to RCA - 3.5x56mm NIR stent (Dr. Marella Chimes)  . CARDIAC CATHETERIZATION  07/16/2007   mid-distal LAD stenosis, normal LV function, 2.5x70mm Promus stent to obtuse marginal, 2.5x61mm Promus stent to osital & proximal diagonal (Dr. Marella Chimes, Dr. Domenic Moras)  . MELANOMA EXCISION  2018  . NM MYOCAR PERF WALL MOTION  08/2008   bruce myoview; normal pattern of perfusion, EF 77%  . PROSTATE BIOPSY  2002  . TRANSTHORACIC ECHOCARDIOGRAM  08/2012   mild conc LVH    Family History  Problem Relation Age of Onset  . Cancer Mother        breast  . Emphysema Father   . Cirrhosis Brother   . Alcohol abuse Brother     Social History   Socioeconomic History  . Marital status: Married    Spouse name: Not on file  . Number of children: 3  . Years of education: 16  . Highest education level: Bachelor's degree (e.g., BA, AB, BS)  Occupational History    Employer: [employer]  Tobacco Use  . Smoking status: Former Smoker    Packs/day: 3.00    Years: 30.00    Pack years: 90.00    Quit date: 08/03/1997    Years since quitting: 23.3  . Smokeless tobacco: Never Used  Vaping Use  . Vaping Use: Never used   Substance and Sexual Activity  . Alcohol use: Not Currently  . Drug use: Not Currently  . Sexual activity: Not Currently  Other Topics Concern  . Not on file  Social History Narrative  . Not on file   Social Determinants of Health   Financial Resource Strain: Not on file  Food Insecurity: Not on file  Transportation Needs: Not on file  Physical Activity: Not on file  Stress: Not on file  Social Connections: Not on file  Intimate Partner Violence: Not on file    Outpatient Medications Prior to Visit  Medication Sig Dispense Refill  . AMBULATORY NON FORMULARY MEDICATION Take 180 mg by mouth daily. Medication Name:bempedoic acid vs a placebo, CLEAR Research Study drug provided    . apixaban (ELIQUIS) 5 MG TABS tablet Take 1 tablet (5 mg total) by mouth 2 (two) times daily. 60 tablet 3  . aspirin EC 81 MG tablet Take 1 tablet (81 mg total) by mouth daily. Swallow whole. 90 tablet 3  . lisinopril (ZESTRIL) 10 MG tablet Take 1 tablet (10 mg total) by mouth daily. 90 tablet 0  . Lutein 20 MG CAPS Take 1 capsule by mouth daily.    . metoprolol tartrate (LOPRESSOR) 25 MG tablet Take 1  tablet (25 mg total) by mouth 2 (two) times daily. 90 tablet 1  . nitroGLYCERIN (NITROSTAT) 0.4 MG SL tablet Place 1 tablet (0.4 mg total) under the tongue every 5 (five) minutes as needed for chest pain. (Patient not taking: Reported on 06/16/2020) 25 tablet 3  . Saw Palmetto 450 MG CAPS Take 2 capsules by mouth daily.     . vitamin C (ASCORBIC ACID) 500 MG tablet Take 500 mg by mouth daily.     No facility-administered medications prior to visit.    Allergies  Allergen Reactions  . Crestor [Rosuvastatin]   . Lipitor [Atorvastatin]   . Penicillins   . Sulfa Antibiotics   . Zetia [Ezetimibe]   . Zocor [Simvastatin]     Review of Systems  HENT: Negative.   Respiratory: Negative.   Cardiovascular: Negative.   Gastrointestinal: Negative.   Skin: Positive for color change and wound.  All other  systems reviewed and are negative.      Objective:    Physical Exam Vitals reviewed.  Constitutional:      Appearance: Normal appearance.  HENT:     Head: Normocephalic.     Nose: Nose normal.  Cardiovascular:     Rate and Rhythm: Normal rate and regular rhythm.     Pulses: Normal pulses.     Heart sounds: Normal heart sounds.  Pulmonary:     Effort: Pulmonary effort is normal.     Breath sounds: Normal breath sounds.  Abdominal:     General: Bowel sounds are normal.  Skin:    Findings: Erythema and wound present.  Neurological:     Mental Status: He is alert and oriented to person, place, and time.     BP 119/81   Pulse 89   Temp 97.9 F (36.6 C) (Temporal)   Ht 6\' 5"  (1.956 m)   Wt 212 lb (96.2 kg)   SpO2 99%   BMI 25.14 kg/m  Wt Readings from Last 3 Encounters:  11/26/20 212 lb (96.2 kg)  11/25/20 211 lb 4 oz (95.8 kg)  11/19/20 211 lb (95.7 kg)    Health Maintenance Due  Topic Date Due  . Hepatitis C Screening  Never done  . COLONOSCOPY (Pts 45-59yrs Insurance coverage will need to be confirmed)  Never done  . Zoster Vaccines- Shingrix (1 of 2) Never done     Lab Results  Component Value Date   TSH 0.891 08/02/2020   Lab Results  Component Value Date   WBC 8.4 08/02/2020   HGB 16.4 08/02/2020   HCT 48.7 08/02/2020   MCV 94 08/02/2020   PLT 271 08/02/2020   Lab Results  Component Value Date   NA 142 08/02/2020   K 4.6 08/02/2020   CO2 22 08/02/2020   GLUCOSE 96 08/02/2020   BUN 16 08/02/2020   CREATININE 1.24 08/02/2020   BILITOT 0.4 06/03/2020   ALKPHOS 72 06/03/2020   AST 13 06/03/2020   ALT 19 06/03/2020   PROT 7.1 06/03/2020   ALBUMIN 4.5 06/03/2020   CALCIUM 9.8 08/02/2020   Lab Results  Component Value Date   CHOL 198 06/03/2020   Lab Results  Component Value Date   HDL 26 (L) 06/03/2020   Lab Results  Component Value Date   LDLCALC 131 (H) 06/03/2020   Lab Results  Component Value Date   TRIG 229 (H) 06/03/2020    Lab Results  Component Value Date   CHOLHDL 7.6 (H) 06/03/2020   No results found for: HGBA1C  Assessment & Plan:   Problem List Items Addressed This Visit      Other   Abscess    Wound healing as expected.  Wound cleaned and packed.  Education chills/signs and symptoms of infection provided to patient with printed handouts given.  Follow-up in 1 week.         Other Visit Diagnoses    Encounter for wound care    -  Primary       No orders of the defined types were placed in this encounter.    [name], NP

## 2020-12-03 ENCOUNTER — Other Ambulatory Visit: Payer: Self-pay

## 2020-12-03 ENCOUNTER — Ambulatory Visit (INDEPENDENT_AMBULATORY_CARE_PROVIDER_SITE_OTHER): Payer: PPO | Admitting: Nurse Practitioner

## 2020-12-03 ENCOUNTER — Encounter: Payer: Self-pay | Admitting: Nurse Practitioner

## 2020-12-03 VITALS — BP 117/75 | HR 82 | Temp 97.1°F | Ht 75.0 in | Wt 211.0 lb

## 2020-12-03 DIAGNOSIS — L0291 Cutaneous abscess, unspecified: Secondary | ICD-10-CM

## 2020-12-03 NOTE — Patient Instructions (Signed)
Wound Care, Adult Taking care of your wound properly can help to prevent pain, infection, and scarring. It can also help your wound heal more quickly. Follow instructions from your health care provider about how to care for your wound. Supplies needed:  Soap and water.  Wound cleanser.  Gauze.  If needed, a clean bandage (dressing) or other type of wound dressing material to cover or place in the wound. Follow your health care provider's instructions about what dressing supplies to use.  Cream or ointment to apply to the wound, if told by your health care provider. How to care for your wound Cleaning the wound Ask your health care provider how to clean the wound. This may include:  Using mild soap and water or a wound cleanser.  Using a clean gauze to pat the wound dry after cleaning it. Do not rub or scrub the wound. Dressing care  Wash your hands with soap and water for at least 20 seconds before and after you change the dressing. If soap and water are not available, use hand sanitizer.  Change your dressing as told by your health care provider. This may include: ? Cleaning or rinsing out (irrigating) the wound. ? Placing a dressing over the wound or in the wound (packing). ? Covering the wound with an outer dressing.  Leave any stitches (sutures), skin glue, or adhesive strips in place. These skin closures may need to stay in place for 2 weeks or longer. If adhesive strip edges start to loosen and curl up, you may trim the loose edges. Do not remove adhesive strips completely unless your health care provider tells you to do that.  Ask your health care provider when you can leave the wound uncovered. Checking for infection Check your wound area every day for signs of infection. Check for:  More redness, swelling, or pain.  Fluid or blood.  Warmth.  Pus or a bad smell.   Follow these instructions at home Medicines  If you were prescribed an antibiotic medicine, cream, or  ointment, take or apply it as told by your health care provider. Do not stop using the antibiotic even if your condition improves.  If you were prescribed pain medicine, take it 30 minutes before you do any wound care or as told by your health care provider.  Take over-the-counter and prescription medicines only as told by your health care provider. Eating and drinking  Eat a diet that includes protein, vitamin A, vitamin C, and other nutrient-rich foods to help the wound heal. ? Foods rich in protein include meat, fish, eggs, dairy, beans, and nuts. ? Foods rich in vitamin A include carrots and dark green, leafy vegetables. ? Foods rich in vitamin C include citrus fruits, tomatoes, broccoli, and peppers.  Drink enough fluid to keep your urine pale yellow. General instructions  Do not take baths, swim, use a hot tub, or do anything that would put the wound underwater until your health care provider approves. Ask your health care provider if you may take showers. You may only be allowed to take sponge baths.  Do not scratch or pick at the wound. Keep it covered as told by your health care provider.  Return to your normal activities as told by your health care provider. Ask your health care provider what activities are safe for you.  Protect your wound from the sun when you are outside for the first 6 months, or for as long as told by your health care provider. Cover   up the scar area or apply sunscreen that has an SPF of at least 30.  Do not use any products that contain nicotine or tobacco, such as cigarettes, e-cigarettes, and chewing tobacco. These may delay wound healing. If you need help quitting, ask your health care provider.  Keep all follow-up visits as told by your health care provider. This is important. Contact a health care provider if:  You received a tetanus shot and you have swelling, severe pain, redness, or bleeding at the injection site.  Your pain is not controlled  with medicine.  You have any of these signs of infection: ? More redness, swelling, or pain around the wound. ? Fluid or blood coming from the wound. ? Warmth coming from the wound. ? Pus or a bad smell coming from the wound. ? A fever or chills.  You are nauseous or you vomit.  You are dizzy. Get help right away if:  You have a red streak of skin near the area around your wound.  Your wound has been closed with staples, sutures, skin glue, or adhesive strips and it begins to open up and separate.  Your wound is bleeding, and the bleeding does not stop with gentle pressure.  You have a rash.  You faint.  You have trouble breathing. These symptoms may represent a serious problem that is an emergency. Do not wait to see if the symptoms will go away. Get medical help right away. Call your local emergency services (911 in the [place] Do not drive yourself to the hospital. Summary  Always wash your hands with soap and water for at least 20 seconds before and after changing your dressing.  Change your dressing as told by your health care provider.  To help with healing, eat foods that are rich in protein, vitamin A, vitamin C, and other nutrients.  Check your wound every day for signs of infection. Contact your health care provider if you suspect that your wound is infected. This information is not intended to replace advice given to you by your health care provider. Make sure you discuss any questions you have with your health care provider. Document Revised: 04/04/2019 Document Reviewed: 04/04/2019 Elsevier Patient Education  2021 Elsevier Inc.  

## 2020-12-03 NOTE — Progress Notes (Signed)
Established Patient Office Visit  Subjective:  Patient ID: William Jefferson, male    DOB: January 14, 1946  Age: 75 y.o. MRN: [id number]  CC:  Chief Complaint  Patient presents with  . Abscess    HPI ATSUSHI YOM presents for The wound is cleansed, debrided of foreign material as much as possible, and dressed. The patient is alerted to watch for any signs of infection (redness, pus, pain, increased swelling or fever) and call if such occurs. Home wound care instructions are provided. Tetanus vaccination status reviewed: last tetanus booster within 10 years.  Past Medical History:  Diagnosis Date  . Bladder stone    removed in 2014 by Dr. Jeffie Pollock  . Cataract   . Coronary artery disease   . Hyperlipidemia    statin intolerance  . Hypertension     Past Surgical History:  Procedure Laterality Date  . CARDIAC CATHETERIZATION  06/10/1998   large P150 iliac stent to RCA - 3.5x64mm NIR stent (Dr. Marella Chimes)  . CARDIAC CATHETERIZATION  07/16/2007   mid-distal LAD stenosis, normal LV function, 2.5x44mm Promus stent to obtuse marginal, 2.5x44mm Promus stent to osital & proximal diagonal (Dr. Marella Chimes, Dr. Domenic Moras)  . MELANOMA EXCISION  2018  . NM MYOCAR PERF WALL MOTION  08/2008   bruce myoview; normal pattern of perfusion, EF 77%  . PROSTATE BIOPSY  2002  . TRANSTHORACIC ECHOCARDIOGRAM  08/2012   mild conc LVH    Family History  Problem Relation Age of Onset  . Cancer Mother        breast  . Emphysema Father   . Cirrhosis Brother   . Alcohol abuse Brother     Social History   Socioeconomic History  . Marital status: Married    Spouse name: Not on file  . Number of children: 3  . Years of education: 16  . Highest education level: Bachelor's degree (e.g., BA, AB, BS)  Occupational History    Employer: [employer]  Tobacco Use  . Smoking status: Former Smoker    Packs/day: 3.00    Years: 30.00    Pack years: 90.00    Quit date: 08/03/1997    Years since  quitting: 23.3  . Smokeless tobacco: Never Used  Vaping Use  . Vaping Use: Never used  Substance and Sexual Activity  . Alcohol use: Not Currently  . Drug use: Not Currently  . Sexual activity: Not Currently  Other Topics Concern  . Not on file  Social History Narrative  . Not on file   Social Determinants of Health   Financial Resource Strain: Not on file  Food Insecurity: Not on file  Transportation Needs: Not on file  Physical Activity: Not on file  Stress: Not on file  Social Connections: Not on file  Intimate Partner Violence: Not on file    Outpatient Medications Prior to Visit  Medication Sig Dispense Refill  . AMBULATORY NON FORMULARY MEDICATION Take 180 mg by mouth daily. Medication Name:bempedoic acid vs a placebo, CLEAR Research Study drug provided    . apixaban (ELIQUIS) 5 MG TABS tablet Take 1 tablet (5 mg total) by mouth 2 (two) times daily. 60 tablet 3  . aspirin EC 81 MG tablet Take 1 tablet (81 mg total) by mouth daily. Swallow whole. 90 tablet 3  . cephALEXin (KEFLEX) 500 MG capsule Take 500 mg by mouth daily.    Marland Kitchen lisinopril (ZESTRIL) 10 MG tablet Take 1 tablet (10 mg total) by mouth daily.  90 tablet 0  . Lutein 20 MG CAPS Take 1 capsule by mouth daily.    . metoprolol tartrate (LOPRESSOR) 25 MG tablet Take 1 tablet (25 mg total) by mouth 2 (two) times daily. 90 tablet 1  . Saw Palmetto 450 MG CAPS Take 2 capsules by mouth daily.     . vitamin C (ASCORBIC ACID) 500 MG tablet Take 500 mg by mouth daily.    . nitroGLYCERIN (NITROSTAT) 0.4 MG SL tablet Place 1 tablet (0.4 mg total) under the tongue every 5 (five) minutes as needed for chest pain. (Patient not taking: Reported on 06/16/2020) 25 tablet 3   No facility-administered medications prior to visit.    Allergies  Allergen Reactions  . Crestor [Rosuvastatin]   . Lipitor [Atorvastatin]   . Penicillins   . Sulfa Antibiotics   . Zetia [Ezetimibe]   . Zocor [Simvastatin]     ROS Review of Systems   Skin: Positive for color change and wound.  All other systems reviewed and are negative.     Objective:    Physical Exam Vitals and nursing note reviewed.  Constitutional:      Appearance: Normal appearance.  HENT:     Head: Normocephalic.     Nose: Nose normal.  Cardiovascular:     Rate and Rhythm: Normal rate and regular rhythm.     Pulses: Normal pulses.     Heart sounds: Normal heart sounds.  Pulmonary:     Effort: Pulmonary effort is normal.     Breath sounds: Normal breath sounds.  Abdominal:     General: Bowel sounds are normal.  Skin:    Findings: Abscess and erythema present.  Neurological:     Mental Status: He is alert and oriented to person, place, and time.     BP 117/75   Pulse 82   Temp (!) 97.1 F (36.2 C) (Temporal)   Ht 6\' 3"  (1.905 m)   Wt 211 lb (95.7 kg)   SpO2 98%   BMI 26.37 kg/m  Wt Readings from Last 3 Encounters:  12/03/20 211 lb (95.7 kg)  11/26/20 212 lb (96.2 kg)  11/25/20 211 lb 4 oz (95.8 kg)     Health Maintenance Due  Topic Date Due  . Pneumococcal Vaccine 14-62 Years old (1 of 2 - PPSV23) Never done  . Hepatitis C Screening  Never done  . COLONOSCOPY (Pts 45-55yrs Insurance coverage will need to be confirmed)  Never done  . Zoster Vaccines- Shingrix (1 of 2) Never done    There are no preventive care reminders to display for this patient.  Lab Results  Component Value Date   TSH 0.891 08/02/2020   Lab Results  Component Value Date   WBC 8.4 08/02/2020   HGB 16.4 08/02/2020   HCT 48.7 08/02/2020   MCV 94 08/02/2020   PLT 271 08/02/2020   Lab Results  Component Value Date   NA 142 08/02/2020   K 4.6 08/02/2020   CO2 22 08/02/2020   GLUCOSE 96 08/02/2020   BUN 16 08/02/2020   CREATININE 1.24 08/02/2020   BILITOT 0.4 06/03/2020   ALKPHOS 72 06/03/2020   AST 13 06/03/2020   ALT 19 06/03/2020   PROT 7.1 06/03/2020   ALBUMIN 4.5 06/03/2020   CALCIUM 9.8 08/02/2020   Lab Results  Component Value Date    CHOL 198 06/03/2020   Lab Results  Component Value Date   HDL 26 (L) 06/03/2020   Lab Results  Component Value Date  LDLCALC 131 (H) 06/03/2020   Lab Results  Component Value Date   TRIG 229 (H) 06/03/2020   Lab Results  Component Value Date   CHOLHDL 7.6 (H) 06/03/2020   No results found for: HGBA1C    Assessment & Plan:   Problem List Items Addressed This Visit      Other   Abscess - Primary    Is healing as expected.  Clean and packed, edges are intact.  Pictures taken and uploaded into my chart.  Wound is measuring about 1.5 x 1 as compared to prior measurements.  Education provided to patient with printed handouts given on home care and signs and symptoms of infection.  Follow-up in 1 week.         No orders of the defined types were placed in this encounter.   Follow-up: Return in about 1 week (around 12/10/2020).    [name], NP

## 2020-12-03 NOTE — Assessment & Plan Note (Signed)
Is healing as expected.  Clean and packed, edges are intact.  Pictures taken and uploaded into my chart.  Wound is measuring about 1.5 x 1 as compared to prior measurements.  Education provided to patient with printed handouts given on home care and signs and symptoms of infection.  Follow-up in 1 week.

## 2020-12-08 ENCOUNTER — Other Ambulatory Visit: Payer: PPO

## 2020-12-08 ENCOUNTER — Other Ambulatory Visit: Payer: Self-pay

## 2020-12-08 DIAGNOSIS — I2510 Atherosclerotic heart disease of native coronary artery without angina pectoris: Secondary | ICD-10-CM | POA: Diagnosis not present

## 2020-12-08 DIAGNOSIS — I4819 Other persistent atrial fibrillation: Secondary | ICD-10-CM | POA: Diagnosis not present

## 2020-12-09 ENCOUNTER — Other Ambulatory Visit: Payer: Self-pay | Admitting: Internal Medicine

## 2020-12-09 DIAGNOSIS — I4819 Other persistent atrial fibrillation: Secondary | ICD-10-CM

## 2020-12-09 LAB — CBC
Hematocrit: 52.5 % — ABNORMAL HIGH (ref 37.5–51.0)
Hemoglobin: 17.3 g/dL (ref 13.0–17.7)
MCH: 30.9 pg (ref 26.6–33.0)
MCHC: 33 g/dL (ref 31.5–35.7)
MCV: 94 fL (ref 79–97)
Platelets: 238 10*3/uL (ref 150–450)
RBC: 5.59 x10E6/uL (ref 4.14–5.80)
RDW: 13 % (ref 11.6–15.4)
WBC: 8.6 10*3/uL (ref 3.4–10.8)

## 2020-12-09 LAB — BASIC METABOLIC PANEL
BUN/Creatinine Ratio: 10 (ref 10–24)
BUN: 15 mg/dL (ref 8–27)
CO2: 21 mmol/L (ref 20–29)
Calcium: 10.2 mg/dL (ref 8.6–10.2)
Chloride: 102 mmol/L (ref 96–106)
Creatinine, Ser: 1.44 mg/dL — ABNORMAL HIGH (ref 0.76–1.27)
Glucose: 140 mg/dL — ABNORMAL HIGH (ref 65–99)
Potassium: 4.8 mmol/L (ref 3.5–5.2)
Sodium: 141 mmol/L (ref 134–144)
eGFR: 51 mL/min/{1.73_m2} — ABNORMAL LOW (ref >59)

## 2020-12-10 ENCOUNTER — Other Ambulatory Visit: Payer: Self-pay

## 2020-12-10 ENCOUNTER — Encounter: Payer: Self-pay | Admitting: Nurse Practitioner

## 2020-12-10 ENCOUNTER — Ambulatory Visit (INDEPENDENT_AMBULATORY_CARE_PROVIDER_SITE_OTHER): Payer: PPO | Admitting: Nurse Practitioner

## 2020-12-10 VITALS — BP 114/81 | HR 80 | Temp 97.3°F | Ht 75.0 in | Wt 211.0 lb

## 2020-12-10 DIAGNOSIS — L0291 Cutaneous abscess, unspecified: Secondary | ICD-10-CM | POA: Diagnosis not present

## 2020-12-10 NOTE — Assessment & Plan Note (Signed)
Wound healing as expected.  I did not pack wound today.  Follow-up in 1 week about 12/20/2020 for cleaning, dressing, at home wound care education information, antibiotic and follow-up as needed after .

## 2020-12-10 NOTE — Patient Instructions (Signed)
Skin Abscess  A skin abscess is an infected area of your skin that contains pus and other material. An abscess can happen in any part of your body. Some abscesses break open (rupture) on their own. Most continue to get worse unless they are treated. The infection can spread deeper into the body and into your blood, which can makeyou feel sick. A skin abscess is caused by germs that enter the skin through a cut or scrape. It can also be caused by blocked oil and sweat glands or infected hairfollicles. This condition is usually treated by: Draining the pus. Taking antibiotic medicines. Placing a warm, wet washcloth over the abscess. Follow these instructions at home: Medicines  Take over-the-counter and prescription medicines only as told by your doctor. If you were prescribed an antibiotic medicine, take it as told by your doctor. Do not stop taking the antibiotic even if you start to feel better.  Abscess care  If you have an abscess that has not drained, place a warm, clean, wet washcloth over the abscess several times a day. Do this as told by your doctor. Follow instructions from your doctor about how to take care of your abscess. Make sure you: Cover the abscess with a bandage (dressing). Change your bandage or gauze as told by your doctor. Wash your hands with soap and water before you change the bandage or gauze. If you cannot use soap and water, use hand sanitizer. Check your abscess every day for signs that the infection is getting worse. Check for: More redness, swelling, or pain. More fluid or blood. Warmth. More pus or a bad smell.  General instructions To avoid spreading the infection: Do not share personal care items, towels, or hot tubs with others. Avoid making skin-to-skin contact with other people. Keep all follow-up visits as told by your doctor. This is important. Contact a doctor if: You have more redness, swelling, or pain around your abscess. You have more  fluid or blood coming from your abscess. Your abscess feels warm when you touch it. You have more pus or a bad smell coming from your abscess. You have a fever. Your muscles ache. You have chills. You feel sick. Get help right away if: You have very bad (severe) pain. You see red streaks on your skin spreading away from the abscess. Summary A skin abscess is an infected area of your skin that contains pus and other material. The abscess is caused by germs that enter the skin through a cut or scrape. It can also be caused by blocked oil and sweat glands or infected hair follicles. Follow your doctor's instructions on caring for your abscess, taking medicines, preventing infections, and keeping follow-up visits. This information is not intended to replace advice given to you by your health care provider. Make sure you discuss any questions you have with your healthcare provider. Document Revised: 01/23/2019 Document Reviewed: 08/02/2017 Elsevier Patient Education  2022 [employer].

## 2020-12-10 NOTE — Progress Notes (Signed)
Acute Office Visit  Subjective:    Patient ID: William Jefferson, male    DOB: 1946-06-17, 75 y.o.   MRN: [id number]  Chief Complaint  Patient presents with   Abscess    Abscess  Patient is in today for Wound Check: Patient presents for wound check. Patient has a open abscess wound which is located on the back. Current symptoms: wound healing as expected. Symptoms began a few months ago. Pain is rated 3/10. Interventions to date: dressing changed a few days ago.   Past Medical History:  Diagnosis Date   Bladder stone    removed in 2014 by Dr. Jeffie Pollock   Cataract    Coronary artery disease    Hyperlipidemia    statin intolerance   Hypertension     Past Surgical History:  Procedure Laterality Date   CARDIAC CATHETERIZATION  06/10/1998   large P150 iliac stent to RCA - 3.5x22m NIR stent (Dr. RMarella Chimes   CARDIAC CATHETERIZATION  07/16/2007   mid-distal LAD stenosis, normal LV function, 2.5x174mPromus stent to obtuse marginal, 2.5x1877mromus stent to osital & proximal diagonal (Dr. R. Marella Chimesr. W. Domenic Moras [place]   NM MYOCAR [place]/2010   bruce myoview; normal pattern of perfusion, EF 77%   PROSTATE BIOPSY  2002   TRANSTHORACIC ECHOCARDIOGRAM  08/2012   mild conc LVH    Family History  Problem Relation Age of Onset   Cancer Mother        breast   Emphysema Father    Cirrhosis Brother    Alcohol abuse Brother     Social History   Socioeconomic History   Marital status: Married    Spouse name: Not on file   Number of children: 3   Years of education: 16   Highest education level: Bachelor's degree (e.g., BA, AB, BS)  Occupational History    Employer: [employer]  Tobacco Use   Smoking status: Former    Packs/day: 3.00    Years: 30.00    Pack years: 90.00    Types: Cigarettes    Quit date: 08/03/1997    Years since quitting: 23.3   Smokeless tobacco: Never  Vaping Use   Vaping Use: Never used  Substance and Sexual  Activity   Alcohol use: Not Currently   Drug use: Not Currently   Sexual activity: Not Currently  Other Topics Concern   Not on file  Social History Narrative   Not on file   Social Determinants of Health   Financial Resource Strain: Not on file  Food Insecurity: Not on file  Transportation Needs: Not on file  Physical Activity: Not on file  Stress: Not on file  Social Connections: Not on file  Intimate Partner Violence: Not on file    Outpatient Medications Prior to Visit  Medication Sig Dispense Refill   AMBULATORY NON FORMULARY MEDICATION Take 180 mg by mouth daily. Medication Name:bempedoic acid vs a placebo, CLEAR Research Study drug provided     apixaban (ELIQUIS) 5 MG TABS tablet Take 1 tablet (5 mg total) by mouth 2 (two) times daily. 60 tablet 3   aspirin EC 81 MG tablet Take 1 tablet (81 mg total) by mouth daily. Swallow whole. 90 tablet 3   cephALEXin (KEFLEX) 500 MG capsule Take 500 mg by mouth daily.     lisinopril (ZESTRIL) 10 MG tablet Take 1 tablet (10 mg total) by mouth daily. 90 tablet 0  Lutein 20 MG CAPS Take 1 capsule by mouth daily.     metoprolol tartrate (LOPRESSOR) 25 MG tablet Take 1 tablet (25 mg total) by mouth 2 (two) times daily. 90 tablet 1   Saw Palmetto 450 MG CAPS Take 2 capsules by mouth daily.      vitamin C (ASCORBIC ACID) 500 MG tablet Take 500 mg by mouth daily.     nitroGLYCERIN (NITROSTAT) 0.4 MG SL tablet Place 1 tablet (0.4 mg total) under the tongue every 5 (five) minutes as needed for chest pain. (Patient not taking: Reported on 06/16/2020) 25 tablet 3   No facility-administered medications prior to visit.    Allergies  Allergen Reactions   Crestor [Rosuvastatin]    Lipitor [Atorvastatin]    Penicillins    Sulfa Antibiotics    Zetia [Ezetimibe]    Zocor [Simvastatin]     Review of Systems  Constitutional: Negative.   HENT: Negative.    Respiratory: Negative.    Gastrointestinal: Negative.   Skin:  Positive for color  change and wound.  All other systems reviewed and are negative.     Objective:    Physical Exam Vitals and nursing note reviewed.  HENT:     Head: Normocephalic.     Nose: Nose normal.  Eyes:     Conjunctiva/sclera: Conjunctivae normal.  Cardiovascular:     Rate and Rhythm: Normal rate and regular rhythm.     Pulses: Normal pulses.     Heart sounds: Normal heart sounds.  Pulmonary:     Effort: Pulmonary effort is normal.     Breath sounds: Normal breath sounds.  Abdominal:     General: Bowel sounds are normal.  Skin:    Findings: Abscess and erythema present. No rash.    BP 114/81   Pulse 80   Temp (!) 97.3 F (36.3 C) (Temporal)   Ht 6' 3"  (1.905 m)   Wt 211 lb (95.7 kg)   SpO2 96%   BMI 26.37 kg/m  Wt Readings from Last 3 Encounters:  12/10/20 211 lb (95.7 kg)  12/03/20 211 lb (95.7 kg)  11/26/20 212 lb (96.2 kg)    Health Maintenance Due  Topic Date Due   Hepatitis C Screening  Never done   COLONOSCOPY (Pts 45-37yr Insurance coverage will need to be confirmed)  Never done   Zoster Vaccines- Shingrix (1 of 2) Never done   COVID-19 Vaccine (4 - Booster for [place]) 08/17/2020    There are no preventive care reminders to display for this patient.   Lab Results  Component Value Date   TSH 0.891 08/02/2020   Lab Results  Component Value Date   WBC 8.6 12/08/2020   HGB 17.3 12/08/2020   HCT 52.5 (H) 12/08/2020   MCV 94 12/08/2020   PLT 238 12/08/2020   Lab Results  Component Value Date   NA 141 12/08/2020   K 4.8 12/08/2020   CO2 21 12/08/2020   GLUCOSE 140 (H) 12/08/2020   BUN 15 12/08/2020   CREATININE 1.44 (H) 12/08/2020   BILITOT 0.4 06/03/2020   ALKPHOS 72 06/03/2020   AST 13 06/03/2020   ALT 19 06/03/2020   PROT 7.1 06/03/2020   ALBUMIN 4.5 06/03/2020   CALCIUM 10.2 12/08/2020   EGFR 51 (L) 12/08/2020   Lab Results  Component Value Date   CHOL 198 06/03/2020   Lab Results  Component Value Date   HDL 26 (L) 06/03/2020    Lab Results  Component Value Date  LDLCALC 131 (H) 06/03/2020   Lab Results  Component Value Date   TRIG 229 (H) 06/03/2020   Lab Results  Component Value Date   CHOLHDL 7.6 (H) 06/03/2020   No results found for: HGBA1C     Assessment & Plan:   Problem List Items Addressed This Visit       Other   Abscess - Primary    Wound healing as expected.  I did not pack wound today.  Follow-up in 1 week about 12/20/2020 for cleaning, dressing, at home wound care education information, antibiotic and follow-up as needed after .         No orders of the defined types were placed in this encounter.    [name], NP

## 2020-12-13 ENCOUNTER — Ambulatory Visit (INDEPENDENT_AMBULATORY_CARE_PROVIDER_SITE_OTHER): Payer: PPO

## 2020-12-13 VITALS — Wt 211.0 lb

## 2020-12-13 DIAGNOSIS — Z0000 Encounter for general adult medical examination without abnormal findings: Secondary | ICD-10-CM

## 2020-12-13 NOTE — Progress Notes (Signed)
Subjective:   William Jefferson is a 75 y.o. male who presents for an Initial Medicare Annual Wellness Visit.  Virtual Visit via Telephone Note  I connected with  William Jefferson on 12/13/20 at 11:15 AM EDT by telephone and verified that I am speaking with the correct person using two identifiers.  Location: Patient: Home Provider: [facility] Persons participating in the virtual visit: patient/Nurse Health Advisor   I discussed the limitations, risks, security and privacy concerns of performing an evaluation and management service by telephone and the availability of in person appointments. The patient expressed understanding and agreed to proceed.  Interactive audio and video telecommunications were attempted between this nurse and patient, however failed, due to patient having technical difficulties OR patient did not have access to video capability.  We continued and completed visit with audio only.  Some vital signs may be absent or patient reported.   William Jefferson E Lawan Nanez, LPN   Review of Systems     Cardiac Risk Factors include: advanced age (>34men, >73 women);dyslipidemia;hypertension;male gender;Other (see comment), Risk factor comments: CAD     Objective:    Today's Vitals   12/13/20 1127 12/13/20 1128  Weight: 211 lb (95.7 kg)   PainSc:  3    Body mass index is 26.37 kg/m.  Advanced Directives 12/13/2020  Does Patient Have a Medical Advance Directive? No  Would patient like information on creating a medical advance directive? No - Patient declined    Current Medications (verified) Outpatient Encounter Medications as of 12/13/2020  Medication Sig   AMBULATORY NON FORMULARY MEDICATION Take 180 mg by mouth daily. Medication Name:bempedoic acid vs a placebo, CLEAR Research Study drug provided   apixaban (ELIQUIS) 5 MG TABS tablet Take 1 tablet (5 mg total) by mouth 2 (two) times daily.   aspirin EC 81 MG tablet Take 1 tablet (81 mg total) by mouth daily. Swallow whole.    lisinopril (ZESTRIL) 10 MG tablet Take 1 tablet (10 mg total) by mouth daily.   Lutein 20 MG CAPS Take 20 mg by mouth daily.   metoprolol tartrate (LOPRESSOR) 25 MG tablet Take 1 tablet (25 mg total) by mouth 2 (two) times daily.   nitroGLYCERIN (NITROSTAT) 0.4 MG SL tablet Place 1 tablet (0.4 mg total) under the tongue every 5 (five) minutes as needed for chest pain.   Saw Palmetto 450 MG CAPS Take 900 mg by mouth daily.   vitamin C (ASCORBIC ACID) 500 MG tablet Take 500 mg by mouth daily.   cephALEXin (KEFLEX) 500 MG capsule Take 500 mg by mouth daily. (Patient not taking: Reported on 12/13/2020)   No facility-administered encounter medications on file as of 12/13/2020.    Allergies (verified) Crestor [rosuvastatin], Lipitor [atorvastatin], Penicillins, Sulfa antibiotics, Zetia [ezetimibe], and Zocor [simvastatin]   History: Past Medical History:  Diagnosis Date   Bladder stone    removed in 2014 by Dr. Jeffie Pollock   Cataract    Coronary artery disease    Hyperlipidemia    statin intolerance   Hypertension    Past Surgical History:  Procedure Laterality Date   CARDIAC CATHETERIZATION  06/10/1998   large P150 iliac stent to RCA - 3.5x24mm NIR stent (Dr. Marella Chimes)   CARDIAC CATHETERIZATION  07/16/2007   mid-distal LAD stenosis, normal LV function, 2.5x44mm Promus stent to obtuse marginal, 2.5x25mm Promus stent to osital & proximal diagonal (Dr. Marella Chimes, Dr. Domenic Moras)   [place]  2018   [place]  08/2008  bruce myoview; normal pattern of perfusion, EF 77%   PROSTATE BIOPSY  2002   TRANSTHORACIC ECHOCARDIOGRAM  08/2012   mild conc LVH   Family History  Problem Relation Age of Onset   Cancer Mother        breast   Emphysema Father    Cirrhosis Brother    Alcohol abuse Brother    Social History   Socioeconomic History   Marital status: Married    Spouse name: Not on file   Number of children: 3   Years of education: 16   Highest education  level: Bachelor's degree (e.g., BA, AB, BS)  Occupational History    Employer: [employer]  Tobacco Use   Smoking status: Former    Packs/day: 3.00    Years: 30.00    Pack years: 90.00    Types: Cigarettes    Quit date: 08/03/1997    Years since quitting: 23.3   Smokeless tobacco: Never  Vaping Use   Vaping Use: Never used  Substance and Sexual Activity   Alcohol use: Not Currently   Drug use: Not Currently   Sexual activity: Not Currently  Other Topics Concern   Not on file  Social History Narrative   Lives home with wife, family nearby   Social Determinants of Health   Financial Resource Strain: Low Risk    Difficulty of Paying Living Expenses: Not hard at all  Food Insecurity: No Food Insecurity   Worried About Charity fundraiser in the Last Year: Never true   Arboriculturist in the Last Year: Never true  Transportation Needs: No Transportation Needs   Lack of Transportation (Medical): No   Lack of Transportation (Non-Medical): No  Physical Activity: Sufficiently Active   Days of Exercise per Week: 7 days   Minutes of Exercise per Session: 30 min  Stress: No Stress Concern Present   Feeling of Stress : Not at all  Social Connections: Socially Integrated   Frequency of Communication with Friends and Family: More than three times a week   Frequency of Social Gatherings with Friends and Family: More than three times a week   Attends Religious Services: More than 4 times per year   Active Member of [employer] or Organizations: Yes   Attends Music therapist: More than 4 times per year   Marital Status: Married    Tobacco Counseling Counseling given: Not Answered   Clinical Intake:  Pre-visit preparation completed: Yes  Pain : 0-10 Pain Score: 3  Pain Type: Chronic pain Pain Location: Knee Pain Orientation: Right, Left Pain Descriptors / Indicators: Aching, Sore Pain Onset: More than a month ago Pain Frequency: Intermittent     BMI - recorded:  26.37 Nutritional Status: BMI 25 -29 Overweight Nutritional Risks: None Diabetes: No  How often do you need to have someone help you when you read instructions, pamphlets, or other written materials from your doctor or pharmacy?: 1 - Never  Diabetic?No  Interpreter Needed?: No  Information entered by :: William Beske, LPN   Activities of Daily Living In your present state of health, do you have any difficulty performing the following activities: 12/13/2020  Hearing? Y  Vision? N  Difficulty concentrating or making decisions? N  Walking or climbing stairs? N  Dressing or bathing? N  Doing errands, shopping? N  Preparing Food and eating ? N  Using the Toilet? N  In the past six months, have you accidently leaked urine? N  Do you  have problems with loss of bowel control? N  Managing your Medications? N  Managing your Finances? N  Housekeeping or managing your Housekeeping? N  Some recent data might be hidden    Patient Care Team: [name], NP as PCP - General (Nurse Practitioner) Pixie Casino, MD as PCP - Cardiology (Cardiology)  Indicate any recent Medical Services you may have received from other than [facility] providers in the past year (date may be approximate).     Assessment:   This is a routine wellness examination for William Jefferson.  Hearing/Vision screen Hearing Screening - Comments:: C/o mild to moderate hearing loss - declines hearing aids Vision Screening - Comments:: Wears eyeglasses - behind on annual eye exam   Dietary issues and exercise activities discussed: Current Exercise Habits: Home exercise routine, Type of exercise: walking, Time (Minutes): 30, Frequency (Times/Week): 7, Weekly Exercise (Minutes/Week): 210, Intensity: Mild, Exercise limited by: orthopedic condition(s)   Goals Addressed             This Visit's Progress    Exercise 3x per week (30 min per time)         Depression Screen PHQ 2/9 Scores 12/13/2020 12/10/2020 12/03/2020  11/26/2020 11/19/2020 11/17/2020 11/12/2020  PHQ - 2 Score 0 0 0 0 0 0 0  PHQ- 9 Score 0 0 0 0 0 0 0    Fall Risk Fall Risk  12/13/2020 12/10/2020 12/03/2020 11/26/2020 11/19/2020  Falls in the past year? 1 1 0 0 0  Number falls in past yr: 0 0 - - -  Injury with Fall? 1 0 - - -  Risk for fall due to : History of fall(s);Impaired balance/gait;Impaired vision;Orthopedic patient - - - -  Follow up Education provided;Falls prevention discussed - - - -    FALL RISK PREVENTION PERTAINING TO THE HOME:  Any stairs in or around the home? Yes  If so, are there any without handrails? No  Home free of loose throw rugs in walkways, pet beds, electrical cords, etc? Yes  Adequate lighting in your home to reduce risk of falls? Yes   ASSISTIVE DEVICES UTILIZED TO PREVENT FALLS:  Life alert? No  Use of a cane, walker or w/c? Yes  Grab bars in the bathroom? Yes  Shower chair or bench in shower? No  Elevated toilet seat or a handicapped toilet? No   TIMED UP AND GO:  Was the test performed? No . Telephonic visit.  Cognitive Function:     6CIT Screen 12/13/2020  What Year? 0 points  What month? 0 points  What time? 0 points  Count back from 20 0 points  Months in reverse 0 points  Repeat phrase 0 points  Total Score 0    Immunizations Immunization History  Administered Date(s) Administered   PFIZER(Purple Top)SARS-COV-2 Vaccination 07/29/2019, 08/19/2019, 04/16/2020   Td 06/02/1998   Tdap 09/17/2020   Zoster, Live 03/31/2016    TDAP status: Up to date  Flu Vaccine status: Declined, Education has been provided regarding the importance of this vaccine but patient still declined. Advised may receive this vaccine at local pharmacy or Health Dept. Aware to provide a copy of the vaccination record if obtained from local pharmacy or Health Dept. Verbalized acceptance and understanding.  Pneumococcal vaccine status: Due, Education has been provided regarding the importance of this vaccine. Advised  may receive this vaccine at local pharmacy or Health Dept. Aware to provide a copy of the vaccination record if obtained from local pharmacy or Health  Dept. Verbalized acceptance and understanding.  Covid-19 vaccine status: Completed vaccines  Qualifies for Shingles Vaccine? Yes   Zostavax completed Yes   Shingrix Completed?: No.    Education has been provided regarding the importance of this vaccine. Patient has been advised to call insurance company to determine out of pocket expense if they have not yet received this vaccine. Advised may also receive vaccine at local pharmacy or Health Dept. Verbalized acceptance and understanding.  Screening Tests Health Maintenance  Topic Date Due   Hepatitis C Screening  Never done   COLONOSCOPY (Pts 45-12yrs Insurance coverage will need to be confirmed)  Never done   Zoster Vaccines- Shingrix (1 of 2) Never done   COVID-19 Vaccine (4 - Booster for Pfizer series) 08/17/2020   PNA vac Low Risk Adult (1 of 2 - PCV13) 06/03/2021 (Originally 04/30/2011)   INFLUENZA VACCINE  01/31/2021   TETANUS/TDAP  09/18/2030   HPV VACCINES  Aged Out    Health Maintenance  Health Maintenance Due  Topic Date Due   Hepatitis C Screening  Never done   COLONOSCOPY (Pts 45-59yrs Insurance coverage will need to be confirmed)  Never done   Zoster Vaccines- Shingrix (1 of 2) Never done   COVID-19 Vaccine (4 - Booster for Clinch series) 08/17/2020    Colorectal cancer screening: No longer required. PATIENT DECLINED  Lung Cancer Screening: (Low Dose CT Chest recommended if Age 57-80 years, 30 pack-year currently smoking OR have quit w/in 15years.) does not qualify  Additional Screening:  Hepatitis C Screening: does qualify; Needs this drawn at next visit.  Vision Screening: Recommended annual ophthalmology exams for early detection of glaucoma and other disorders of the eye. Is the patient up to date with their annual eye exam?  No  Who is the provider or what is  the name of the office in which the patient attends annual eye exams? N/a If pt is not established with a provider, would they like to be referred to a provider to establish care? No .   Dental Screening: Recommended annual dental exams for proper oral hygiene  Community Resource Referral / Chronic Care Management: CRR required this visit?  No   CCM required this visit?  No      Plan:     I have personally reviewed and noted the following in the patient's chart:   Medical and social history Use of alcohol, tobacco or illicit drugs  Current medications and supplements including opioid prescriptions. Patient is not currently taking opioid prescriptions. Functional ability and status Nutritional status Physical activity Advanced directives List of other physicians Hospitalizations, surgeries, and ER visits in previous 12 months Vitals Screenings to include cognitive, depression, and falls Referrals and appointments  In addition, I have reviewed and discussed with patient certain preventive protocols, quality metrics, and best practice recommendations. A written personalized care plan for preventive services as well as general preventive health recommendations were provided to patient.     Sandrea Hammond, LPN   [id number]   Nurse Notes: None

## 2020-12-13 NOTE — Patient Instructions (Signed)
William Jefferson , Thank you for taking time to come for your Medicare Wellness Visit. I appreciate your ongoing commitment to your health goals. Please review the following plan we discussed and let me know if I can assist you in the future.   Screening recommendations/referrals: Colonoscopy: Patient declined - consider Cologuard Recommended yearly ophthalmology/optometry visit for glaucoma screening and checkup Recommended yearly dental visit for hygiene and checkup  Vaccinations: Influenza vaccine: Declined Pneumococcal vaccine: Due (2 vaccines 1 year apart) Tdap vaccine: Done 09/17/2020 - Repeat in 10 years Shingles vaccine: Zostavax done 2017; due for Shingrix (2 doses 2-6 months apart) Shingrix discussed. Please contact your pharmacy for coverage information.    Covid-19: Done 07/29/19, 08/19/19, & 04/16/20  Advanced directives: Please bring a copy of your health care power of attorney and living will to the office to be added to your chart at your convenience.  Conditions/risks identified: Aim for 30 minutes of exercise or brisk walking each day, drink 6-8 glasses of water and eat lots of fruits and vegetables.  Next appointment: Follow up in one year for your annual wellness visit.   Preventive Care 9 Years and Older, Male  Preventive care refers to lifestyle choices and visits with your health care provider that can promote health and wellness. What does preventive care include? A yearly physical exam. This is also called an annual well check. Dental exams once or twice a year. Routine eye exams. Ask your health care provider how often you should have your eyes checked. Personal lifestyle choices, including: Daily care of your teeth and gums. Regular physical activity. Eating a healthy diet. Avoiding tobacco and drug use. Limiting alcohol use. Practicing safe sex. Taking low doses of aspirin every day. Taking vitamin and mineral supplements as recommended by your health care  provider. What happens during an annual well check? The services and screenings done by your health care provider during your annual well check will depend on your age, overall health, lifestyle risk factors, and family history of disease. Counseling  Your health care provider may ask you questions about your: Alcohol use. Tobacco use. Drug use. Emotional well-being. Home and relationship well-being. Sexual activity. Eating habits. History of falls. Memory and ability to understand (cognition). Work and work Statistician. Screening  You may have the following tests or measurements: Height, weight, and BMI. Blood pressure. Lipid and cholesterol levels. These may be checked every 5 years, or more frequently if you are over 4 years old. Skin check. Lung cancer screening. You may have this screening every year starting at age 57 if you have a 30-pack-year history of smoking and currently smoke or have quit within the past 15 years. Fecal occult blood test (FOBT) of the stool. You may have this test every year starting at age 12. Flexible sigmoidoscopy or colonoscopy. You may have a sigmoidoscopy every 5 years or a colonoscopy every 10 years starting at age 70. Prostate cancer screening. Recommendations will vary depending on your family history and other risks. Hepatitis C blood test. Hepatitis B blood test. Sexually transmitted disease (STD) testing. Diabetes screening. This is done by checking your blood sugar (glucose) after you have not eaten for a while (fasting). You may have this done every 1-3 years. Abdominal aortic aneurysm (AAA) screening. You may need this if you are a current or former smoker. Osteoporosis. You may be screened starting at age 36 if you are at high risk. Talk with your health care provider about your test results, treatment options, and  if necessary, the need for more tests. Vaccines  Your health care provider may recommend certain vaccines, such  as: Influenza vaccine. This is recommended every year. Tetanus, diphtheria, and acellular pertussis (Tdap, Td) vaccine. You may need a Td booster every 10 years. Zoster vaccine. You may need this after age 8. Pneumococcal 13-valent conjugate (PCV13) vaccine. One dose is recommended after age 25. Pneumococcal polysaccharide (PPSV23) vaccine. One dose is recommended after age 4. Talk to your health care provider about which screenings and vaccines you need and how often you need them. This information is not intended to replace advice given to you by your health care provider. Make sure you discuss any questions you have with your health care provider. Document Released: 07/16/2015 Document Revised: 03/08/2016 Document Reviewed: 04/20/2015 Elsevier Interactive Patient Education  2017 [employer] Prevention in the Home Falls can cause injuries. They can happen to people of all ages. There are many things you can do to make your home safe and to help prevent falls. What can I do on the outside of my home? Regularly fix the edges of walkways and driveways and fix any cracks. Remove anything that might make you trip as you walk through a door, such as a raised step or threshold. Trim any bushes or trees on the path to your home. Use bright outdoor lighting. Clear any walking paths of anything that might make someone trip, such as rocks or tools. Regularly check to see if handrails are loose or broken. Make sure that both sides of any steps have handrails. Any raised decks and porches should have guardrails on the edges. Have any leaves, snow, or ice cleared regularly. Use sand or salt on walking paths during winter. Clean up any spills in your garage right away. This includes oil or grease spills. What can I do in the bathroom? Use night lights. Install grab bars by the toilet and in the tub and shower. Do not use towel bars as grab bars. Use non-skid mats or decals in the tub or  shower. If you need to sit down in the shower, use a plastic, non-slip stool. Keep the floor dry. Clean up any water that spills on the floor as soon as it happens. Remove soap buildup in the tub or shower regularly. Attach bath mats securely with double-sided non-slip rug tape. Do not have throw rugs and other things on the floor that can make you trip. What can I do in the bedroom? Use night lights. Make sure that you have a light by your bed that is easy to reach. Do not use any sheets or blankets that are too big for your bed. They should not hang down onto the floor. Have a firm chair that has side arms. You can use this for support while you get dressed. Do not have throw rugs and other things on the floor that can make you trip. What can I do in the kitchen? Clean up any spills right away. Avoid walking on wet floors. Keep items that you use a lot in easy-to-reach places. If you need to reach something above you, use a strong step stool that has a grab bar. Keep electrical cords out of the way. Do not use floor polish or wax that makes floors slippery. If you must use wax, use non-skid floor wax. Do not have throw rugs and other things on the floor that can make you trip. What can I do with my stairs? Do not leave any  items on the stairs. Make sure that there are handrails on both sides of the stairs and use them. Fix handrails that are broken or loose. Make sure that handrails are as long as the stairways. Check any carpeting to make sure that it is firmly attached to the stairs. Fix any carpet that is loose or worn. Avoid having throw rugs at the top or bottom of the stairs. If you do have throw rugs, attach them to the floor with carpet tape. Make sure that you have a light switch at the top of the stairs and the bottom of the stairs. If you do not have them, ask someone to add them for you. What else can I do to help prevent falls? Wear shoes that: Do not have high heels. Have  rubber bottoms. Are comfortable and fit you well. Are closed at the toe. Do not wear sandals. If you use a stepladder: Make sure that it is fully opened. Do not climb a closed stepladder. Make sure that both sides of the stepladder are locked into place. Ask someone to hold it for you, if possible. Clearly mark and make sure that you can see: Any grab bars or handrails. First and last steps. Where the edge of each step is. Use tools that help you move around (mobility aids) if they are needed. These include: Canes. Walkers. Scooters. Crutches. Turn on the lights when you go into a dark area. Replace any light bulbs as soon as they burn out. Set up your furniture so you have a clear path. Avoid moving your furniture around. If any of your floors are uneven, fix them. If there are any pets around you, be aware of where they are. Review your medicines with your doctor. Some medicines can make you feel dizzy. This can increase your chance of falling. Ask your doctor what other things that you can do to help prevent falls. This information is not intended to replace advice given to you by your health care provider. Make sure you discuss any questions you have with your health care provider. Document Released: 04/15/2009 Document Revised: 11/25/2015 Document Reviewed: 07/24/2014 Elsevier Interactive Patient Education  2017 [employer].

## 2020-12-14 ENCOUNTER — Other Ambulatory Visit: Payer: Self-pay | Admitting: Cardiovascular Disease

## 2020-12-14 NOTE — Telephone Encounter (Signed)
Prescription refill request for Eliquis received. Indication:atrial fib Last office visit:5/22 Scr:1.4 Age: 75 Weight:95.7 kg  Prescription refilled

## 2020-12-15 ENCOUNTER — Other Ambulatory Visit: Payer: Self-pay

## 2020-12-15 ENCOUNTER — Encounter: Payer: PPO | Admitting: *Deleted

## 2020-12-15 ENCOUNTER — Other Ambulatory Visit (HOSPITAL_COMMUNITY): Payer: PPO

## 2020-12-15 DIAGNOSIS — Z006 Encounter for examination for normal comparison and control in clinical research program: Secondary | ICD-10-CM

## 2020-12-15 NOTE — Research (Signed)
I saw patient for end of study visit for Clear Study. Patient returned study medications and 100% compliance. Adverse events and concomitant medications assessed and reconciled. I will call patient in 30 days post -study phone call.

## 2020-12-17 ENCOUNTER — Ambulatory Visit (HOSPITAL_COMMUNITY)
Admission: RE | Admit: 2020-12-17 | Discharge: 2020-12-17 | Disposition: A | Discharge status: Home / Self Care | Payer: PPO | Attending: Internal Medicine | Admitting: Internal Medicine

## 2020-12-17 ENCOUNTER — Encounter (HOSPITAL_COMMUNITY)
Admission: RE | Disposition: A | Discharge status: Home / Self Care | Payer: Self-pay | Source: Home / Self Care | Attending: Internal Medicine

## 2020-12-17 ENCOUNTER — Ambulatory Visit (HOSPITAL_COMMUNITY): Payer: PPO | Admitting: Anesthesiology

## 2020-12-17 ENCOUNTER — Encounter (HOSPITAL_COMMUNITY): Payer: Self-pay | Admitting: Internal Medicine

## 2020-12-17 ENCOUNTER — Other Ambulatory Visit: Payer: Self-pay

## 2020-12-17 DIAGNOSIS — Z7982 Long term (current) use of aspirin: Secondary | ICD-10-CM | POA: Diagnosis not present

## 2020-12-17 DIAGNOSIS — Z955 Presence of coronary angioplasty implant and graft: Secondary | ICD-10-CM | POA: Insufficient documentation

## 2020-12-17 DIAGNOSIS — Z87891 Personal history of nicotine dependence: Secondary | ICD-10-CM | POA: Insufficient documentation

## 2020-12-17 DIAGNOSIS — I10 Essential (primary) hypertension: Secondary | ICD-10-CM | POA: Insufficient documentation

## 2020-12-17 DIAGNOSIS — Z880 Allergy status to penicillin: Secondary | ICD-10-CM | POA: Diagnosis not present

## 2020-12-17 DIAGNOSIS — I2510 Atherosclerotic heart disease of native coronary artery without angina pectoris: Secondary | ICD-10-CM | POA: Insufficient documentation

## 2020-12-17 DIAGNOSIS — Z882 Allergy status to sulfonamides status: Secondary | ICD-10-CM | POA: Insufficient documentation

## 2020-12-17 DIAGNOSIS — I4891 Unspecified atrial fibrillation: Secondary | ICD-10-CM | POA: Diagnosis not present

## 2020-12-17 DIAGNOSIS — E785 Hyperlipidemia, unspecified: Secondary | ICD-10-CM | POA: Insufficient documentation

## 2020-12-17 DIAGNOSIS — Z79899 Other long term (current) drug therapy: Secondary | ICD-10-CM | POA: Diagnosis not present

## 2020-12-17 DIAGNOSIS — E782 Mixed hyperlipidemia: Secondary | ICD-10-CM | POA: Diagnosis not present

## 2020-12-17 DIAGNOSIS — I4819 Other persistent atrial fibrillation: Secondary | ICD-10-CM | POA: Insufficient documentation

## 2020-12-17 HISTORY — PX: CARDIOVERSION: SHX1299

## 2020-12-17 SURGERY — CARDIOVERSION
Anesthesia: General

## 2020-12-17 MED ORDER — LIDOCAINE 2% (20 MG/ML) 5 ML SYRINGE
INTRAMUSCULAR | Status: DC | PRN
  Administered 2020-12-17: 80 mg via INTRAVENOUS

## 2020-12-17 MED ORDER — PROPOFOL 10 MG/ML IV BOLUS
INTRAVENOUS | Status: DC | PRN
  Administered 2020-12-17: 60 mg via INTRAVENOUS

## 2020-12-17 MED ORDER — SODIUM CHLORIDE 0.9 % IV SOLN: INTRAVENOUS | Status: DC

## 2020-12-17 NOTE — H&P (Signed)
ADMISSION HISTORY & PHYSICAL  Patient Name: William Jefferson Date of Encounter: 12/17/2020 Primary Care Physician: [name], NP Cardiologist: Pixie Casino, MD  Chief Complaint   Afib  Patient Profile   75 yo male with persistent atrial fibrillation, scheduled for cardioversion  HPI   This is a 75 y.o. male with a past medical history significant for hypertension, dyslipidemia, PAF and CAD with prior PCI. He has been persistently in afib since I saw him recently in May. He was seen in January by my partner Dr. Audie Box for an acute add-on for preoperative visit.  He was found to be in rate controlled A. fib at the time.  He was appropriately switched to Eliquis and an echocardiogram was performed.  This showed normal systolic function with no evidence of atrial enlargement.  He was supposed to have a planned right knee surgery ultimately that was not performed.  He returns today for follow-up.  His EKG shows he is in persistent A. fib with frequent PVCs in a quadrigeminal pattern.  He reports he has missed several doses of Eliquis over the past several months.  We discussed options including continuing his rate control or a possibility of an elective cardioversion.  Although he says he denies any chest pain or fatigue, it was unclear to him how long he may have been in A. fib.   PMHx   Past Medical History:  Diagnosis Date   Bladder stone    removed in 2014 by Dr. Jeffie Pollock   Cataract    Coronary artery disease    Hyperlipidemia    statin intolerance   Hypertension     Past Surgical History:  Procedure Laterality Date   CARDIAC CATHETERIZATION  06/10/1998   large P150 iliac stent to RCA - 3.5x41mm NIR stent (Dr. Marella Chimes)   CARDIAC CATHETERIZATION  07/16/2007   mid-distal LAD stenosis, normal LV function, 2.5x75mm Promus stent to obtuse marginal, 2.5x55mm Promus stent to osital & proximal diagonal (Dr. Marella Chimes, Dr. Domenic Moras)   [place]  2018   NM  MYOCAR [place]  08/2008   bruce myoview; normal pattern of perfusion, EF 77%   PROSTATE BIOPSY  2002   TRANSTHORACIC ECHOCARDIOGRAM  08/2012   mild conc LVH    FAMHx   Family History  Problem Relation Age of Onset   Cancer Mother        breast   Emphysema Father    Cirrhosis Brother    Alcohol abuse Brother     SOCHx    reports that he quit smoking about 23 years ago. His smoking use included cigarettes. He has a 90.00 pack-year smoking history. He has never used smokeless tobacco. He reports previous alcohol use. He reports previous drug use.  Outpatient Medications   No current facility-administered medications on file prior to encounter.   Current Outpatient Medications on File Prior to Encounter  Medication Sig Dispense Refill   AMBULATORY NON FORMULARY MEDICATION Take 180 mg by mouth daily. Medication Name:bempedoic acid vs a placebo, CLEAR Research Study drug provided     aspirin EC 81 MG tablet Take 1 tablet (81 mg total) by mouth daily. Swallow whole. 90 tablet 3   lisinopril (ZESTRIL) 10 MG tablet Take 1 tablet (10 mg total) by mouth daily. 90 tablet 0   Lutein 20 MG CAPS Take 20 mg by mouth daily.     metoprolol tartrate (LOPRESSOR) 25 MG tablet Take 1 tablet (25 mg total) by  mouth 2 (two) times daily. 90 tablet 1   nitroGLYCERIN (NITROSTAT) 0.4 MG SL tablet Place 1 tablet (0.4 mg total) under the tongue every 5 (five) minutes as needed for chest pain. 25 tablet 3   Saw Palmetto 450 MG CAPS Take 900 mg by mouth daily.     vitamin C (ASCORBIC ACID) 500 MG tablet Take 500 mg by mouth daily.      Inpatient Medications    Scheduled Meds:   Continuous Infusions:  sodium chloride 10 mL/hr at 12/17/20 0908    PRN Meds:    ALLERGIES   Allergies  Allergen Reactions   Crestor [Rosuvastatin] Other (See Comments)    Weakness in the legs   Lipitor [Atorvastatin]    Penicillins Other (See Comments)    Childhood   Sulfa Antibiotics Other (See Comments)     Childhood   Zetia [Ezetimibe] Other (See Comments)    Weakness in legs   Zocor [Simvastatin] Other (See Comments)    Weakness in legs    ROS   Pertinent items noted in HPI and remainder of comprehensive ROS otherwise negative.  Vitals   Vitals:   12/17/20 0856  BP: (!) 167/93  Pulse: 74  Resp: 17  Temp: 97.6 F (36.4 C)  TempSrc: Oral  SpO2: 100%  Weight: 95.7 kg  Height: 6\' 5"  (1.956 m)   No intake or output data in the 24 hours ending 12/17/20 0929 Filed Weights   12/17/20 0856  Weight: 95.7 kg    Physical Exam   General appearance: alert and no distress Lungs: clear to auscultation bilaterally Heart: irregularly irregular rhythm Abdomen: soft, non-tender; bowel sounds normal; no masses,  no organomegaly Extremities: extremities normal, atraumatic, no cyanosis or edema Skin: Skin color, texture, turgor normal. No rashes or lesions Psych: Pleasant  Labs   No results found for this or any previous visit (from the past 48 hour(s)).  ECG   afib - Personally Reviewed  Telemetry   Afib - Personally Reviewed  Radiology   No results found.  Cardiac Studies   N/A  Assessment   Principal Problem:   Persistent atrial fibrillation (HCC)   Plan   Persistent atrial fibrillation - no missed doses of Eliquis. Plan for elective DCCV today. He is agreeable.  Time Spent Directly with Patient:  I have spent a total of 25 minutes with patient reviewing hospital notes, telemetry, EKGs, labs and examining the patient as well as establishing an assessment and plan that was discussed with the patient.  > 50% of time was spent in direct patient care.   Length of Stay:  LOS: 0 days   Pixie Casino, MD, [facility], [place] Director of the Advanced Lipid Disorders &  Cardiovascular Risk Reduction Clinic Diplomate of the American Board of Clinical Lipidology Attending Cardiologist  Direct Dial: [phone]  Fax: [phone]   Website:  [email or web address] 12/17/2020, 9:29 AM

## 2020-12-17 NOTE — Anesthesia Preprocedure Evaluation (Signed)
Anesthesia Evaluation  Patient identified by MRN, date of birth, ID band Patient awake    Reviewed: Allergy & Precautions, NPO status , Patient's Chart, lab work & pertinent test results, reviewed documented beta blocker date and time   Airway Mallampati: I  TM Distance: >3 FB Neck ROM: Full    Dental  (+) Partial Upper, Caps, Dental Advisory Given   Pulmonary former smoker,    Pulmonary exam normal breath sounds clear to auscultation       Cardiovascular hypertension, Pt. on medications and Pt. on home beta blockers + CAD and + Cardiac Stents  + dysrhythmias Atrial Fibrillation  Rhythm:Irregular Rate:Normal     Neuro/Psych negative neurological ROS  negative psych ROS   GI/Hepatic negative GI ROS, Neg liver ROS,   Endo/Other  Hyperlipidemia  Renal/GU negative Renal ROS  negative genitourinary   Musculoskeletal negative musculoskeletal ROS (+)   Abdominal   Peds  Hematology Eliquis therapy-last dose this am   Anesthesia Other Findings   Reproductive/Obstetrics                             Anesthesia Physical Anesthesia Plan  ASA: 3  Anesthesia Plan: General   Post-op Pain Management:    Induction: Intravenous  PONV Risk Score and Plan: 3 and Treatment may vary due to age or medical condition, Ondansetron and Propofol infusion  Airway Management Planned: Natural Airway and Mask  Additional Equipment:   Intra-op Plan:   Post-operative Plan:   Informed Consent: I have reviewed the patients History and Physical, chart, labs and discussed the procedure including the risks, benefits and alternatives for the proposed anesthesia with the patient or authorized representative who has indicated his/her understanding and acceptance.     Dental advisory given  Plan Discussed with: CRNA and Anesthesiologist  Anesthesia Plan Comments:         Anesthesia Quick Evaluation

## 2020-12-17 NOTE — Discharge Instructions (Signed)

## 2020-12-17 NOTE — CV Procedure (Signed)
   CARDIOVERSION NOTE  Procedure: Electrical Cardioversion Indications:  Atrial Fibrillation  Procedure Details:  Consent: Risks of procedure as well as the alternatives and risks of each were explained to the (patient/caregiver).  Consent for procedure obtained.  Time Out: Verified patient identification, verified procedure, site/side was marked, verified correct patient position, special equipment/implants available, medications/allergies/relevent history reviewed, required imaging and test results available.  Performed  Patient placed on cardiac monitor, pulse oximetry, supplemental oxygen as necessary.  Sedation given:  propofol per anesthesia Pacer pads placed anterior and posterior chest.  Cardioverted 2 time(s).  Cardioverted at 150J biphasic.  Impression: Findings: Post procedure EKG shows: NSR Complications: None Patient did tolerate procedure well.  Plan: Successful DCCV to NSR with a single 150J biphasic shock- follow-up in the office as scheduled.  Time Spent Directly with the Patient:  30 minutes   Pixie Casino, MD, [facility], [place] Director of the Advanced Lipid Disorders &  Cardiovascular Risk Reduction Clinic Diplomate of the American Board of Clinical Lipidology Attending Cardiologist  Direct Dial: [phone]  Fax: [phone]  Website:  [email or web address] 12/17/2020, 11:45 AM

## 2020-12-17 NOTE — Anesthesia Postprocedure Evaluation (Signed)
Anesthesia Post Note  Patient: William Jefferson  Procedure(s) Performed: CARDIOVERSION     Patient location during evaluation: PACU Anesthesia Type: General Level of consciousness: awake and alert and oriented Pain management: pain level controlled Vital Signs Assessment: post-procedure vital signs reviewed and stable Respiratory status: spontaneous breathing, nonlabored ventilation and respiratory function stable Cardiovascular status: blood pressure returned to baseline and stable Postop Assessment: no apparent nausea or vomiting Anesthetic complications: no   No notable events documented.  Last Vitals:  Vitals:   12/17/20 1022 12/17/20 1033  BP: (!) 144/94 (!) 141/92  Pulse: 70 65  Resp: 19 17  Temp:  (!) 36.4 C  SpO2: 98% 98%    Last Pain:  Vitals:   12/17/20 1033  TempSrc: Oral  PainSc: 0-No pain                 Tamieka Rancourt A.

## 2020-12-17 NOTE — Transfer of Care (Signed)
Immediate Anesthesia Transfer of Care Note  Patient: William Jefferson  Procedure(s) Performed: CARDIOVERSION  Patient Location: Endoscopy Unit  Anesthesia Type:General  Level of Consciousness: drowsy  Airway & Oxygen Therapy: Patient Spontanous Breathing  Post-op Assessment: Report given to RN and Post -op Vital signs reviewed and stable  Post vital signs: Reviewed and stable  Last Vitals:  Vitals Value Taken Time  BP    Temp    Pulse    Resp    SpO2      Last Pain:  Vitals:   12/17/20 0856  TempSrc: Oral  PainSc: 3          Complications: No notable events documented.

## 2020-12-19 ENCOUNTER — Encounter (HOSPITAL_COMMUNITY): Payer: Self-pay | Admitting: Internal Medicine

## 2020-12-21 ENCOUNTER — Encounter: Payer: Self-pay | Admitting: Nurse Practitioner

## 2020-12-21 ENCOUNTER — Other Ambulatory Visit: Payer: Self-pay

## 2020-12-21 ENCOUNTER — Ambulatory Visit (INDEPENDENT_AMBULATORY_CARE_PROVIDER_SITE_OTHER): Payer: PPO | Admitting: Nurse Practitioner

## 2020-12-21 VITALS — BP 124/79 | HR 57 | Temp 97.7°F | Wt 211.0 lb

## 2020-12-21 DIAGNOSIS — L0291 Cutaneous abscess, unspecified: Secondary | ICD-10-CM

## 2020-12-21 NOTE — Patient Instructions (Signed)
Wound Care, Adult Taking care of your wound properly can help to prevent pain, infection, and scarring. It can also help your wound heal more quickly. Follow instructionsfrom your health care provider about how to care for your wound. Supplies needed: Soap and water. Wound cleanser. Gauze. If needed, a clean bandage (dressing) or other type of wound dressing material to cover or place in the wound. Follow your health care provider's instructions about what dressing supplies to use. Cream or ointment to apply to the wound, if told by your health care provider. How to care for your wound Cleaning the wound Ask your health care provider how to clean the wound. This may include: Using mild soap and water or a wound cleanser. Using a clean gauze to pat the wound dry after cleaning it. Do not rub or scrub the wound. Dressing care Wash your hands with soap and water for at least 20 seconds before and after you change the dressing. If soap and water are not available, use hand sanitizer. Change your dressing as told by your health care provider. This may include: Cleaning or rinsing out (irrigating) the wound. Placing a dressing over the wound or in the wound (packing). Covering the wound with an outer dressing. Leave any stitches (sutures), skin glue, or adhesive strips in place. These skin closures may need to stay in place for 2 weeks or longer. If adhesive strip edges start to loosen and curl up, you may trim the loose edges. Do not remove adhesive strips completely unless your health care provider tells you to do that. Ask your health care provider when you can leave the wound uncovered. Checking for infection Check your wound area every day for signs of infection. Check for: More redness, swelling, or pain. Fluid or blood. Warmth. Pus or a bad smell.  Follow these instructions at home Medicines If you were prescribed an antibiotic medicine, cream, or ointment, take or apply it as told by  your health care provider. Do not stop using the antibiotic even if your condition improves. If you were prescribed pain medicine, take it 30 minutes before you do any wound care or as told by your health care provider. Take over-the-counter and prescription medicines only as told by your health care provider. Eating and drinking Eat a diet that includes protein, vitamin A, vitamin C, and other nutrient-rich foods to help the wound heal. Foods rich in protein include meat, fish, eggs, dairy, beans, and nuts. Foods rich in vitamin A include carrots and dark green, leafy vegetables. Foods rich in vitamin C include citrus fruits, tomatoes, broccoli, and peppers. Drink enough fluid to keep your urine pale yellow. General instructions Do not take baths, swim, use a hot tub, or do anything that would put the wound underwater until your health care provider approves. Ask your health care provider if you may take showers. You may only be allowed to take sponge baths. Do not scratch or pick at the wound. Keep it covered as told by your health care provider. Return to your normal activities as told by your health care provider. Ask your health care provider what activities are safe for you. Protect your wound from the sun when you are outside for the first 6 months, or for as long as told by your health care provider. Cover up the scar area or apply sunscreen that has an SPF of at least 30. Do not use any products that contain nicotine or tobacco, such as cigarettes, e-cigarettes, and chewing tobacco.   These may delay wound healing. If you need help quitting, ask your health care provider. Keep all follow-up visits as told by your health care provider. This is important. Contact a health care provider if: You received a tetanus shot and you have swelling, severe pain, redness, or bleeding at the injection site. Your pain is not controlled with medicine. You have any of these signs of infection: More  redness, swelling, or pain around the wound. Fluid or blood coming from the wound. Warmth coming from the wound. Pus or a bad smell coming from the wound. A fever or chills. You are nauseous or you vomit. You are dizzy. Get help right away if: You have a red streak of skin near the area around your wound. Your wound has been closed with staples, sutures, skin glue, or adhesive strips and it begins to open up and separate. Your wound is bleeding, and the bleeding does not stop with gentle pressure. You have a rash. You faint. You have trouble breathing. These symptoms may represent a serious problem that is an emergency. Do not wait to see if the symptoms will go away. Get medical help right away. Call your local emergency services (911 in the [place] Do not drive yourself to the hospital. Summary Always wash your hands with soap and water for at least 20 seconds before and after changing your dressing. Change your dressing as told by your health care provider. To help with healing, eat foods that are rich in protein, vitamin A, vitamin C, and other nutrients. Check your wound every day for signs of infection. Contact your health care provider if you suspect that your wound is infected. This information is not intended to replace advice given to you by your health care provider. Make sure you discuss any questions you have with your healthcare provider. Document Revised: 04/04/2019 Document Reviewed: 04/04/2019 Elsevier Patient Education  2022 Elsevier Inc.  

## 2020-12-21 NOTE — Assessment & Plan Note (Signed)
Abscess healing as expected.  Follow-up as needed, the plan is if abscess starts feeling up again surgical intervention will be needed to remove the pocket

## 2020-12-21 NOTE — Progress Notes (Signed)
Acute Office Visit  Subjective:    Patient ID: William Jefferson, male    DOB: [birth year], 75 y.o.   MRN: [id number]  Chief Complaint  Patient presents with   Abscess    Abscess This is a chronic problem. The current episode started more than 1 month ago. The problem has been rapidly improving. Pertinent negatives include no chest pain, chills, fatigue or fever. Nothing aggravates the symptoms. Treatments tried: Antibiotics.  Wound healing as expected.  Past Medical History:  Diagnosis Date   Bladder stone    removed in 2014 by Dr. Jeffie Pollock   Cataract    Coronary artery disease    Hyperlipidemia    statin intolerance   Hypertension     Past Surgical History:  Procedure Laterality Date   CARDIAC CATHETERIZATION  06/10/1998   large P150 iliac stent to RCA - 3.5x69m NIR stent (Dr. RMarella Chimes   CARDIAC CATHETERIZATION  07/16/2007   mid-distal LAD stenosis, normal LV function, 2.5x146mPromus stent to obtuse marginal, 2.5x1820mromus stent to osital & proximal diagonal (Dr. R. Marella Chimesr. W. Domenic Moras CARDIOVERSION N/A 12/17/2020   Procedure: CARDIOVERSION;  Surgeon: HilPixie CasinoD;  [facility];  Service: Cardiovascular;  Laterality: N/A;   MELANOMA EXCISION  2018   [place]   bruce myoview; normal pattern of perfusion, EF 77%   PROSTATE BIOPSY  2002   TRANSTHORACIC ECHOCARDIOGRAM  08/2012   mild conc LVH    Family History  Problem Relation Age of Onset   Cancer Mother        breast   Emphysema Father    Cirrhosis Brother    Alcohol abuse Brother     Social History   Socioeconomic History   Marital status: Married    Spouse name: Not on file   Number of children: 3   Years of education: 16   Highest education level: Bachelor's degree (e.g., BA, AB, BS)  Occupational History    Employer: [employer]  Tobacco Use   Smoking status: Former    Packs/day: 3.00    Years: 30.00    Pack years: 90.00    Types:  Cigarettes    Quit date: 08/03/1997    Years since quitting: 23.4   Smokeless tobacco: Never  Vaping Use   Vaping Use: Never used  Substance and Sexual Activity   Alcohol use: Not Currently   Drug use: Not Currently   Sexual activity: Not Currently  Other Topics Concern   Not on file  Social History Narrative   Lives home with wife, family nearby   Social Determinants of Health   Financial Resource Strain: Low Risk    Difficulty of Paying Living Expenses: Not hard at all  Food Insecurity: No Food Insecurity   Worried About RunCharity fundraiser the Last Year: Never true   RanArboriculturist the Last Year: Never true  Transportation Needs: No Transportation Needs   Lack of Transportation (Medical): No   Lack of Transportation (Non-Medical): No  Physical Activity: Sufficiently Active   Days of Exercise per Week: 7 days   Minutes of Exercise per Session: 30 min  Stress: No Stress Concern Present   Feeling of Stress : Not at all  Social Connections: Socially Integrated   Frequency of Communication with Friends and Family: More than three times a week   Frequency of Social Gatherings with Friends and Family: More than three times  a week   Attends Religious Services: More than 4 times per year   Active Member of Clubs or Organizations: Yes   Attends Music therapist: More than 4 times per year   Marital Status: Married  Human resources officer Violence: Not At Risk   Fear of Current or Ex-Partner: No   Emotionally Abused: No   Physically Abused: No   Sexually Abused: No    Outpatient Medications Prior to Visit  Medication Sig Dispense Refill   AMBULATORY NON FORMULARY MEDICATION Take 180 mg by mouth daily. Medication Name:bempedoic acid vs a placebo, CLEAR Research Study drug provided     apixaban (ELIQUIS) 5 MG TABS tablet TAKE  (1)  TABLET TWICE A DAY. 60 tablet 5   aspirin EC 81 MG tablet Take 1 tablet (81 mg total) by mouth daily. Swallow whole. 90 tablet 3    cephALEXin (KEFLEX) 500 MG capsule Take 500 mg by mouth daily.     lisinopril (ZESTRIL) 10 MG tablet Take 1 tablet (10 mg total) by mouth daily. 90 tablet 0   Lutein 20 MG CAPS Take 20 mg by mouth daily.     metoprolol tartrate (LOPRESSOR) 25 MG tablet Take 1 tablet (25 mg total) by mouth 2 (two) times daily. 90 tablet 1   Saw Palmetto 450 MG CAPS Take 900 mg by mouth daily.     vitamin C (ASCORBIC ACID) 500 MG tablet Take 500 mg by mouth daily.     nitroGLYCERIN (NITROSTAT) 0.4 MG SL tablet Place 1 tablet (0.4 mg total) under the tongue every 5 (five) minutes as needed for chest pain. 25 tablet 3   No facility-administered medications prior to visit.    Allergies  Allergen Reactions   Crestor [Rosuvastatin] Other (See Comments)    Weakness in the legs   Lipitor [Atorvastatin]    Penicillins Other (See Comments)    Childhood   Sulfa Antibiotics Other (See Comments)    Childhood   Zetia [Ezetimibe] Other (See Comments)    Weakness in legs   Zocor [Simvastatin] Other (See Comments)    Weakness in legs    Review of Systems  Constitutional:  Negative for chills, fatigue and fever.  HENT: Negative.    Respiratory: Negative.    Cardiovascular:  Negative for chest pain.  Genitourinary: Negative.   Skin:        Healing abscess  All other systems reviewed and are negative.     Objective:    Physical Exam Vitals and nursing note reviewed.  Constitutional:      Appearance: Normal appearance.  HENT:     Head: Normocephalic.  Eyes:     Conjunctiva/sclera: Conjunctivae normal.  Cardiovascular:     Rate and Rhythm: Normal rate and regular rhythm.     Pulses: Normal pulses.     Heart sounds: Normal heart sounds.  Pulmonary:     Effort: Pulmonary effort is normal.     Breath sounds: Normal breath sounds.  Abdominal:     General: Bowel sounds are normal.  Skin:    Comments: Healing abscess  Neurological:     Mental Status: He is alert and oriented to person, place, and time.   Psychiatric:        Behavior: Behavior normal.    BP 124/79   Pulse (!) 57   Temp 97.7 F (36.5 C) (Temporal)   Wt 211 lb (95.7 kg)   SpO2 97%   BMI 25.02 kg/m  Wt Readings from Last 3 Encounters:  12/21/20 211 lb (95.7 kg)  12/17/20 211 lb (95.7 kg)  12/15/20 (P) 214 lb (97.1 kg)    Health Maintenance Due  Topic Date Due   Hepatitis C Screening  Never done   COLONOSCOPY (Pts 45-63yr Insurance coverage will need to be confirmed)  Never done   COVID-19 Vaccine (4 - Booster for [place]) 08/17/2020    There are no preventive care reminders to display for this patient.   Lab Results  Component Value Date   TSH 0.891 08/02/2020   Lab Results  Component Value Date   WBC 8.6 12/08/2020   HGB 17.3 12/08/2020   HCT 52.5 (H) 12/08/2020   MCV 94 12/08/2020   PLT 238 12/08/2020   Lab Results  Component Value Date   NA 141 12/08/2020   K 4.8 12/08/2020   CO2 21 12/08/2020   GLUCOSE 140 (H) 12/08/2020   BUN 15 12/08/2020   CREATININE 1.44 (H) 12/08/2020   BILITOT 0.4 06/03/2020   ALKPHOS 72 06/03/2020   AST 13 06/03/2020   ALT 19 06/03/2020   PROT 7.1 06/03/2020   ALBUMIN 4.5 06/03/2020   CALCIUM 10.2 12/08/2020   EGFR 51 (L) 12/08/2020   Lab Results  Component Value Date   CHOL 198 06/03/2020   Lab Results  Component Value Date   HDL 26 (L) 06/03/2020   Lab Results  Component Value Date   LDLCALC 131 (H) 06/03/2020   Lab Results  Component Value Date   TRIG 229 (H) 06/03/2020   Lab Results  Component Value Date   CHOLHDL 7.6 (H) 06/03/2020   No results found for: HGBA1C     Assessment & Plan:   Problem List Items Addressed This Visit       Other   Abscess - Primary    Abscess healing as expected.  Follow-up as needed, the plan is if abscess starts feeling up again surgical intervention will be needed to remove the pocket         No orders of the defined types were placed in this encounter.    OIvy Lynn NP

## 2020-12-23 ENCOUNTER — Other Ambulatory Visit: Payer: Self-pay

## 2020-12-23 DIAGNOSIS — I10 Essential (primary) hypertension: Secondary | ICD-10-CM

## 2020-12-23 MED ORDER — METOPROLOL TARTRATE 25 MG PO TABS: 25.0000 mg | ORAL_TABLET | Freq: Two times a day (BID) | ORAL | 1 refills | Status: DC

## 2020-12-23 NOTE — Addendum Note (Signed)
Addended by: Sandie Ano D on: 12/23/2020 10:40 AM   Modules accepted: Orders

## 2020-12-27 ENCOUNTER — Telehealth: Payer: Self-pay | Admitting: *Deleted

## 2020-12-27 DIAGNOSIS — I10 Essential (primary) hypertension: Secondary | ICD-10-CM

## 2020-12-27 MED ORDER — METOPROLOL TARTRATE 25 MG PO TABS: 25.0000 mg | ORAL_TABLET | Freq: Two times a day (BID) | ORAL | 0 refills | Status: DC

## 2020-12-27 NOTE — Telephone Encounter (Signed)
TC Metoprolol quantity was sent in BID for #90 with a refill, [place] took this to given him a 90 day quantity of #180, sent in another refill for correct 90 day quantity of #180

## 2021-01-07 ENCOUNTER — Telehealth: Payer: Self-pay | Admitting: *Deleted

## 2021-01-10 NOTE — Telephone Encounter (Signed)
Just completed the post end of study phone call for the CLEAR research study. Subject's doing good, no new AE's or SAE's to report to sponsor at this time. All concomitant medications have been reviewed and updated if applicable in the [facility] system.

## 2021-01-14 ENCOUNTER — Ambulatory Visit (INDEPENDENT_AMBULATORY_CARE_PROVIDER_SITE_OTHER): Payer: PPO | Admitting: Nurse Practitioner

## 2021-01-14 ENCOUNTER — Encounter: Payer: Self-pay | Admitting: Nurse Practitioner

## 2021-01-14 DIAGNOSIS — J069 Acute upper respiratory infection, unspecified: Secondary | ICD-10-CM | POA: Diagnosis not present

## 2021-01-14 MED ORDER — DM-GUAIFENESIN ER 30-600 MG PO TB12: 1.0000 | ORAL_TABLET | Freq: Two times a day (BID) | ORAL | 0 refills | Status: DC

## 2021-01-14 NOTE — Progress Notes (Signed)
   Virtual Visit  Note Due to COVID-19 pandemic this visit was conducted virtually. This visit type was conducted due to national recommendations for restrictions regarding the COVID-19 Pandemic (e.g. social distancing, sheltering in place) in an effort to limit this patient's exposure and mitigate transmission in our community. All issues noted in this document were discussed and addressed.  A physical exam was not performed with this format.  I connected with William Jefferson on 01/14/21 at 3:20 PM by telephone and verified that I am speaking with the correct person using two identifiers. William Jefferson is currently located at home and spouse is with patient during visit. The provider, [name], NP is located in their office at time of visit.  I discussed the limitations, risks, security and privacy concerns of performing an evaluation and management service by telephone and the availability of in person appointments. I also discussed with the patient that there may be a patient responsible charge related to this service. The patient expressed understanding and agreed to proceed.   History and Present Illness:  URI  This is a new problem. The current episode started yesterday (In the last 24 hours). The problem has been unchanged. Maximum temperature: Low-grade fever 99. The fever has been present for 1 to 2 days. Associated symptoms include congestion, coughing and headaches. Pertinent negatives include no ear pain, sneezing or sore throat. He has tried acetaminophen for the symptoms. The treatment provided mild relief.     Review of Systems  HENT:  Positive for congestion. Negative for ear pain, sneezing and sore throat.   Respiratory:  Positive for cough.   Neurological:  Positive for headaches.  All other systems reviewed and are negative.   Observations/Objective:  Televisit patient did not sound to be in distress Assessment and Plan: Completed COVID-19 swab results  pending, guaifenesin for cough and congestion, Tylenol for headache and fever.  Increase hydration, seek emergency care over the weekend with uncontrolled fever and worsening symptoms.  Patient verbalized understanding.  Follow Up Instructions: Follow-up with worsening unresolved symptoms.    I discussed the assessment and treatment plan with the patient. The patient was provided an opportunity to ask questions and all were answered. The patient agreed with the plan and demonstrated an understanding of the instructions.   The patient was advised to call back or seek an in-person evaluation if the symptoms worsen or if the condition fails to improve as anticipated.  The above assessment and management plan was discussed with the patient. The patient verbalized understanding of and has agreed to the management plan. Patient is aware to call the clinic if symptoms persist or worsen. Patient is aware when to return to the clinic for a follow-up visit. Patient educated on when it is appropriate to go to the emergency department.   Time call ended: 3:30 PM  Provided 10 minutes of non-face-to-face time.  [name], NP

## 2021-01-14 NOTE — Assessment & Plan Note (Signed)
Completed COVID-19 swab results pending, guaifenesin for cough and congestion, Tylenol for headache and fever.  Increase hydration, seek emergency care over the weekend with uncontrolled fever and worsening symptoms.  Patient verbalized understanding.

## 2021-01-15 LAB — SARS-COV-2, NAA 2 DAY TAT

## 2021-01-15 LAB — NOVEL CORONAVIRUS, NAA: SARS-CoV-2, NAA: NOT DETECTED

## 2021-01-18 ENCOUNTER — Ambulatory Visit: Payer: PPO | Admitting: Internal Medicine

## 2021-02-15 NOTE — Progress Notes (Signed)
Office Visit    Patient Name: William Jefferson Date of Encounter: 02/16/2021  PCP:  [name], NP   [facility]  Cardiologist:  Pixie Casino, MD  Advanced Practice Provider:  No care team member to display Electrophysiologist:  None      Chief Complaint    William Jefferson is a 75 y.o. male with a hx of atrial fibrillation, coronary artery disease (s/p iliac stent to RCA in 1999, s/p Om and diagonal stent in 2009) presents today for follow-up after cardioversion  Past Medical History    Past Medical History:  Diagnosis Date   Bladder stone    removed in 2014 by Dr. Jeffie Pollock   Cataract    Coronary artery disease    Hyperlipidemia    statin intolerance   Hypertension    Past Surgical History:  Procedure Laterality Date   CARDIAC CATHETERIZATION  06/10/1998   large P150 iliac stent to RCA - 3.5x74m NIR stent (Dr. RMarella Chimes   CARDIAC CATHETERIZATION  07/16/2007   mid-distal LAD stenosis, normal LV function, 2.5x141mPromus stent to obtuse marginal, 2.5x1820mromus stent to osital & proximal diagonal (Dr. R. Marella Chimesr. W. Domenic Moras CARDIOVERSION N/A 12/17/2020   Procedure: CARDIOVERSION;  Surgeon: HilPixie CasinoD;  [facility];  Service: Cardiovascular;  Laterality: N/A;   MELANOMA EXCISION  2018   NM MYOEmerado/2010   bruce myoview; normal pattern of perfusion, EF 77%   PROSTATE BIOPSY  2002   TRANSTHORACIC ECHOCARDIOGRAM  08/2012   mild conc LVH    Allergies  Allergies  Allergen Reactions   Crestor [Rosuvastatin] Other (See Comments)    Weakness in the legs   Lipitor [Atorvastatin]    Penicillins Other (See Comments)    Childhood   Sulfa Antibiotics Other (See Comments)    Childhood   Zetia [Ezetimibe] Other (See Comments)    Weakness in legs   Zocor [Simvastatin] Other (See Comments)    Weakness in legs    History of Present Illness    William Jefferson a 74 25o. male with a hx of  atrial fibrillation, coronary artery disease (s/p iliac stent to RCA in 1999, s/p Om and diagonal stent in 2009) last seen for cardioversion 12/17/2020.   Cardiac history dates back to 1999 when he underwent angiography and found of ostial right coronary artery lesion of the large vessel.  He ultimately had placement iliac stent as no coronary stents of that side were available at the time.  In 2009 he had repeat angina was found of circumflex and diagonal lesions were stented with DES.  The OM1 was stented with 25 x 10 mm Promus stent postdilated to 3.0mm71md diagonal 25 x 18mm69mmus stent post dilated to 2.75mm.73me has been intolerant to multiple statins including Crestor 20 mg, Lipitor 40 mg, Zetia 10 mg, simvastatin 40 mg, Pravastatin '40mg'$  daily.   He works as a mail cDispensing optician waZigmund Danieleen January 2021 by Dr. O. NeiFarris Hasreoperative evaluation was noted to be in rate controlled atrial fibrillation.  He was switched to Eliquis and echocardiogram performed.  Echo with normal LVEF no evidence of atrial enlargement.  He was seen May 2022 and persistent atrial fibrillation with frequent PVCs.  He did note missed doses of Eliquis.  He was recommended for consistent utilization of Eliquis and eventually underwent cardioversion 12/17/2020.  He presents today for follow-up.  Reports feeling overall well. Reports no shortness of breath nor dyspnea on exertion. Reports no chest pain, pressure, or tightness. No edema, orthopnea, PND. Reports no palpitations.  He thinks in December his blood pressure was more erratic and his atrial fibrillation might date back to that time.  Notes he has been having persistently low blood pressures.  He reports no lightheadedness, dizziness, near syncope nor syncope.  EKGs/Labs/Other Studies Reviewed:   The following studies were reviewed today:  Echo 08/02/20  1. Left ventricular ejection fraction, by estimation, is 60 to 65%. The  left ventricle has normal function. The  left ventricle has no regional  wall motion abnormalities. Left ventricular diastolic parameters are  indeterminate in the setting of atrial  fibrillation.   2. Right ventricular systolic function is normal. The right ventricular  size is normal. Tricuspid regurgitation signal is inadequate for assessing  PA pressure.   3. The mitral valve is grossly normal. Trivial mitral valve  regurgitation.   4. The aortic valve is tricuspid. There is mild calcification of the  aortic valve. Aortic valve regurgitation is not visualized.   5. The inferior vena cava is normal in size with greater than 50%  respiratory variability, suggesting right atrial pressure of 3 mmHg.   EKG:  EKG is ordered today.  The ekg ordered today demonstrates normal sinus rhythm 68 bpm with no acute ST/T wave changes.   Recent Labs: 06/03/2020: ALT 19 08/02/2020: TSH 0.891 12/08/2020: BUN 15; Creatinine, Ser 1.44; Hemoglobin 17.3; Platelets 238; Potassium 4.8; Sodium 141  Recent Lipid Panel    Component Value Date/Time   CHOL 198 06/03/2020 1612   TRIG 229 (H) 06/03/2020 1612   TRIG 87 08/31/2016 0909   HDL 26 (L) 06/03/2020 1612   HDL 31 (L) 08/31/2016 0909   CHOLHDL 7.6 (H) 06/03/2020 1612   CHOLHDL 5.9 12/25/2014 0921   VLDL 25 12/25/2014 0921   LDLCALC 131 (H) 06/03/2020 1612    Home Medications   Current Meds  Medication Sig   apixaban (ELIQUIS) 5 MG TABS tablet TAKE  (1)  TABLET TWICE A DAY.   aspirin EC 81 MG tablet Take 1 tablet (81 mg total) by mouth daily. Swallow whole.   cephALEXin (KEFLEX) 500 MG capsule Take 500 mg by mouth daily.   lisinopril (ZESTRIL) 5 MG tablet Take 1 tablet (5 mg total) by mouth daily.   Lutein 20 MG CAPS Take 20 mg by mouth daily.   metoprolol tartrate (LOPRESSOR) 25 MG tablet Take 1 tablet (25 mg total) by mouth 2 (two) times daily.   nitroGLYCERIN (NITROSTAT) 0.4 MG SL tablet Place 1 tablet (0.4 mg total) under the tongue every 5 (five) minutes as needed for chest pain.    Saw Palmetto 450 MG CAPS Take 900 mg by mouth daily.   SF 5000 PLUS 1.1 % CREA dental cream Take by mouth.   vitamin C (ASCORBIC ACID) 500 MG tablet Take 500 mg by mouth daily.   [DISCONTINUED] dextromethorphan-guaiFENesin (MUCINEX DM) 30-600 MG 12hr tablet Take 1 tablet by mouth 2 (two) times daily.   [DISCONTINUED] lisinopril (ZESTRIL) 10 MG tablet Take 1 tablet (10 mg total) by mouth daily.     Review of Systems      All other systems reviewed and are otherwise negative except as noted above.  Physical Exam    VS:  BP 98/62 (BP Location: Left Arm, Patient Position: Sitting, Cuff Size: Normal)   Pulse 68   Ht '6\' 5"'$  (1.956 m)  Wt 211 lb 9.6 oz (96 kg)   BMI 25.09 kg/m  , BMI Body mass index is 25.09 kg/m.  Wt Readings from Last 3 Encounters:  02/16/21 211 lb 9.6 oz (96 kg)  12/21/20 211 lb (95.7 kg)  12/17/20 211 lb (95.7 kg)     GEN: Well nourished, well developed, in no acute distress. HEENT: normal. Neck: Supple, no JVD, carotid bruits, or masses. Cardiac: RRR, no murmurs, rubs, or gallops. No clubbing, cyanosis, edema.  Radials/PT 2+ and equal bilaterally.  Respiratory:  Respirations regular and unlabored, clear to auscultation bilaterally. GI: Soft, nontender, nondistended. MS: No deformity or atrophy. Skin: Warm and dry, no rash. Neuro:  Strength and sensation are intact. Psych: Normal affect.  Assessment & Plan    Persistent atrial fibrillation /chronic anticoagulation -EKG today shows he is maintaining normal sinus rhythm after cardioversion.  Denies palpitations.  Continue Eliquis 5 mg twice daily, denies bleeding complications.  HLD, LDL goal less than 70 /statin myalgia -intolerant of statins including rosuvastatin, atorvastatin, simvastatin, pravastatin.  Intolerant of Zetia and of the weakness in his legs.  His previously declined PCSK9 I.  Recommend lipid-lowering diet and regular cardiovascular exercise.  HTN -now with hypotension.  Reduce lisinopril to 5  mg daily.  Continue metoprolol tartrate 25 mg twice daily.  For continued hypotension could consider further reduction of dose of lisinopril.  CAD - Stable with no anginal symptoms. No indication for ischemic evaluation.  GDMT includes aspirin, metoprolol. No statin due to intolerance.   Disposition: Follow up in 3 month(s) with Dr. Debara Pickett or APP.  Signed, Loel Dubonnet, NP 02/16/2021, 12:20 PM [facility]

## 2021-02-16 ENCOUNTER — Encounter (HOSPITAL_BASED_OUTPATIENT_CLINIC_OR_DEPARTMENT_OTHER): Payer: Self-pay | Admitting: Family

## 2021-02-16 ENCOUNTER — Other Ambulatory Visit: Payer: Self-pay

## 2021-02-16 ENCOUNTER — Ambulatory Visit (HOSPITAL_BASED_OUTPATIENT_CLINIC_OR_DEPARTMENT_OTHER): Payer: PPO | Admitting: Family

## 2021-02-16 VITALS — BP 98/62 | HR 68 | Ht 77.0 in | Wt 211.6 lb

## 2021-02-16 DIAGNOSIS — I10 Essential (primary) hypertension: Secondary | ICD-10-CM

## 2021-02-16 DIAGNOSIS — I4819 Other persistent atrial fibrillation: Secondary | ICD-10-CM

## 2021-02-16 DIAGNOSIS — Z7901 Long term (current) use of anticoagulants: Secondary | ICD-10-CM | POA: Diagnosis not present

## 2021-02-16 DIAGNOSIS — I25118 Atherosclerotic heart disease of native coronary artery with other forms of angina pectoris: Secondary | ICD-10-CM

## 2021-02-16 DIAGNOSIS — E782 Mixed hyperlipidemia: Secondary | ICD-10-CM | POA: Diagnosis not present

## 2021-02-16 MED ORDER — LISINOPRIL 5 MG PO TABS: 5.0000 mg | ORAL_TABLET | Freq: Every day | ORAL | 1 refills | Status: DC

## 2021-02-16 NOTE — Patient Instructions (Signed)
Medication Instructions:  Your physician has recommended you make the following change in your medication:   CHANGE Lisinopril to '5mg'$  daily   *If you need a refill on your cardiac medications before your next appointment, please call your pharmacy*   Lab Work: None ordered today.   Testing/Procedures: Your EKG today shows normal sinus rhythm.    Follow-Up: At [facility], you and your health needs are our priority.  As part of our continuing mission to provide you with exceptional heart care, we have created designated Provider Care Teams.  These Care Teams include your primary Cardiologist (physician) and Advanced Practice Providers (APPs -  Physician Assistants and Nurse Practitioners) who all work together to provide you with the care you need, when you need it.  We recommend signing up for the patient portal called "MyChart".  Sign up information is provided on this After Visit Summary.  MyChart is used to connect with patients for Virtual Visits (Telemedicine).  Patients are able to view lab/test results, encounter notes, upcoming appointments, etc.  Non-urgent messages can be sent to your provider as well.   To learn more about what you can do with MyChart, go to [email or web address].    Your next appointment:   3-4 month(s)  The format for your next appointment:   In Person  Provider:   You may see Pixie Casino, MD or one of the following Advanced Practice Providers on your designated Care Team:   Almyra Deforest, PA-C Fabian Sharp, PA-C or  Roby Lofts, [place]   Other Instructions  Heart Healthy Diet Recommendations: A low-salt diet is recommended. Meats should be grilled, baked, or boiled. Avoid fried foods. Focus on lean protein sources like fish or chicken with vegetables and fruits. The American Heart Association is a Microbiologist!    Exercise recommendations: The American Heart Association recommends 150 minutes of moderate intensity exercise weekly. Try 30  minutes of moderate intensity exercise 4-5 times per week. This could include walking, jogging, or swimming.

## 2021-02-24 DIAGNOSIS — L570 Actinic keratosis: Secondary | ICD-10-CM | POA: Diagnosis not present

## 2021-02-24 DIAGNOSIS — L814 Other melanin hyperpigmentation: Secondary | ICD-10-CM | POA: Diagnosis not present

## 2021-02-24 DIAGNOSIS — X32XXXD Exposure to sunlight, subsequent encounter: Secondary | ICD-10-CM | POA: Diagnosis not present

## 2021-02-24 DIAGNOSIS — Z1283 Encounter for screening for malignant neoplasm of skin: Secondary | ICD-10-CM | POA: Diagnosis not present

## 2021-02-24 DIAGNOSIS — Z85820 Personal history of malignant melanoma of skin: Secondary | ICD-10-CM | POA: Diagnosis not present

## 2021-02-24 DIAGNOSIS — D225 Melanocytic nevi of trunk: Secondary | ICD-10-CM | POA: Diagnosis not present

## 2021-02-24 DIAGNOSIS — D485 Neoplasm of uncertain behavior of skin: Secondary | ICD-10-CM | POA: Diagnosis not present

## 2021-02-24 DIAGNOSIS — L820 Inflamed seborrheic keratosis: Secondary | ICD-10-CM | POA: Diagnosis not present

## 2021-02-24 DIAGNOSIS — Z08 Encounter for follow-up examination after completed treatment for malignant neoplasm: Secondary | ICD-10-CM | POA: Diagnosis not present

## 2021-07-06 ENCOUNTER — Encounter (HOSPITAL_BASED_OUTPATIENT_CLINIC_OR_DEPARTMENT_OTHER): Payer: Self-pay | Admitting: Internal Medicine

## 2021-07-06 ENCOUNTER — Ambulatory Visit (HOSPITAL_BASED_OUTPATIENT_CLINIC_OR_DEPARTMENT_OTHER): Payer: PPO | Admitting: Internal Medicine

## 2021-07-06 ENCOUNTER — Other Ambulatory Visit: Payer: Self-pay

## 2021-07-06 VITALS — BP 105/68 | HR 62 | Ht 77.0 in | Wt 221.2 lb

## 2021-07-06 DIAGNOSIS — M791 Myalgia, unspecified site: Secondary | ICD-10-CM | POA: Diagnosis not present

## 2021-07-06 DIAGNOSIS — I2510 Atherosclerotic heart disease of native coronary artery without angina pectoris: Secondary | ICD-10-CM | POA: Diagnosis not present

## 2021-07-06 DIAGNOSIS — E782 Mixed hyperlipidemia: Secondary | ICD-10-CM

## 2021-07-06 DIAGNOSIS — R972 Elevated prostate specific antigen [PSA]: Secondary | ICD-10-CM | POA: Diagnosis not present

## 2021-07-06 DIAGNOSIS — I4819 Other persistent atrial fibrillation: Secondary | ICD-10-CM

## 2021-07-06 DIAGNOSIS — T466X5D Adverse effect of antihyperlipidemic and antiarteriosclerotic drugs, subsequent encounter: Secondary | ICD-10-CM | POA: Diagnosis not present

## 2021-07-06 NOTE — Patient Instructions (Signed)
Medication Instructions:  Cholesterol med options --  -- Repatha, Praluent, Leqvio, Nexletol   *If you need a refill on your cardiac medications before your next appointment, please call your pharmacy*   Follow-Up: At [facility], you and your health needs are our priority.  As part of our continuing mission to provide you with exceptional heart care, we have created designated Provider Care Teams.  These Care Teams include your primary Cardiologist (physician) and Advanced Practice Providers (APPs -  Physician Assistants and Nurse Practitioners) who all work together to provide you with the care you need, when you need it.  We recommend signing up for the patient portal called "MyChart".  Sign up information is provided on this After Visit Summary.  MyChart is used to connect with patients for Virtual Visits (Telemedicine).  Patients are able to view lab/test results, encounter notes, upcoming appointments, etc.  Non-urgent messages can be sent to your provider as well.   To learn more about what you can do with MyChart, go to [email or web address].    Your next appointment:   12 month(s)  The format for your next appointment:   In Person  Provider:   Pixie Casino, MD {

## 2021-07-06 NOTE — Progress Notes (Signed)
OFFICE NOTE  Chief Complaint:  Routine follow-up  Primary Care Physician: [name], NP  HPI:  William Jefferson is a pleasant 76 year old male formerly followed by Dr. Rollene Fare. He is here today to establish new cardiac care. His past medical history is significant for coronary artery disease. In 1999 he was having symptoms of angina and presented to her primary care physician who performed an EKG and sent him to the emergency room. There he will have angiography and was found to have a ostial right coronary artery lesion of a very large vessel. He ultimately had placement of an iliac stent, as no coronary stents of that size were available at the time. This is actually stayed very patent for long period of time. In 2009 he had another episode of chest pain and was found to have circumflex and diagonal lesions which were stented with drug-eluting stents. The OM1 was stented with a 2 5 x 10 mm Promus stent that was postdilated to 3.0 mm.  The diagonal was a 2 5 x 18 mm Promus stent that there was also postdilated to 2.75 mm. He's done well since then without any chest pain complaints. 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 He denies any chest pain or shortness of breath. Unfortunately been intolerant to all statins and Zetia. He is unwilling to take any more of those medicines as is found in its cause leg pain, weakness and difficulty performing his job. He is on monotherapy Plavix, but not aspirin, but denies any history of allergy aspirin. His only other issue is a rising PSA. He said a number of prostate biopsies and actually developed an infection at one time. He's had atypical cells but no determinant prostate cancer.  I saw Mr. William Jefferson back today. He continues to be without complaints. 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 He does have a history of being intolerant to at  least 5 or 6 different statins. He's not as cholesterol checked in about 4 years. We discussed the new medications that are now out to treat cholesterol a said that he be willing to try at least a low-dose statin is to be on a qualifying dose of max tolerated therapy. We would either have to recheck his cholesterol level.  Mr. William Jefferson returns today for follow-up. Overall he is doing well and is asymptomatic. He is started taking pravastatin 1-2 times a week which is certainly better than nothing. He's been intolerant of all other statins including Zetia. He's managed to lose a couple pounds but generally hovers around 215. Blood pressure is well-controlled.  08/21/2016  Mr. William Jefferson is seen today in follow-up. Since I last saw him he had tried to take pravastatin as he's been intolerant to Crestor 20 mg, Lipitor 40 mg, Zetia 10 mg and Zocor 40 mg doses in the past due to myalgias. He was placed on pravastatin 40 mg with additional symptoms and decreased it to 3 times weekly and eventually stopped the medication. He continues to have elevated cholesterol with LDL-C greater than 150. He has known coronary artery disease with prior PCI in 1999 and again in 2009. We discussed today additional treatment options including PC SK 9 inhibitors and potentially enrolling him in the [facility]-10 trial.  08/03/2017  Mr. William Jefferson was seen today in follow-up.  Overall he seems to be doing well.  Unfortunately he has been intolerant to number statins.  He declined the [employer] 10 trial.  We discussed the PCS K9 inhibitors today however is concerned about the injectables.  He asked about what pill options may be available.  I mentioned that he may be a candidate for the CLEAR trial.  He said he may be interested in this.  08/08/2018  Mr. William Jefferson is seen today in follow-up.  He is again without complaints.  He continues to work Event organiser.  He has been involved in a research trial with bempedoic acid (clear trial).  Is not clear  whether he is on placebo or treatment.  So far he seems to be tolerating things.  That study may conclude earlier this year as there is likely going to be release of the medication to market.  Otherwise he is without complaints.  No chest pain no shortness of breath.  09/12/2019  Mr. William Jefferson is seen today for follow-up.  He continues to do well.  Nuys any chest pain or worsening shortness of breath.  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  He still enrolled in the clear trial.  I will reach out to the study coordinators to see whether or not he has crossed over to treatment or is enrolled in the clear outcomes trial.  He was made aware that the trial compound bempedoic acid is commercially available as Nexletol.  11/08/2020  Mr. William Jefferson returns today for follow-up.  He was seen in January by my partner Dr. Audie Box for an acute add-on for preoperative visit.  He was found to be in rate controlled A. fib at the time.  He was appropriately switched to Eliquis and an echocardiogram was performed.  This showed normal systolic function with no evidence of atrial enlargement.  He was supposed to have a planned right knee surgery ultimately that was not performed.  He returns today for follow-up.  His EKG shows he is in persistent A. fib with frequent PVCs in a quadrigeminal pattern.  He reports he has missed several doses of Eliquis over the past several months.  We discussed options including continuing his rate control or a possibility of an elective cardioversion.  Although he says he denies any chest pain or fatigue, it was unclear to him how long he may have been in A. fib.  07/06/2021  Mr. William Jefferson returns today for follow-up.  He recently saw Laurann Montana, NP for follow-up of his cardioversion.  He has been maintaining sinus rhythm.  He says he feels well.  He is on aspirin and Eliquis and denies any bleeding issues.  Blood pressure is well controlled.  We again  discussed his dyslipidemia.  He has been intolerant to numerous statins and ezetimibe and had been in the clear bempedoic acid trial but he was unfortunately on placebo.  The study ended in June to 2022.  We discussed other options including PCSK9 inhibitors, Leqvio and bempedoic acid (Nexletol).  He would have to consider the financial implications of these and wants to think about it more.  PMHx:  Past Medical History:  Diagnosis Date   Bladder stone    removed in 2014 by Dr. Jeffie Pollock   Cataract    Coronary artery disease    Hyperlipidemia    statin intolerance   Hypertension     Past Surgical History:  Procedure Laterality Date   CARDIAC CATHETERIZATION  06/10/1998   large P150 iliac stent to RCA - 3.5x58mm  NIR stent (Dr. Marella Chimes)   CARDIAC CATHETERIZATION  07/16/2007   mid-distal LAD stenosis, normal LV function, 2.5x19mm Promus stent to obtuse marginal, 2.5x74mm Promus stent to osital & proximal diagonal (Dr. Marella Chimes, Dr. Domenic Moras)   CARDIOVERSION N/A 12/17/2020   Procedure: CARDIOVERSION;  Surgeon: Pixie Casino, MD;  Location: [facility];  Service: Cardiovascular;  Laterality: N/A;   MELANOMA EXCISION  2018   NM MYOCAR Rosa  08/2008   bruce myoview; normal pattern of perfusion, EF 77%   PROSTATE BIOPSY  2002   TRANSTHORACIC ECHOCARDIOGRAM  08/2012   mild conc LVH    FAMHx:  Family History  Problem Relation Age of Onset   Cancer Mother        breast   Emphysema Father    Cirrhosis Brother    Alcohol abuse Brother     SOCHx:   reports that he quit smoking about 23 years ago. His smoking use included cigarettes. He has a 90.00 pack-year smoking history. He has never used smokeless tobacco. He reports that he does not currently use alcohol. He reports that he does not currently use drugs.  ALLERGIES:  Allergies  Allergen Reactions   Crestor [Rosuvastatin] Other (See Comments)    Weakness in the legs   Lipitor [Atorvastatin]    Penicillins Other  (See Comments)    Childhood   Sulfa Antibiotics Other (See Comments)    Childhood   Zetia [Ezetimibe] Other (See Comments)    Weakness in legs   Zocor [Simvastatin] Other (See Comments)    Weakness in legs    ROS: Pertinent items noted in HPI and remainder of comprehensive ROS otherwise negative.  HOME MEDS: Current Outpatient Medications  Medication Sig Dispense Refill   apixaban (ELIQUIS) 5 MG TABS tablet TAKE  (1)  TABLET TWICE A DAY. 60 tablet 5   aspirin EC 81 MG tablet Take 1 tablet (81 mg total) by mouth daily. Swallow whole. 90 tablet 3   cephALEXin (KEFLEX) 500 MG capsule Take 500 mg by mouth daily.     lisinopril (ZESTRIL) 5 MG tablet Take 1 tablet (5 mg total) by mouth daily. 90 tablet 1   Lutein 20 MG CAPS Take 20 mg by mouth daily.     metoprolol tartrate (LOPRESSOR) 25 MG tablet Take 1 tablet (25 mg total) by mouth 2 (two) times daily. 180 tablet 0   nitroGLYCERIN (NITROSTAT) 0.4 MG SL tablet Place 1 tablet (0.4 mg total) under the tongue every 5 (five) minutes as needed for chest pain. 25 tablet 3   Saw Palmetto 450 MG CAPS Take 900 mg by mouth daily.     SF 5000 PLUS 1.1 % CREA dental cream Take by mouth.     vitamin C (ASCORBIC ACID) 500 MG tablet Take 500 mg by mouth daily.     No current facility-administered medications for this visit.    LABS/IMAGING: No results found for this or any previous visit (from the past 48 hour(s)). No results found.  VITALS: BP 105/68    Pulse 62    Ht 6\' 5"  (1.956 m)    Wt 221 lb 3.2 oz (100.3 kg)    SpO2 97%    BMI 26.23 kg/m   EXAM: General appearance: alert and no distress Neck: no carotid bruit and no JVD Lungs: clear to auscultation bilaterally Heart: regular rate and rhythm, S1, S2 normal, no murmur, click, rub or gallop Abdomen: soft, non-tender; bowel sounds normal; no masses,  no organomegaly  Extremities: extremities normal, atraumatic, no cyanosis or edema Pulses: 2+ and symmetric Skin: Skin color, texture,  turgor normal. No rashes or lesions Neurologic: Grossly normal Psych: Mood, affect normal  EKG: Normal sinus rhythm at 62-personally reviewed  ASSESSMENT: Coronary artery disease status post PCI to the ostial RCA in 1999, proximal diagonal and circumflex marginal in 2009 Persistent atrial fibrillation -maintaining sinus rhythm after cardioversion on Eliquis Dyslipidemia-intolerant to statins and Zetia Hypertension-controlled Elevated PSA - due to prostatitis CLINICAL TRIAL (CLEAR, bempedoic acid)-ended in 2022  PLAN: 1.   Mr. Berthold seems to be doing well and is maintaining sinus rhythm after cardioversion on Eliquis.  He was enrolled in the clear trial but unfortunately I believe had placebo.  Cholesterol remains elevated.  We talked about options including bempedoic acid, PCSK9 inhibitors or Leqvio.  He wants to investigate these further from a cost standpoint.  We will plan follow-up annually or sooner as necessary.  He can contact us if he wants to move forward.  Pixie Casino, MD, [facility], [place] Director of the [facility] Lipid Disorders &  Cardiovascular Risk Reduction Clinic Diplomate of the American Board of Clinical Lipidology Attending Cardiologist  Direct Dial: [phone]   Fax: [phone]  Website:  [email or web address] 07/06/2021, 2:48 PM

## 2021-07-11 ENCOUNTER — Other Ambulatory Visit: Payer: Self-pay | Admitting: Cardiovascular Disease

## 2021-07-11 NOTE — Telephone Encounter (Signed)
Prescription refill request for Eliquis received. Indication:Afib Last office visit:1/23 Scr:1.4 Age: 76 Weight:100.3 kg  Prescription refilled

## 2021-07-13 DIAGNOSIS — N411 Chronic prostatitis: Secondary | ICD-10-CM | POA: Diagnosis not present

## 2021-07-13 DIAGNOSIS — R972 Elevated prostate specific antigen [PSA]: Secondary | ICD-10-CM | POA: Diagnosis not present

## 2021-08-15 ENCOUNTER — Other Ambulatory Visit: Payer: Self-pay | Admitting: Nurse Practitioner

## 2021-08-15 DIAGNOSIS — I10 Essential (primary) hypertension: Secondary | ICD-10-CM

## 2021-08-25 DIAGNOSIS — L821 Other seborrheic keratosis: Secondary | ICD-10-CM | POA: Diagnosis not present

## 2021-08-25 DIAGNOSIS — L570 Actinic keratosis: Secondary | ICD-10-CM | POA: Diagnosis not present

## 2021-08-25 DIAGNOSIS — D225 Melanocytic nevi of trunk: Secondary | ICD-10-CM | POA: Diagnosis not present

## 2021-08-25 DIAGNOSIS — Z1283 Encounter for screening for malignant neoplasm of skin: Secondary | ICD-10-CM | POA: Diagnosis not present

## 2021-08-25 DIAGNOSIS — L820 Inflamed seborrheic keratosis: Secondary | ICD-10-CM | POA: Diagnosis not present

## 2021-08-25 DIAGNOSIS — Z08 Encounter for follow-up examination after completed treatment for malignant neoplasm: Secondary | ICD-10-CM | POA: Diagnosis not present

## 2021-08-25 DIAGNOSIS — X32XXXD Exposure to sunlight, subsequent encounter: Secondary | ICD-10-CM | POA: Diagnosis not present

## 2021-08-25 DIAGNOSIS — Z85820 Personal history of malignant melanoma of skin: Secondary | ICD-10-CM | POA: Diagnosis not present

## 2021-10-12 ENCOUNTER — Encounter: Payer: Self-pay | Admitting: Nurse Practitioner

## 2021-10-12 ENCOUNTER — Other Ambulatory Visit: Payer: Self-pay | Admitting: Nurse Practitioner

## 2021-10-12 ENCOUNTER — Ambulatory Visit (INDEPENDENT_AMBULATORY_CARE_PROVIDER_SITE_OTHER): Payer: PPO | Admitting: Nurse Practitioner

## 2021-10-12 VITALS — BP 115/67 | HR 80 | Temp 98.7°F | Ht 77.0 in | Wt 219.0 lb

## 2021-10-12 DIAGNOSIS — I10 Essential (primary) hypertension: Secondary | ICD-10-CM | POA: Diagnosis not present

## 2021-10-12 DIAGNOSIS — E782 Mixed hyperlipidemia: Secondary | ICD-10-CM | POA: Diagnosis not present

## 2021-10-12 MED ORDER — LISINOPRIL 5 MG PO TABS: 5.0000 mg | ORAL_TABLET | Freq: Every day | ORAL | 1 refills | Status: DC

## 2021-10-12 MED ORDER — METOPROLOL TARTRATE 25 MG PO TABS: 25.0000 mg | ORAL_TABLET | Freq: Two times a day (BID) | ORAL | 2 refills | Status: DC

## 2021-10-12 NOTE — Progress Notes (Addendum)
? ?Established Patient Office Visit ? ?Subjective:  ?Patient ID: William Jefferson, male    DOB: [birth year]  Age: 76 y.o. MRN: [id number] ? ?CC:  ?Chief Complaint  ?Patient presents with  ? chronic disease management  ? ? ?HPI ?William Jefferson presents for follow up of hypertension. Patient was diagnosed in 2015. The patient is tolerating the medication well without side effects. Compliance with treatment has been good; including taking medication as directed , maintains a healthy diet and regular exercise regimen , and following up as directed.  ? ?Mixed hyperlipidemia  ?Pt presents with hyperlipidemia. Patient was diagnosed in 08/17/2013. Compliance with treatment has been good The patient is compliant with medications, maintains a low cholesterol diet , follows up as directed , and maintains an exercise regimen . The patient denies experiencing any hypercholesterolemia related symptoms.   ? ? ?Past Medical History:  ?Diagnosis Date  ? Bladder stone   ? removed in 2014 by Dr. Jeffie Pollock  ? Cataract   ? Coronary artery disease   ? Hyperlipidemia   ? statin intolerance  ? Hypertension   ? ? ?Past Surgical History:  ?Procedure Laterality Date  ? CARDIAC CATHETERIZATION  06/10/1998  ? large P150 iliac stent to RCA - 3.5x84m NIR stent (Dr. RMarella Chimes  ? CARDIAC CATHETERIZATION  07/16/2007  ? mid-distal LAD stenosis, normal LV function, 2.5x112mPromus stent to obtuse marginal, 2.5x1871mromus stent to osital & proximal diagonal (Dr. R. Marella Chimesr. W. Domenic Moras? CARDIOVERSION N/A 12/17/2020  ? Procedure: CARDIOVERSION;  Surgeon: HilPixie CasinoD;  [facility];  Service: Cardiovascular;  Laterality: N/A;  ? MELANOMA EXCISION  2018  ? NM MYOCAR PERF WALL MOTION  08/2008  ? bruce myoview; normal pattern of perfusion, EF 77%  ? PROSTATE BIOPSY  2002  ? TRANSTHORACIC ECHOCARDIOGRAM  08/2012  ? mild conc LVH  ? ? ?Family History  ?Problem Relation Age of Onset  ? Cancer Mother   ?     breast  ? Emphysema Father    ? Cirrhosis Brother   ? Alcohol abuse Brother   ? ? ?Social History  ? ?Socioeconomic History  ? Marital status: Married  ?  Spouse name: Not on file  ? Number of children: 3  ? Years of education: 16 42 Highest education level: Bachelor's degree (e.g., BA, AB, BS)  ?Occupational History  ?  Employer: [employer]  ?Tobacco Use  ? Smoking status: Former  ?  Packs/day: 3.00  ?  Years: 30.00  ?  Pack years: 90.00  ?  Types: Cigarettes  ?  Quit date: 08/03/1997  ?  Years since quitting: 24.2  ? Smokeless tobacco: Never  ?Vaping Use  ? Vaping Use: Never used  ?Substance and Sexual Activity  ? Alcohol use: Not Currently  ? Drug use: Not Currently  ? Sexual activity: Not Currently  ?Other Topics Concern  ? Not on file  ?Social History Narrative  ? Lives home with wife, family nearby  ? ?Social Determinants of Health  ? ?Financial Resource Strain: Low Risk   ? Difficulty of Paying Living Expenses: Not hard at all  ?Food Insecurity: No Food Insecurity  ? Worried About [employer] fundraiser the Last Year: Never true  ? Ran Out of Food in the Last Year: Never true  ?Transportation Needs: No Transportation Needs  ? Lack of Transportation (Medical): No  ? Lack of Transportation (Non-Medical): No  ?Physical Activity: Sufficiently Active  ?  Days of Exercise per Week: 7 days  ? Minutes of Exercise per Session: 30 min  ?Stress: No Stress Concern Present  ? Feeling of Stress : Not at all  ?Social Connections: Socially Integrated  ? Frequency of Communication with Friends and Family: More than three times a week  ? Frequency of Social Gatherings with Friends and Family: More than three times a week  ? Attends Religious Services: More than 4 times per year  ? Active Member of Clubs or Organizations: Yes  ? Attends Archivist Meetings: More than 4 times per year  ? Marital Status: Married  ?Intimate Partner Violence: Not At Risk  ? Fear of Current or Ex-Partner: No  ? Emotionally Abused: No  ? Physically Abused: No  ?  Sexually Abused: No  ? ? ?Outpatient Medications Prior to Visit  ?Medication Sig Dispense Refill  ? aspirin EC 81 MG tablet Take 1 tablet (81 mg total) by mouth daily. Swallow whole. 90 tablet 3  ? ELIQUIS 5 MG TABS tablet TAKE (1) TABLET TWICE A DAY. 60 tablet 5  ? Lutein 20 MG CAPS Take 20 mg by mouth daily.    ? Saw Palmetto 450 MG CAPS Take 900 mg by mouth daily.    ? SF 5000 PLUS 1.1 % CREA dental cream Take by mouth.    ? vitamin C (ASCORBIC ACID) 500 MG tablet Take 500 mg by mouth daily.    ? cephALEXin (KEFLEX) 500 MG capsule Take 500 mg by mouth daily.    ? metoprolol tartrate (LOPRESSOR) 25 MG tablet Take 1 tablet (25 mg total) by mouth 2 (two) times daily. (NEEDS TO BE SEEN BEFORE NEXT REFILL) 60 tablet 0  ? nitroGLYCERIN (NITROSTAT) 0.4 MG SL tablet Place 1 tablet (0.4 mg total) under the tongue every 5 (five) minutes as needed for chest pain. 25 tablet 3  ? lisinopril (ZESTRIL) 5 MG tablet Take 1 tablet (5 mg total) by mouth daily. 90 tablet 1  ? ?No facility-administered medications prior to visit.  ? ? ?Allergies  ?Allergen Reactions  ? Crestor [Rosuvastatin] Other (See Comments)  ?  Weakness in the legs  ? Lipitor [Atorvastatin]   ? Penicillins Other (See Comments)  ?  Childhood  ? Sulfa Antibiotics Other (See Comments)  ?  Childhood  ? Zetia [Ezetimibe] Other (See Comments)  ?  Weakness in legs  ? Zocor [Simvastatin] Other (See Comments)  ?  Weakness in legs  ? ? ?ROS ?Review of Systems  ?Constitutional: Negative.   ?HENT: Negative.    ?Eyes: Negative.   ?Respiratory: Negative.    ?Cardiovascular: Negative.   ?Gastrointestinal: Negative.   ?Genitourinary: Negative.   ?Musculoskeletal: Negative.   ?Skin: Negative.  Negative for rash.  ?Neurological: Negative.   ?All other systems reviewed and are negative. ? ?  ?Objective:  ?  ?Physical Exam ?Vitals and nursing note reviewed.  ?Constitutional:   ?   Appearance: Normal appearance.  ?HENT:  ?   Head: Normocephalic.  ?   Right Ear: External ear normal.   ?   Left Ear: External ear normal.  ?   Nose: Nose normal.  ?   Mouth/Throat:  ?   Mouth: Mucous membranes are moist.  ?   Pharynx: Oropharynx is clear.  ?Eyes:  ?   Conjunctiva/sclera: Conjunctivae normal.  ?Cardiovascular:  ?   Rate and Rhythm: Normal rate and regular rhythm.  ?   Pulses: Normal pulses.  ?   Heart sounds: Normal heart sounds.  ?  Pulmonary:  ?   Effort: Pulmonary effort is normal.  ?   Breath sounds: Normal breath sounds.  ?Abdominal:  ?   General: Bowel sounds are normal.  ?Musculoskeletal:     ?   General: Normal range of motion.  ?Skin: ?   General: Skin is warm.  ?   Findings: No rash.  ?Neurological:  ?   General: No focal deficit present.  ?   Mental Status: He is alert and oriented to person, place, and time.  ?Psychiatric:     ?   Behavior: Behavior normal.  ? ? ?BP 115/67   Pulse 80   Temp 98.7 ?F (37.1 ?C)   Ht 6' 5"  (1.956 m)   Wt 219 lb (99.3 kg)   SpO2 96%   BMI 25.97 kg/m?  ?Wt Readings from Last 3 Encounters:  ?10/12/21 219 lb (99.3 kg)  ?07/06/21 221 lb 3.2 oz (100.3 kg)  ?02/16/21 211 lb 9.6 oz (96 kg)  ? ? ? ?There are no preventive care reminders to display for this patient. ? ? ?There are no preventive care reminders to display for this patient. ? ?Lab Results  ?Component Value Date  ? TSH 0.891 08/02/2020  ? ?Lab Results  ?Component Value Date  ? WBC 8.6 12/08/2020  ? HGB 17.3 12/08/2020  ? HCT 52.5 (H) 12/08/2020  ? MCV 94 12/08/2020  ? PLT 238 12/08/2020  ? ?Lab Results  ?Component Value Date  ? NA 141 12/08/2020  ? K 4.8 12/08/2020  ? CO2 21 12/08/2020  ? GLUCOSE 140 (H) 12/08/2020  ? BUN 15 12/08/2020  ? CREATININE 1.44 (H) 12/08/2020  ? BILITOT 0.4 06/03/2020  ? ALKPHOS 72 06/03/2020  ? AST 13 06/03/2020  ? ALT 19 06/03/2020  ? PROT 7.1 06/03/2020  ? ALBUMIN 4.5 06/03/2020  ? CALCIUM 10.2 12/08/2020  ? EGFR 51 (L) 12/08/2020  ? ?Lab Results  ?Component Value Date  ? CHOL 198 06/03/2020  ? ?Lab Results  ?Component Value Date  ? HDL 26 (L) 06/03/2020  ? ?Lab Results   ?Component Value Date  ? LDLCALC 131 (H) 06/03/2020  ? ?Lab Results  ?Component Value Date  ? TRIG 229 (H) 06/03/2020  ? ?Lab Results  ?Component Value Date  ? CHOLHDL 7.6 (H) 06/03/2020  ? ?No results found for:

## 2021-10-12 NOTE — Assessment & Plan Note (Addendum)
Blood pressure well controlled on current medication no changes necessary.  Labs completed today-CBC, CMP, lipid panel results pending. ? ?Patient is having slightly lower blood pressure. Advised patient to take Metroprolol 25 mg tablet by mouth once daily instead of twice daily.  Keep a blood pressure log and follow-up in 1 week. ?

## 2021-10-12 NOTE — Telephone Encounter (Signed)
Spoke to patient's wife and made her aware that patient needs appt. She will let him know to call us.  ?

## 2021-10-12 NOTE — Telephone Encounter (Signed)
Je NTBS 30 days given 08/15/21 ?

## 2021-10-12 NOTE — Assessment & Plan Note (Signed)
No new signs and symptoms of hyperlipidemia.  Completed lipid panel results pending.  Continue low-cholesterol diet, and exercise. ?

## 2021-10-12 NOTE — Patient Instructions (Signed)

## 2021-10-13 LAB — COMPREHENSIVE METABOLIC PANEL
ALT: 23 IU/L (ref 0–44)
AST: 18 IU/L (ref 0–40)
Albumin/Globulin Ratio: 1.9 (ref 1.2–2.2)
Albumin: 4.4 g/dL (ref 3.7–4.7)
Alkaline Phosphatase: 73 IU/L (ref 44–121)
BUN/Creatinine Ratio: 13 (ref 10–24)
BUN: 18 mg/dL (ref 8–27)
Bilirubin Total: 0.4 mg/dL (ref 0.0–1.2)
CO2: 22 mmol/L (ref 20–29)
Calcium: 9.6 mg/dL (ref 8.6–10.2)
Chloride: 108 mmol/L — ABNORMAL HIGH (ref 96–106)
Creatinine, Ser: 1.39 mg/dL — ABNORMAL HIGH (ref 0.76–1.27)
Globulin, Total: 2.3 g/dL (ref 1.5–4.5)
Glucose: 102 mg/dL — ABNORMAL HIGH (ref 70–99)
Potassium: 4.6 mmol/L (ref 3.5–5.2)
Sodium: 143 mmol/L (ref 134–144)
Total Protein: 6.7 g/dL (ref 6.0–8.5)
eGFR: 53 mL/min/{1.73_m2} — ABNORMAL LOW (ref >59)

## 2021-10-13 LAB — CBC WITH DIFFERENTIAL/PLATELET
Basophils Absolute: 0.1 10*3/uL (ref 0.0–0.2)
Basos: 1 %
EOS (ABSOLUTE): 0.3 10*3/uL (ref 0.0–0.4)
Eos: 3 %
Hematocrit: 46 % (ref 37.5–51.0)
Hemoglobin: 15.9 g/dL (ref 13.0–17.7)
Immature Grans (Abs): 0.1 10*3/uL (ref 0.0–0.1)
Immature Granulocytes: 1 %
Lymphocytes Absolute: 2.1 10*3/uL (ref 0.7–3.1)
Lymphs: 21 %
MCH: 31.7 pg (ref 26.6–33.0)
MCHC: 34.6 g/dL (ref 31.5–35.7)
MCV: 92 fL (ref 79–97)
Monocytes Absolute: 0.8 10*3/uL (ref 0.1–0.9)
Monocytes: 8 %
Neutrophils Absolute: 6.5 10*3/uL (ref 1.4–7.0)
Neutrophils: 66 %
Platelets: 237 10*3/uL (ref 150–450)
RBC: 5.01 x10E6/uL (ref 4.14–5.80)
RDW: 13.8 % (ref 11.6–15.4)
WBC: 9.8 10*3/uL (ref 3.4–10.8)

## 2021-10-13 LAB — LIPID PANEL
Chol/HDL Ratio: 7.7 ratio — ABNORMAL HIGH (ref 0.0–5.0)
Cholesterol, Total: 200 mg/dL — ABNORMAL HIGH (ref 100–199)
HDL: 26 mg/dL — ABNORMAL LOW (ref >39)
LDL Chol Calc (NIH): 132 mg/dL — ABNORMAL HIGH (ref 0–99)
Triglycerides: 231 mg/dL — ABNORMAL HIGH (ref 0–149)
VLDL Cholesterol Cal: 42 mg/dL — ABNORMAL HIGH (ref 5–40)

## 2021-10-21 ENCOUNTER — Other Ambulatory Visit (HOSPITAL_BASED_OUTPATIENT_CLINIC_OR_DEPARTMENT_OTHER): Payer: Self-pay | Admitting: Internal Medicine

## 2021-10-23 NOTE — Progress Notes (Signed)
Please have patient discontinue medication by tapering off medication. Reducing the dose by half every 2 weeks for a total of 4 weeks.  ?He has used half a dose for about 2 weeks, he can take half of half for the next 2 weeks and every other day till complete. ?

## 2021-12-14 ENCOUNTER — Ambulatory Visit (INDEPENDENT_AMBULATORY_CARE_PROVIDER_SITE_OTHER): Payer: PPO

## 2021-12-14 VITALS — Wt 219.0 lb

## 2021-12-14 DIAGNOSIS — Z0000 Encounter for general adult medical examination without abnormal findings: Secondary | ICD-10-CM

## 2021-12-14 NOTE — Progress Notes (Signed)
Subjective:   William Jefferson is a 76 y.o. male who presents for Medicare Annual/Subsequent preventive examination.  Virtual Visit via Telephone Note  I connected with  [name] on 12/14/21 at 11:15 AM EDT by telephone and verified that I am speaking with the correct person using two identifiers.  Location: Patient: Home Provider: [facility] Persons participating in the virtual visit: patient/Nurse Health Advisor   I discussed the limitations, risks, security and privacy concerns of performing an evaluation and management service by telephone and the availability of in person appointments. The patient expressed understanding and agreed to proceed.  Interactive audio and video telecommunications were attempted between this nurse and patient, however failed, due to patient having technical difficulties OR patient did not have access to video capability.  We continued and completed visit with audio only.  Some vital signs may be absent or patient reported.   Jasmeen Fritsch E Doneen Ollinger, LPN   Review of Systems     Cardiac Risk Factors include: advanced age (>22mn, >>40women);male gender;dyslipidemia;hypertension;Other (see comment), Risk factor comments: CAD, A.FIB     Objective:    Today's Vitals   12/14/21 1117  Weight: 219 lb (99.3 kg)   Body mass index is 25.97 kg/m.     12/14/2021   11:27 AM 12/17/2020    9:03 AM 12/13/2020   11:35 AM  Advanced Directives  Does Patient Have a Medical Advance Directive? No No No  Would patient like information on creating a medical advance directive? No - Patient declined No - Patient declined No - Patient declined    Current Medications (verified) Outpatient Encounter Medications as of 12/14/2021  Medication Sig   aspirin EC 81 MG tablet Take 1 tablet (81 mg total) by mouth daily. Swallow whole.   ELIQUIS 5 MG TABS tablet TAKE (1) TABLET TWICE A DAY.   lisinopril (ZESTRIL) 5 MG tablet Take 1 tablet (5 mg total) by mouth daily.   Lutein 20  MG CAPS Take 20 mg by mouth daily.   metoprolol tartrate (LOPRESSOR) 25 MG tablet Take 1 tablet (25 mg total) by mouth 2 (two) times daily. (NEEDS TO BE SEEN BEFORE NEXT REFILL)   Saw Palmetto 450 MG CAPS Take 900 mg by mouth daily.   SF 5000 PLUS 1.1 % CREA dental cream Take by mouth.   vitamin C (ASCORBIC ACID) 500 MG tablet Take 500 mg by mouth daily.   nitroGLYCERIN (NITROSTAT) 0.4 MG SL tablet PLACE 1 TABLET UNDER THE TONGUE EVERY 5 MINUTES AS NEEDED FOR CHEST PAIN   No facility-administered encounter medications on file as of 12/14/2021.    Allergies (verified) Crestor [rosuvastatin], Lipitor [atorvastatin], Penicillins, Sulfa antibiotics, Zetia [ezetimibe], and Zocor [simvastatin]   History: Past Medical History:  Diagnosis Date   Bladder stone    removed in 2014 by Dr. WJeffie Pollock  Cataract    Coronary artery disease    Hyperlipidemia    statin intolerance   Hypertension    Past Surgical History:  Procedure Laterality Date   CARDIAC CATHETERIZATION  06/10/1998   large P150 iliac stent to RCA - 3.5x182mNIR stent (Dr. R.Marella Chimes  CARDIAC CATHETERIZATION  07/16/2007   mid-distal LAD stenosis, normal LV function, 2.5x1065mromus stent to obtuse marginal, 2.5x18m89momus stent to osital & proximal diagonal (Dr. R. WMarella Chimes. W. GDomenic MorasCARDIOVERSION N/A 12/17/2020   Procedure: CARDIOVERSION;  Surgeon: HiltPixie Casino;  [facility];  Service: Cardiovascular;  Laterality: N/A;   MELANOMA EXCISION  2018   NM MYOCAR PERF WALL MOTION  08/2008   bruce myoview; normal pattern of perfusion, EF 77%   PROSTATE BIOPSY  2002   TRANSTHORACIC ECHOCARDIOGRAM  08/2012   mild conc LVH   Family History  Problem Relation Age of Onset   Cancer Mother        breast   Emphysema Father    Cirrhosis Brother    Alcohol abuse Brother    Social History   Socioeconomic History   Marital status: Married    Spouse name: Joycelyn Schmid   Number of children: 3   Years of education:  16   Highest education level: Bachelor's degree (e.g., BA, AB, BS)  Occupational History    Employer: [employer]  Tobacco Use   Smoking status: Former    Packs/day: 3.00    Years: 30.00    Total pack years: 90.00    Types: Cigarettes    Quit date: 08/03/1997    Years since quitting: 24.3   Smokeless tobacco: Never  Vaping Use   Vaping Use: Never used  Substance and Sexual Activity   Alcohol use: Not Currently   Drug use: Not Currently   Sexual activity: Not Currently  Other Topics Concern   Not on file  Social History Narrative   Lives home with wife, family nearby   Social Determinants of Health   Financial Resource Strain: Low Risk  (12/14/2021)   Overall Financial Resource Strain (CARDIA)    Difficulty of Paying Living Expenses: Not hard at all  Food Insecurity: No Food Insecurity (12/14/2021)   Hunger Vital Sign    Worried About Running Out of Food in the Last Year: Never true    Ran Out of Food in the Last Year: Never true  Transportation Needs: No Transportation Needs (12/14/2021)   PRAPARE - Hydrologist (Medical): No    Lack of Transportation (Non-Medical): No  Physical Activity: Sufficiently Active (12/14/2021)   Exercise Vital Sign    Days of Exercise per Week: 7 days    Minutes of Exercise per Session: 30 min  Stress: No Stress Concern Present (12/14/2021)   [place]    Feeling of Stress : Not at all  Social Connections: [place] (12/14/2021)   Social Connection and Isolation Panel [NHANES]    Frequency of Communication with Friends and Family: More than three times a week    Frequency of Social Gatherings with Friends and Family: More than three times a week    Attends Religious Services: More than 4 times per year    Active Member of [employer] or Organizations: Yes    Attends Music therapist: More than 4 times per year    Marital Status:  Married    Tobacco Counseling Counseling given: Not Answered   Clinical Intake:  Pre-visit preparation completed: Yes  Pain : No/denies pain     BMI - recorded: 25.97 Nutritional Status: BMI 25 -29 Overweight Nutritional Risks: None Diabetes: No  How often do you need to have someone help you when you read instructions, pamphlets, or other written materials from your doctor or pharmacy?: 1 - Never  Diabetic? no  Interpreter Needed?: No  Information entered by :: Leler Brion, LPN   Activities of Daily Living    12/14/2021   11:26 AM  In your present state of health, do you have any difficulty performing the following activities:  Hearing? 0  Vision?  0  Difficulty concentrating or making decisions? 0  Walking or climbing stairs? 1  Comment hurts knees, but he can do this holding on  Dressing or bathing? 0  Doing errands, shopping? 0  Preparing Food and eating ? N  Using the Toilet? N  In the past six months, have you accidently leaked urine? N  Do you have problems with loss of bowel control? N  Managing your Medications? N  Managing your Finances? N  Housekeeping or managing your Housekeeping? N    Patient Care Team: [name], NP as PCP - General (Nurse Practitioner) Pixie Casino, MD as PCP - Cardiology (Cardiology) Irine Seal, MD as Attending Physician (Urology) Allyn Kenner, MD (Dermatology)  Indicate any recent Medical Services you may have received from other than [facility] providers in the past year (date may be approximate).     Assessment:   This is a routine wellness examination for Gunther.  Hearing/Vision screen Hearing Screening - Comments:: C/o mild to moderate hearing difficulties - declines hearing aids Vision Screening - Comments:: Wears reading glasses prn only - no optometrist  Dietary issues and exercise activities discussed: Current Exercise Habits: Home exercise routine, Type of exercise: walking, Time (Minutes): 30, Frequency  (Times/Week): 7, Weekly Exercise (Minutes/Week): 210, Intensity: Mild, Exercise limited by: orthopedic condition(s);cardiac condition(s)   Goals Addressed             This Visit's Progress    Exercise 3x per week (30 min per time)   On track      Depression Screen    12/14/2021   11:21 AM 10/12/2021    4:07 PM 12/21/2020    8:19 AM 12/13/2020   11:35 AM 12/10/2020    8:38 AM 12/03/2020    9:06 AM 11/26/2020    9:29 AM  PHQ 2/9 Scores  PHQ - 2 Score 0 0 0 0 0 0 0  PHQ- 9 Score  0  0 0 0 0    Fall Risk    12/14/2021   11:19 AM 10/12/2021    4:08 PM 12/21/2020    8:18 AM 12/13/2020   11:35 AM 12/10/2020    8:38 AM  Fall Risk   Falls in the past year? 0 0 0 1 1  Number falls in past yr: 0   0 0  Injury with Fall? 0   1 0  Risk for fall due to : Orthopedic patient   History of fall(s);Impaired balance/gait;Impaired vision;Orthopedic patient   Follow up Falls prevention discussed   Education provided;Falls prevention discussed     FALL RISK PREVENTION PERTAINING TO THE HOME:  Any stairs in or around the home? Yes  If so, are there any without handrails? No  Home free of loose throw rugs in walkways, pet beds, electrical cords, etc? Yes  Adequate lighting in your home to reduce risk of falls? Yes   ASSISTIVE DEVICES UTILIZED TO PREVENT FALLS:  Life alert? No  Use of a cane, walker or w/c? Yes  Grab bars in the bathroom? Yes  Shower chair or bench in shower? No  Elevated toilet seat or a handicapped toilet? No   TIMED UP AND GO:  Was the test performed? No . Telephonic visit  Cognitive Function:        12/14/2021   11:21 AM 12/13/2020   11:33 AM  6CIT Screen  What Year? 0 points 0 points  What month? 0 points 0 points  What time? 0 points 0 points  Count  back from 20 0 points 0 points  Months in reverse 0 points 0 points  Repeat phrase 0 points 0 points  Total Score 0 points 0 points    Immunizations Immunization History  Administered Date(s) Administered    PFIZER(Purple Top)SARS-COV-2 Vaccination 07/29/2019, 08/19/2019, 04/16/2020   Td 06/02/1998   Tdap 09/17/2020   Zoster, Live 03/31/2016    TDAP status: Up to date  Flu Vaccine status: Declined, Education has been provided regarding the importance of this vaccine but patient still declined. Advised may receive this vaccine at local pharmacy or Health Dept. Aware to provide a copy of the vaccination record if obtained from local pharmacy or Health Dept. Verbalized acceptance and understanding.  Pneumococcal vaccine status: Declined,  Education has been provided regarding the importance of this vaccine but patient still declined. Advised may receive this vaccine at local pharmacy or Health Dept. Aware to provide a copy of the vaccination record if obtained from local pharmacy or Health Dept. Verbalized acceptance and understanding.   Covid-19 vaccine status: Completed vaccines  Qualifies for Shingles Vaccine? Yes   Zostavax completed Yes   Shingrix Completed?: No.    Education has been provided regarding the importance of this vaccine. Patient has been advised to call insurance company to determine out of pocket expense if they have not yet received this vaccine. Advised may also receive vaccine at local pharmacy or Health Dept. Verbalized acceptance and understanding.  Screening Tests Health Maintenance  Topic Date Due   COVID-19 Vaccine (4 - Pfizer series) 06/11/2020   Zoster Vaccines- Shingrix (1 of 2) 01/11/2022 (Originally 04/29/1996)   Pneumonia Vaccine 37+ Years old (1 - PCV) 10/13/2022 (Originally 04/30/2011)   COLONOSCOPY (Pts 45-63yr Insurance coverage will need to be confirmed)  10/13/2022 (Originally 04/30/1991)   Hepatitis C Screening  10/13/2022 (Originally 04/29/1964)   INFLUENZA VACCINE  01/31/2022   TETANUS/TDAP  09/18/2030   HPV VACCINES  Aged Out    Health Maintenance  Health Maintenance Due  Topic Date Due   COVID-19 Vaccine (4 - Pfizer series) 06/11/2020     Colorectal cancer screening: No longer required.   Lung Cancer Screening: (Low Dose CT Chest recommended if Age 76-80years, 30 pack-year currently smoking OR have quit w/in 15years.) does not qualify.  Additional Screening:  Hepatitis C Screening: does qualify; DUE  Vision Screening: Recommended annual ophthalmology exams for early detection of glaucoma and other disorders of the eye. Is the patient up to date with their annual eye exam?  No  Who is the provider or what is the name of the office in which the patient attends annual eye exams? none If pt is not established with a provider, would they like to be referred to a provider to establish care? No .   Dental Screening: Recommended annual dental exams for proper oral hygiene  Community Resource Referral / Chronic Care Management: CRR required this visit?  No   CCM required this visit?  No      Plan:     I have personally reviewed and noted the following in the patient's chart:   Medical and social history Use of alcohol, tobacco or illicit drugs  Current medications and supplements including opioid prescriptions. Patient is not currently taking opioid prescriptions. Functional ability and status Nutritional status Physical activity Advanced directives List of other physicians Hospitalizations, surgeries, and ER visits in previous 12 months Vitals Screenings to include cognitive, depression, and falls Referrals and appointments  In addition, I have reviewed and discussed with  patient certain preventive protocols, quality metrics, and best practice recommendations. A written personalized care plan for preventive services as well as general preventive health recommendations were provided to patient.     Sandrea Hammond, LPN   7/84/1282   Nurse Notes: None

## 2021-12-14 NOTE — Patient Instructions (Signed)
William Jefferson , Thank you for taking time to come for your Medicare Wellness Visit. I appreciate your ongoing commitment to your health goals. Please review the following plan we discussed and let me know if I can assist you in the future.   Screening recommendations/referrals: Colonoscopy: Declined - no longer required due to age Recommended yearly ophthalmology/optometry visit for glaucoma screening and checkup Recommended yearly dental visit for hygiene and checkup  Vaccinations: Influenza vaccine: Declined - recommended every fall Pneumococcal vaccine: Declined - consider once per lifetime Prevnar-20 Tdap vaccine: done 09/17/2020 - Repeat in 10 years  Shingles vaccine: Zostavax done 2017 - Due for Shingrix which is 2 doses 2-6 months apart and over 90% effective     Covid-19: Done  07/29/2019, 08/19/2019, & 04/16/2020  Advanced directives: Advance directive discussed with you today. Even though you declined this today, please call our office should you change your mind, and we can give you the proper paperwork for you to fill out.   Conditions/risks identified: Continue walking 30 minutes each day, and aim for 6-8 glasses of water and 5 servings of fruits and vegetables each day.   Next appointment: Follow up in one year for your annual wellness visit.   Preventive Care 29 Years and Older, Male  Preventive care refers to lifestyle choices and visits with your health care provider that can promote health and wellness. What does preventive care include? A yearly physical exam. This is also called an annual well check. Dental exams once or twice a year. Routine eye exams. Ask your health care provider how often you should have your eyes checked. Personal lifestyle choices, including: Daily care of your teeth and gums. Regular physical activity. Eating a healthy diet. Avoiding tobacco and drug use. Limiting alcohol use. Practicing safe sex. Taking low doses of aspirin every day. Taking  vitamin and mineral supplements as recommended by your health care provider. What happens during an annual well check? The services and screenings done by your health care provider during your annual well check will depend on your age, overall health, lifestyle risk factors, and family history of disease. Counseling  Your health care provider may ask you questions about your: Alcohol use. Tobacco use. Drug use. Emotional well-being. Home and relationship well-being. Sexual activity. Eating habits. History of falls. Memory and ability to understand (cognition). Work and work Statistician. Screening  You may have the following tests or measurements: Height, weight, and BMI. Blood pressure. Lipid and cholesterol levels. These may be checked every 5 years, or more frequently if you are over 48 years old. Skin check. Lung cancer screening. You may have this screening every year starting at age 61 if you have a 30-pack-year history of smoking and currently smoke or have quit within the past 15 years. Fecal occult blood test (FOBT) of the stool. You may have this test every year starting at age 52. Flexible sigmoidoscopy or colonoscopy. You may have a sigmoidoscopy every 5 years or a colonoscopy every 10 years starting at age 49. Prostate cancer screening. Recommendations will vary depending on your family history and other risks. Hepatitis C blood test. Hepatitis B blood test. Sexually transmitted disease (STD) testing. Diabetes screening. This is done by checking your blood sugar (glucose) after you have not eaten for a while (fasting). You may have this done every 1-3 years. Abdominal aortic aneurysm (AAA) screening. You may need this if you are a current or former smoker. Osteoporosis. You may be screened starting at age 89 if  you are at high risk. Talk with your health care provider about your test results, treatment options, and if necessary, the need for more tests. Vaccines  Your  health care provider may recommend certain vaccines, such as: Influenza vaccine. This is recommended every year. Tetanus, diphtheria, and acellular pertussis (Tdap, Td) vaccine. You may need a Td booster every 10 years. Zoster vaccine. You may need this after age 72. Pneumococcal 13-valent conjugate (PCV13) vaccine. One dose is recommended after age 38. Pneumococcal polysaccharide (PPSV23) vaccine. One dose is recommended after age 76. Talk to your health care provider about which screenings and vaccines you need and how often you need them. This information is not intended to replace advice given to you by your health care provider. Make sure you discuss any questions you have with your health care provider. Document Released: 07/16/2015 Document Revised: 03/08/2016 Document Reviewed: 04/20/2015 Elsevier Interactive Patient Education  2017 [place] Prevention in the Home Falls can cause injuries. They can happen to people of all ages. There are many things you can do to make your home safe and to help prevent falls. What can I do on the outside of my home? Regularly fix the edges of walkways and driveways and fix any cracks. Remove anything that might make you trip as you walk through a door, such as a raised step or threshold. Trim any bushes or trees on the path to your home. Use bright outdoor lighting. Clear any walking paths of anything that might make someone trip, such as rocks or tools. Regularly check to see if handrails are loose or broken. Make sure that both sides of any steps have handrails. Any raised decks and porches should have guardrails on the edges. Have any leaves, snow, or ice cleared regularly. Use sand or salt on walking paths during winter. Clean up any spills in your garage right away. This includes oil or grease spills. What can I do in the bathroom? Use night lights. Install grab bars by the toilet and in the tub and shower. Do not use towel bars as  grab bars. Use non-skid mats or decals in the tub or shower. If you need to sit down in the shower, use a plastic, non-slip stool. Keep the floor dry. Clean up any water that spills on the floor as soon as it happens. Remove soap buildup in the tub or shower regularly. Attach bath mats securely with double-sided non-slip rug tape. Do not have throw rugs and other things on the floor that can make you trip. What can I do in the bedroom? Use night lights. Make sure that you have a light by your bed that is easy to reach. Do not use any sheets or blankets that are too big for your bed. They should not hang down onto the floor. Have a firm chair that has side arms. You can use this for support while you get dressed. Do not have throw rugs and other things on the floor that can make you trip. What can I do in the kitchen? Clean up any spills right away. Avoid walking on wet floors. Keep items that you use a lot in easy-to-reach places. If you need to reach something above you, use a strong step stool that has a grab bar. Keep electrical cords out of the way. Do not use floor polish or wax that makes floors slippery. If you must use wax, use non-skid floor wax. Do not have throw rugs and other things on  the floor that can make you trip. What can I do with my stairs? Do not leave any items on the stairs. Make sure that there are handrails on both sides of the stairs and use them. Fix handrails that are broken or loose. Make sure that handrails are as long as the stairways. Check any carpeting to make sure that it is firmly attached to the stairs. Fix any carpet that is loose or worn. Avoid having throw rugs at the top or bottom of the stairs. If you do have throw rugs, attach them to the floor with carpet tape. Make sure that you have a light switch at the top of the stairs and the bottom of the stairs. If you do not have them, ask someone to add them for you. What else can I do to help prevent  falls? Wear shoes that: Do not have high heels. Have rubber bottoms. Are comfortable and fit you well. Are closed at the toe. Do not wear sandals. If you use a stepladder: Make sure that it is fully opened. Do not climb a closed stepladder. Make sure that both sides of the stepladder are locked into place. Ask someone to hold it for you, if possible. Clearly mark and make sure that you can see: Any grab bars or handrails. First and last steps. Where the edge of each step is. Use tools that help you move around (mobility aids) if they are needed. These include: Canes. Walkers. Scooters. Crutches. Turn on the lights when you go into a dark area. Replace any light bulbs as soon as they burn out. Set up your furniture so you have a clear path. Avoid moving your furniture around. If any of your floors are uneven, fix them. If there are any pets around you, be aware of where they are. Review your medicines with your doctor. Some medicines can make you feel dizzy. This can increase your chance of falling. Ask your doctor what other things that you can do to help prevent falls. This information is not intended to replace advice given to you by your health care provider. Make sure you discuss any questions you have with your health care provider. Document Released: 04/15/2009 Document Revised: 11/25/2015 Document Reviewed: 07/24/2014 Elsevier Interactive Patient Education  2017 [employer].

## 2022-01-13 ENCOUNTER — Encounter: Payer: Self-pay | Admitting: Nurse Practitioner

## 2022-01-13 ENCOUNTER — Ambulatory Visit (INDEPENDENT_AMBULATORY_CARE_PROVIDER_SITE_OTHER): Payer: PPO | Admitting: Nurse Practitioner

## 2022-01-13 VITALS — BP 117/71 | HR 88 | Temp 98.7°F | Ht 77.0 in | Wt 218.0 lb

## 2022-01-13 DIAGNOSIS — I10 Essential (primary) hypertension: Secondary | ICD-10-CM

## 2022-01-13 DIAGNOSIS — E782 Mixed hyperlipidemia: Secondary | ICD-10-CM | POA: Diagnosis not present

## 2022-01-13 DIAGNOSIS — Z23 Encounter for immunization: Secondary | ICD-10-CM

## 2022-01-13 NOTE — Patient Instructions (Signed)
Hypertension, Adult ?Hypertension is another name for high blood pressure. High blood pressure forces your heart to work harder to pump blood. This can cause problems over time. ?There are two numbers in a blood pressure reading. There is a top number (systolic) over a bottom number (diastolic). It is best to have a blood pressure that is below 120/80. ?What are the causes? ?The cause of this condition is not known. Some other conditions can lead to high blood pressure. ?What increases the risk? ?Some lifestyle factors can make you more likely to develop high blood pressure: ?Smoking. ?Not getting enough exercise or physical activity. ?Being overweight. ?Having too much fat, sugar, calories, or salt (sodium) in your diet. ?Drinking too much alcohol. ?Other risk factors include: ?Having any of these conditions: ?Heart disease. ?Diabetes. ?High cholesterol. ?Kidney disease. ?Obstructive sleep apnea. ?Having a family history of high blood pressure and high cholesterol. ?Age. The risk increases with age. ?Stress. ?What are the signs or symptoms? ?High blood pressure may not cause symptoms. Very high blood pressure (hypertensive crisis) may cause: ?Headache. ?Fast or uneven heartbeats (palpitations). ?Shortness of breath. ?Nosebleed. ?Vomiting or feeling like you may vomit (nauseous). ?Changes in how you see. ?Very bad chest pain. ?Feeling dizzy. ?Seizures. ?How is this treated? ?This condition is treated by making healthy lifestyle changes, such as: ?Eating healthy foods. ?Exercising more. ?Drinking less alcohol. ?Your doctor may prescribe medicine if lifestyle changes do not help enough and if: ?Your top number is above 130. ?Your bottom number is above 80. ?Your personal target blood pressure may vary. ?Follow these instructions at home: ?Eating and drinking ? ?If told, follow the DASH eating plan. To follow this plan: ?Fill one half of your plate at each meal with fruits and vegetables. ?Fill one fourth of your plate  at each meal with whole grains. Whole grains include whole-wheat pasta, brown rice, and whole-grain bread. ?Eat or drink low-fat dairy products, such as skim milk or low-fat yogurt. ?Fill one fourth of your plate at each meal with low-fat (lean) proteins. Low-fat proteins include fish, chicken without skin, eggs, beans, and tofu. ?Avoid fatty meat, cured and processed meat, or chicken with skin. ?Avoid pre-made or processed food. ?Limit the amount of salt in your diet to less than 1,500 mg each day. ?Do not drink alcohol if: ?Your doctor tells you not to drink. ?You are pregnant, may be pregnant, or are planning to become pregnant. ?If you drink alcohol: ?Limit how much you have to: ?0-1 drink a day for women. ?0-2 drinks a day for men. ?Know how much alcohol is in your drink. In the [place] one drink equals one 12 oz bottle of beer (355 mL), one 5 oz glass of wine (148 mL), or one 1? oz glass of hard liquor (44 mL). ?Lifestyle ? ?Work with your doctor to stay at a healthy weight or to lose weight. Ask your doctor what the best weight is for you. ?Get at least 30 minutes of exercise that causes your heart to beat faster (aerobic exercise) most days of the week. This may include walking, swimming, or biking. ?Get at least 30 minutes of exercise that strengthens your muscles (resistance exercise) at least 3 days a week. This may include lifting weights or doing Pilates. ?Do not smoke or use any products that contain nicotine or tobacco. If you need help quitting, ask your doctor. ?Check your blood pressure at home as told by your doctor. ?Keep all follow-up visits. ?Medicines ?Take over-the-counter and prescription medicines   only as told by your doctor. Follow directions carefully. ?Do not skip doses of blood pressure medicine. The medicine does not work as well if you skip doses. Skipping doses also puts you at risk for problems. ?Ask your doctor about side effects or reactions to medicines that you should watch  for. ?Contact a doctor if: ?You think you are having a reaction to the medicine you are taking. ?You have headaches that keep coming back. ?You feel dizzy. ?You have swelling in your ankles. ?You have trouble with your vision. ?Get help right away if: ?You get a very bad headache. ?You start to feel mixed up (confused). ?You feel weak or numb. ?You feel faint. ?You have very bad pain in your: ?Chest. ?Belly (abdomen). ?You vomit more than once. ?You have trouble breathing. ?These symptoms may be an emergency. Get help right away. Call 911. ?Do not wait to see if the symptoms will go away. ?Do not drive yourself to the hospital. ?Summary ?Hypertension is another name for high blood pressure. ?High blood pressure forces your heart to work harder to pump blood. ?For most people, a normal blood pressure is less than 120/80. ?Making healthy choices can help lower blood pressure. If your blood pressure does not get lower with healthy choices, you may need to take medicine. ?This information is not intended to replace advice given to you by your health care provider. Make sure you discuss any questions you have with your health care provider. ?Document Revised: 04/07/2021 Document Reviewed: 04/07/2021 ?Elsevier Patient Education ? 2023 Elsevier Inc. ? ?

## 2022-01-13 NOTE — Progress Notes (Signed)
Established Patient Office Visit  Subjective   Patient ID: William Jefferson, male    DOB: 26-Apr-1946  Age: 75 y.o. MRN: [id number]  Chief Complaint  Patient presents with   Medical Management of Chronic Issues    3 month     HPI   Pt presents for follow up of hypertension. Patient was diagnosed in 08/14/2013. The patient is tolerating the medication well without side effects. Compliance with treatment has been good; including taking medication as directed , maintains a healthy diet and regular exercise regimen , and following up as directed.  Current medication lisinopril 5 mg tablet by mouth daily.  And Metroprolol 25 mg tablet by mouth twice daily.  Mixed hyperlipidemia  Pt presents with hyperlipidemia. Patient was diagnosed in 08/14/2013. Compliance with treatment has been; The patient is compliant and maintains a low cholesterol diet , follows up as directed , and maintains an exercise regimen . The patient denies experiencing any hypercholesterolemia related symptoms.     Patient Active Problem List   Diagnosis Date Noted   Upper respiratory infection with cough and congestion 01/14/2021   Persistent atrial fibrillation ([place]) 12/17/2020   Abscess 11/10/2020   Tear of medial meniscus of knee 07/08/2020   Annual physical exam 06/03/2020   Right hip pain 06/03/2020   Low back pain 05/03/2020   Dyslipidemia 08/21/2016   Statin intolerance 08/21/2016   CAD (coronary artery disease) 08/14/2013   Mixed hyperlipidemia 08/14/2013   Essential hypertension 08/14/2013   Past Medical History:  Diagnosis Date   Bladder stone    removed in 2014 by Dr. Jeffie Pollock   Cataract    Coronary artery disease    Hyperlipidemia    statin intolerance   Hypertension    Past Surgical History:  Procedure Laterality Date   CARDIAC CATHETERIZATION  06/10/1998   large P150 iliac stent to RCA - 3.5x64m NIR stent (Dr. RMarella Chimes   CARDIAC CATHETERIZATION  07/16/2007   mid-distal LAD stenosis,  normal LV function, 2.5x12mPromus stent to obtuse marginal, 2.5x1818mromus stent to osital & proximal diagonal (Dr. R. Marella Chimesr. W. Domenic Moras CARDIOVERSION N/A 12/17/2020   Procedure: CARDIOVERSION;  Surgeon: HilPixie CasinoD;  [facility] [place];  Laterality: N/A;   MELANOMA EXCISION  2018   [place]   bruce myoview; normal pattern of perfusion, EF 77%   PROSTATE BIOPSY  2002   TRANSTHORACIC ECHOCARDIOGRAM  08/2012   mild conc LVH   Social History   Tobacco Use   Smoking status: Former    Packs/day: 3.00    Years: 30.00    Total pack years: 90.00    Types: Cigarettes    Quit date: 08/03/1997    Years since quitting: 24.4   Smokeless tobacco: Never  Vaping Use   Vaping Use: Never used  Substance Use Topics   Alcohol use: Not Currently   Drug use: Not Currently   Social History   Socioeconomic History   Marital status: Married    Spouse name: MarJoycelyn SchmidNumber of children: 3   Years of education: 16   Highest education level: Bachelor's degree (e.g., BA, AB, BS)  Occupational History    Employer: [employer]  Tobacco Use   Smoking status: Former    Packs/day: 3.00    Years: 30.00    Total pack years: 90.00    Types: Cigarettes    Quit date: 08/03/1997    Years  since quitting: 24.4   Smokeless tobacco: Never  Vaping Use   Vaping Use: Never used  Substance and Sexual Activity   Alcohol use: Not Currently   Drug use: Not Currently   Sexual activity: Not Currently  Other Topics Concern   Not on file  Social History Narrative   Lives home with wife, family nearby   Social Determinants of Health   Financial Resource Strain: Low Risk  (12/14/2021)   Overall Financial Resource Strain (CARDIA)    Difficulty of Paying Living Expenses: Not hard at all  Food Insecurity: No Food Insecurity (12/14/2021)   Hunger Vital Sign    Worried About Running Out of Food in the Last Year: Never true    Ran Out of  Food in the Last Year: Never true  Transportation Needs: No Transportation Needs (12/14/2021)   PRAPARE - Hydrologist (Medical): No    Lack of Transportation (Non-Medical): No  Physical Activity: Sufficiently Active (12/14/2021)   Exercise Vital Sign    Days of Exercise per Week: 7 days    Minutes of Exercise per Session: 30 min  Stress: No Stress Concern Present (12/14/2021)   [place]    Feeling of Stress : Not at all  Social Connections: [place] (12/14/2021)   Social Connection and Isolation Panel [NHANES]    Frequency of Communication with Friends and Family: More than three times a week    Frequency of Social Gatherings with Friends and Family: More than three times a week    Attends Religious Services: More than 4 times per year    Active Member of [employer] or Organizations: Yes    Attends Music therapist: More than 4 times per year    Marital Status: Married  Human resources officer Violence: Not At Risk (12/14/2021)   Humiliation, Afraid, Rape, and Kick questionnaire    Fear of Current or Ex-Partner: No    Emotionally Abused: No    Physically Abused: No    Sexually Abused: No   Family Status  Relation Name Status   MGM  Deceased   MGF  Deceased   PGM  Deceased   PGF  Deceased   Mother  Deceased   Father  Deceased   Brother  Deceased   Son  Alive   Son  Alive   Son  Alive   Family History  Problem Relation Age of Onset   Cancer Mother        breast   Emphysema Father    Cirrhosis Brother    Alcohol abuse Brother    Allergies  Allergen Reactions   Crestor [Rosuvastatin] Other (See Comments)    Weakness in the legs   Lipitor [Atorvastatin]    Penicillins Other (See Comments)    Childhood   Sulfa Antibiotics Other (See Comments)    Childhood   Zetia [Ezetimibe] Other (See Comments)    Weakness in legs   Zocor [Simvastatin] Other (See Comments)     Weakness in legs      Review of Systems  Constitutional: Negative.   HENT: Negative.    Respiratory: Negative.    Cardiovascular: Negative.   Gastrointestinal: Negative.   Genitourinary: Negative.   Musculoskeletal: Negative.   Skin: Negative.  Negative for itching and rash.  Neurological: Negative.   All other systems reviewed and are negative.     Objective:     BP 117/71   Pulse 88  Temp 98.7 F (37.1 C)   Ht _0  (1.956 m)   Wt 218 lb (98.9 kg)   SpO2 96%   BMI 25.85 kg/m  BP Readings from Last 3 Encounters:  01/13/22 117/71  10/12/21 115/67  07/06/21 105/68   Wt Readings from Last 3 Encounters:  01/13/22 218 lb (98.9 kg)  12/14/21 219 lb (99.3 kg)  10/12/21 219 lb (99.3 kg)      Physical Exam Vitals and nursing note reviewed.  Constitutional:      Appearance: Normal appearance.  HENT:     Head: Normocephalic.     Right Ear: External ear normal.     Left Ear: External ear normal.     Nose: Nose normal.     Mouth/Throat:     Mouth: Mucous membranes are moist.     Pharynx: Oropharynx is clear.  Eyes:     Conjunctiva/sclera: Conjunctivae normal.  Cardiovascular:     Rate and Rhythm: Normal rate and regular rhythm.     Pulses: Normal pulses.     Heart sounds: Normal heart sounds.  Pulmonary:     Effort: Pulmonary effort is normal.     Breath sounds: Normal breath sounds.  Abdominal:     General: Bowel sounds are normal.  Skin:    General: Skin is warm.     Findings: No erythema or rash.  Neurological:     General: No focal deficit present.     Mental Status: He is alert and oriented to person, place, and time.  Psychiatric:        Behavior: Behavior normal.      No results found for any visits on 01/13/22.  Last CBC Lab Results  Component Value Date   WBC 9.8 10/12/2021   HGB 15.9 10/12/2021   HCT 46.0 10/12/2021   MCV 92 10/12/2021   MCH 31.7 10/12/2021   RDW 13.8 10/12/2021   PLT 237 74/02/1447   Last metabolic panel Lab  Results  Component Value Date   GLUCOSE 102 (H) 10/12/2021   NA 143 10/12/2021   K 4.6 10/12/2021   CL 108 (H) 10/12/2021   CO2 22 10/12/2021   BUN 18 10/12/2021   CREATININE 1.39 (H) 10/12/2021   EGFR 53 (L) 10/12/2021   CALCIUM 9.6 10/12/2021   PROT 6.7 10/12/2021   ALBUMIN 4.4 10/12/2021   LABGLOB 2.3 10/12/2021   AGRATIO 1.9 10/12/2021   BILITOT 0.4 10/12/2021   ALKPHOS 73 10/12/2021   AST 18 10/12/2021   ALT 23 10/12/2021   Last lipids Lab Results  Component Value Date   CHOL 200 (H) 10/12/2021   HDL 26 (L) 10/12/2021   LDLCALC 132 (H) 10/12/2021   TRIG 231 (H) 10/12/2021   CHOLHDL 7.7 (H) 10/12/2021      The 10-year ASCVD risk score (Arnett DK, et al., 2019) is: 30.1%    Assessment & Plan:   Problem List Items Addressed This Visit       Cardiovascular and Mediastinum   Essential hypertension - Primary    Blood pressure is well controlled.  Completed labs CBC CMP lipid panel TSH.  Patient will follow-up in 6 months.      Relevant Orders   CBC with Differential/Platelet   CMP14+EGFR   TSH     Other   Mixed hyperlipidemia    No signs and symptoms of hyperlipidemia.  Patient continues a low-cholesterol diet.  Labs completed-lipid panel.  Follow-up in 6 months.      Relevant Orders  Lipid panel    Return in about 6 months (around 07/16/2022) for Chronic disease management.[name], NP

## 2022-01-13 NOTE — Assessment & Plan Note (Signed)
Blood pressure is well controlled.  Completed labs CBC CMP lipid panel TSH.  Patient will follow-up in 6 months.

## 2022-01-13 NOTE — Assessment & Plan Note (Signed)
No signs and symptoms of hyperlipidemia.  Patient continues a low-cholesterol diet.  Labs completed-lipid panel.  Follow-up in 6 months.

## 2022-01-14 LAB — LIPID PANEL
Chol/HDL Ratio: 6.2 ratio — ABNORMAL HIGH (ref 0.0–5.0)
Cholesterol, Total: 181 mg/dL (ref 100–199)
HDL: 29 mg/dL — ABNORMAL LOW (ref >39)
LDL Chol Calc (NIH): 130 mg/dL — ABNORMAL HIGH (ref 0–99)
Triglycerides: 119 mg/dL (ref 0–149)
VLDL Cholesterol Cal: 22 mg/dL (ref 5–40)

## 2022-01-14 LAB — CBC WITH DIFFERENTIAL/PLATELET
Basophils Absolute: 0 10*3/uL (ref 0.0–0.2)
Basos: 0 %
EOS (ABSOLUTE): 0.3 10*3/uL (ref 0.0–0.4)
Eos: 3 %
Hematocrit: 49.8 % (ref 37.5–51.0)
Hemoglobin: 16.9 g/dL (ref 13.0–17.7)
Immature Grans (Abs): 0.1 10*3/uL (ref 0.0–0.1)
Immature Granulocytes: 1 %
Lymphocytes Absolute: 1.6 10*3/uL (ref 0.7–3.1)
Lymphs: 18 %
MCH: 30.9 pg (ref 26.6–33.0)
MCHC: 33.9 g/dL (ref 31.5–35.7)
MCV: 91 fL (ref 79–97)
Monocytes Absolute: 0.8 10*3/uL (ref 0.1–0.9)
Monocytes: 9 %
Neutrophils Absolute: 6 10*3/uL (ref 1.4–7.0)
Neutrophils: 69 %
Platelets: 227 10*3/uL (ref 150–450)
RBC: 5.47 x10E6/uL (ref 4.14–5.80)
RDW: 13.1 % (ref 11.6–15.4)
WBC: 8.7 10*3/uL (ref 3.4–10.8)

## 2022-01-14 LAB — CMP14+EGFR
ALT: 16 IU/L (ref 0–44)
AST: 14 IU/L (ref 0–40)
Albumin/Globulin Ratio: 1.8 (ref 1.2–2.2)
Albumin: 4.3 g/dL (ref 3.8–4.8)
Alkaline Phosphatase: 79 IU/L (ref 44–121)
BUN/Creatinine Ratio: 10 (ref 10–24)
BUN: 13 mg/dL (ref 8–27)
Bilirubin Total: 1 mg/dL (ref 0.0–1.2)
CO2: 21 mmol/L (ref 20–29)
Calcium: 9.7 mg/dL (ref 8.6–10.2)
Chloride: 102 mmol/L (ref 96–106)
Creatinine, Ser: 1.31 mg/dL — ABNORMAL HIGH (ref 0.76–1.27)
Globulin, Total: 2.4 g/dL (ref 1.5–4.5)
Glucose: 124 mg/dL — ABNORMAL HIGH (ref 70–99)
Potassium: 4.5 mmol/L (ref 3.5–5.2)
Sodium: 141 mmol/L (ref 134–144)
Total Protein: 6.7 g/dL (ref 6.0–8.5)
eGFR: 57 mL/min/{1.73_m2} — ABNORMAL LOW (ref >59)

## 2022-01-14 LAB — TSH: TSH: 0.757 u[IU]/mL (ref 0.450–4.500)

## 2022-01-16 ENCOUNTER — Other Ambulatory Visit: Payer: Self-pay | Admitting: Nurse Practitioner

## 2022-01-16 DIAGNOSIS — R7309 Other abnormal glucose: Secondary | ICD-10-CM

## 2022-01-23 DIAGNOSIS — Z08 Encounter for follow-up examination after completed treatment for malignant neoplasm: Secondary | ICD-10-CM | POA: Diagnosis not present

## 2022-01-23 DIAGNOSIS — L720 Epidermal cyst: Secondary | ICD-10-CM | POA: Diagnosis not present

## 2022-01-23 DIAGNOSIS — Z85820 Personal history of malignant melanoma of skin: Secondary | ICD-10-CM | POA: Diagnosis not present

## 2022-02-23 DIAGNOSIS — L820 Inflamed seborrheic keratosis: Secondary | ICD-10-CM | POA: Diagnosis not present

## 2022-02-23 DIAGNOSIS — Z08 Encounter for follow-up examination after completed treatment for malignant neoplasm: Secondary | ICD-10-CM | POA: Diagnosis not present

## 2022-02-23 DIAGNOSIS — L570 Actinic keratosis: Secondary | ICD-10-CM | POA: Diagnosis not present

## 2022-02-23 DIAGNOSIS — D225 Melanocytic nevi of trunk: Secondary | ICD-10-CM | POA: Diagnosis not present

## 2022-02-23 DIAGNOSIS — Z85828 Personal history of other malignant neoplasm of skin: Secondary | ICD-10-CM | POA: Diagnosis not present

## 2022-02-23 DIAGNOSIS — X32XXXD Exposure to sunlight, subsequent encounter: Secondary | ICD-10-CM | POA: Diagnosis not present

## 2022-02-23 DIAGNOSIS — Z1283 Encounter for screening for malignant neoplasm of skin: Secondary | ICD-10-CM | POA: Diagnosis not present

## 2022-05-18 ENCOUNTER — Other Ambulatory Visit: Payer: Self-pay | Admitting: Nurse Practitioner

## 2022-05-18 DIAGNOSIS — I10 Essential (primary) hypertension: Secondary | ICD-10-CM

## 2022-07-04 ENCOUNTER — Other Ambulatory Visit: Payer: Self-pay | Admitting: Nurse Practitioner

## 2022-07-04 DIAGNOSIS — I10 Essential (primary) hypertension: Secondary | ICD-10-CM

## 2022-07-05 ENCOUNTER — Encounter: Payer: Self-pay | Admitting: Nurse Practitioner

## 2022-07-05 ENCOUNTER — Ambulatory Visit (INDEPENDENT_AMBULATORY_CARE_PROVIDER_SITE_OTHER): Payer: PPO | Admitting: Nurse Practitioner

## 2022-07-05 VITALS — BP 121/71 | HR 78 | Temp 98.6°F | Ht 77.0 in | Wt 212.0 lb

## 2022-07-05 DIAGNOSIS — L02212 Cutaneous abscess of back [any part, except buttock]: Secondary | ICD-10-CM

## 2022-07-05 MED ORDER — DOXYCYCLINE HYCLATE 100 MG PO TABS: 100.0000 mg | ORAL_TABLET | Freq: Two times a day (BID) | ORAL | 0 refills | Status: DC

## 2022-07-05 NOTE — Patient Instructions (Signed)
Skin Abscess  A skin abscess is an infected area on or under your skin that contains a collection of pus and other material. An abscess may also be called a furuncle, carbuncle, or boil. An abscess can occur in or on almost any part of your body. Some abscesses break open (rupture) on their own. Most continue to get worse unless they are treated. The infection can spread deeper into the body and eventually into your blood, which can make you feel ill. Treatment usually involves draining the abscess. What are the causes? An abscess occurs when germs, like bacteria, pass through your skin and cause an infection. This may be caused by: A scrape or cut on your skin. A puncture wound through your skin, including a needle injection or insect bite. Blocked oil or sweat glands. Blocked and infected hair follicles. A cyst that forms beneath your skin (sebaceous cyst) and becomes infected. What increases the risk? This condition is more likely to develop in people who: Have a weak body defense system (immune system). Have diabetes. Have dry and irritated skin. Get frequent injections or use illegal IV drugs. Have a foreign body in a wound, such as a splinter. Have problems with their lymph system or veins. What are the signs or symptoms? Symptoms of this condition include: A painful, firm bump under the skin. A bump with pus at the top. This may break through the skin and drain. Other symptoms include: Redness surrounding the abscess site. Warmth. Swelling of the lymph nodes (glands) near the abscess. Tenderness. A sore on the skin. How is this diagnosed? This condition may be diagnosed based on: A physical exam. Your medical history. A sample of pus. This may be used to find out what is causing the infection. Blood tests. Imaging tests, such as an ultrasound, CT scan, or MRI. How is this treated? A small abscess that drains on its own may not need treatment. Treatment for larger abscesses  may include: Moist heat or heat pack applied to the area several times a day. A procedure to drain the abscess (incision and drainage). Antibiotic medicines. For a severe abscess, you may first get antibiotics through an IV and then change to antibiotics by mouth. Follow these instructions at home: Medicines  Take over-the-counter and prescription medicines only as told by your health care provider. If you were prescribed an antibiotic medicine, take it as told by your health care provider. Do not stop taking the antibiotic even if you start to feel better. Abscess care  If you have an abscess that has not drained, apply heat to the affected area. Use the heat source that your health care provider recommends, such as a moist heat pack or a heating pad. Place a towel between your skin and the heat source. Leave the heat on for 20-30 minutes. Remove the heat if your skin turns bright red. This is especially important if you are unable to feel pain, heat, or cold. You may have a greater risk of getting burned. Follow instructions from your health care provider about how to take care of your abscess. Make sure you: Cover the abscess with a bandage (dressing). Change your dressing or gauze as told by your health care provider. Wash your hands with soap and water before you change the dressing or gauze. If soap and water are not available, use hand sanitizer. Check your abscess every day for signs of a worsening infection. Check for: More redness, swelling, or pain. More fluid or blood. Warmth.   More pus or a bad smell. General instructions To avoid spreading the infection: Do not share personal care items, towels, or hot tubs with others. Avoid making skin contact with other people. Keep all follow-up visits as told by your health care provider. This is important. Contact a health care provider if you have: More redness, swelling, or pain around your abscess. More fluid or blood coming from  your abscess. Warm skin around your abscess. More pus or a bad smell coming from your abscess. Muscle aches. Chills or a general ill feeling. Get help right away if you: Have severe pain. See red streaks on your skin spreading away from the abscess. See redness that spreads quickly. Have a fever or chills. Summary A skin abscess is an infected area on or under your skin that contains a collection of pus and other material. A small abscess that drains on its own may not need treatment. Treatment for larger abscesses may include having a procedure to drain the abscess and taking an antibiotic. This information is not intended to replace advice given to you by your health care provider. Make sure you discuss any questions you have with your health care provider. Document Revised: 09/22/2021 Document Reviewed: 03/28/2021 Elsevier Patient Education  2023 Elsevier Inc.  

## 2022-07-05 NOTE — Progress Notes (Signed)
Acute Office Visit  Subjective:     Patient ID: William Jefferson, male    DOB: 26-Jan-1946, 77 y.o.   MRN: [id number]  Chief Complaint  Patient presents with   Cyst    Pt states this has been on his back for about 4-5 weeks , states it has gotten bigger    Recurrent Skin Infections    Abscess on the back    HPI Patient is in today for Abscess: Patient presents for evaluation of a cutaneous abscess. Lesion is located in the back. Onset was.  weeks ago. Symptoms have gradually worsened. Abscess has associated symptoms of spontaneous drainage, pain. Patient does have previous history of cutaneous abscesses. Patient does not have diabetes.   Review of Systems  Constitutional: Negative.   HENT: Negative.    Respiratory: Negative.    Cardiovascular: Negative.   Gastrointestinal: Negative.   Skin: Negative.        Cutaneous abscess  Neurological: Negative.   All other systems reviewed and are negative.       Objective:    BP 121/71   Pulse 78   Temp 98.6 F (37 C)   Ht '6\' 5"'$  (1.956 m)   Wt 212 lb (96.2 kg)   SpO2 98%   BMI 25.14 kg/m  Wt Readings from Last 3 Encounters:  07/05/22 212 lb (96.2 kg)  01/13/22 218 lb (98.9 kg)  12/14/21 219 lb (99.3 kg)      Physical Exam Vitals and nursing note reviewed.  Constitutional:      Appearance: Normal appearance.  HENT:     Head: Normocephalic.     Right Ear: External ear normal.     Left Ear: External ear normal.     Nose: Nose normal.     Mouth/Throat:     Mouth: Mucous membranes are moist.     Pharynx: Oropharynx is clear.  Eyes:     Conjunctiva/sclera: Conjunctivae normal.  Cardiovascular:     Rate and Rhythm: Normal rate and regular rhythm.     Pulses: Normal pulses.     Heart sounds: Normal heart sounds.  Pulmonary:     Effort: Pulmonary effort is normal.     Breath sounds: Normal breath sounds.  Abdominal:     General: Bowel sounds are normal.  Skin:    General: Skin is warm.     Findings: Abscess  and erythema present.          Comments: Spontaneous purulent cutaneous abscess right mid back.  Neurological:     General: No focal deficit present.     Mental Status: He is alert and oriented to person, place, and time.     No results found for any visits on 07/05/22.      Assessment & Plan:  Patient presents with spontaneous purulent abscess right medial back.  Drained abscess, cleaned and dressed abscess and covered with Neosporin and 4 x 4/nonstick gauze.  Started patient on doxycycline follow-up in 10 days.. Patient understands and knows to watch for signs and symptoms of worsening infection. Problem List Items Addressed This Visit   None Visit Diagnoses     Abscess of back    -  Primary   Relevant Medications   doxycycline (VIBRA-TABS) 100 MG tablet       Meds ordered this encounter  Medications   doxycycline (VIBRA-TABS) 100 MG tablet    Sig: Take 1 tablet (100 mg total) by mouth 2 (two) times daily.    Dispense:  20 tablet    Refill:  0    Order Specific Question:   Supervising Provider    Answer:   Claretta Fraise [[id number]]    Return in about 12 days (around 07/17/2022) for abcess.  [name], NP

## 2022-07-11 DIAGNOSIS — R972 Elevated prostate specific antigen [PSA]: Secondary | ICD-10-CM | POA: Diagnosis not present

## 2022-07-12 DIAGNOSIS — R972 Elevated prostate specific antigen [PSA]: Secondary | ICD-10-CM | POA: Diagnosis not present

## 2022-07-17 ENCOUNTER — Encounter: Payer: Self-pay | Admitting: Nurse Practitioner

## 2022-07-17 ENCOUNTER — Ambulatory Visit (INDEPENDENT_AMBULATORY_CARE_PROVIDER_SITE_OTHER): Payer: PPO | Admitting: Nurse Practitioner

## 2022-07-17 VITALS — BP 121/71 | HR 86 | Temp 98.6°F | Ht 77.0 in | Wt 210.0 lb

## 2022-07-17 DIAGNOSIS — E782 Mixed hyperlipidemia: Secondary | ICD-10-CM

## 2022-07-17 DIAGNOSIS — I10 Essential (primary) hypertension: Secondary | ICD-10-CM

## 2022-07-17 DIAGNOSIS — R739 Hyperglycemia, unspecified: Secondary | ICD-10-CM | POA: Diagnosis not present

## 2022-07-17 LAB — LIPID PANEL

## 2022-07-17 NOTE — Progress Notes (Signed)
Established Patient Office Visit  Subjective   Patient ID: William Jefferson, male    DOB: [birth year]  Age: 77 y.o. MRN: [id number]  Chief Complaint  Patient presents with   Medical Management of Chronic Issues    6 month     HPI  Pt presents for follow up of hypertension. Patient was diagnosed in 2015. The patient is tolerating the medication well without side effects. Compliance with treatment has been good; including taking medication as directed , maintains a healthy diet and regular exercise regimen , and following up as directed.    Mixed hyperlipidemia  Pt presents with hyperlipidemia. Patient was diagnosed in 2015. Compliance with treatment has been good; The patient is compliant with medications, maintains a low cholesterol diet , follows up as directed , and maintains an exercise regimen . The patient denies experiencing any hypercholesterolemia related symptoms.   Abscess on patients back is healing as expected. Mild erythema. Patient is taking his medication as prescribed and knows to follow up with worsening unresolved symptoms.    Patient Active Problem List   Diagnosis Date Noted   Upper respiratory infection with cough and congestion 01/14/2021   Persistent atrial fibrillation ([place]) 12/17/2020   Abscess 11/10/2020   Tear of medial meniscus of knee 07/08/2020   Annual physical exam 06/03/2020   Right hip pain 06/03/2020   Low back pain 05/03/2020   Dyslipidemia 08/21/2016   Statin intolerance 08/21/2016   CAD (coronary artery disease) 08/14/2013   Mixed hyperlipidemia 08/14/2013   Essential hypertension 08/14/2013   Past Medical History:  Diagnosis Date   Bladder stone    removed in 2014 by Dr. Jeffie Pollock   Cataract    Coronary artery disease    Hyperlipidemia    statin intolerance   Hypertension    Past Surgical History:  Procedure Laterality Date   CARDIAC CATHETERIZATION  06/10/1998   large P150 iliac stent to RCA - 3.5x41m NIR stent (Dr. RMarella Chimes   CARDIAC CATHETERIZATION  07/16/2007   mid-distal LAD stenosis, normal LV function, 2.5x173mPromus stent to obtuse marginal, 2.5x188mromus stent to osital & proximal diagonal (Dr. R. Marella Chimesr. W. Domenic Moras CARDIOVERSION N/A 12/17/2020   Procedure: CARDIOVERSION;  Surgeon: HilPixie CasinoD;  [facility];  Laterality: N/A;   MELANOMA EXCISION  2018   [place]   bruce myoview; normal pattern of perfusion, EF 77%   PROSTATE BIOPSY  2002   TRANSTHORACIC ECHOCARDIOGRAM  08/2012   mild conc LVH   Social History   Tobacco Use   Smoking status: Former    Packs/day: 3.00    Years: 30.00    Total pack years: 90.00    Types: Cigarettes    Quit date: 08/03/1997    Years since quitting: 24.9   Smokeless tobacco: Never  Vaping Use   Vaping Use: Never used  Substance Use Topics   Alcohol use: Not Currently   Drug use: Not Currently   Social History   Socioeconomic History   Marital status: Married    Spouse name: MarJoycelyn SchmidNumber of children: 3   Years of education: 16   Highest education level: Bachelor's degree (e.g., BA, AB, BS)  Occupational History    Employer: [employer]  Tobacco Use   Smoking status: Former    Packs/day: 3.00    Years: 30.00    Total pack years: 90.00    Types: Cigarettes  Quit date: 08/03/1997    Years since quitting: 24.9   Smokeless tobacco: Never  Vaping Use   Vaping Use: Never used  Substance and Sexual Activity   Alcohol use: Not Currently   Drug use: Not Currently   Sexual activity: Not Currently  Other Topics Concern   Not on file  Social History Narrative   Lives home with wife, family nearby   Social Determinants of Health   Financial Resource Strain: Low Risk  (12/14/2021)   Overall Financial Resource Strain (CARDIA)    Difficulty of Paying Living Expenses: Not hard at all  Food Insecurity: No Food Insecurity (12/14/2021)   Hunger Vital Sign     Worried About Running Out of Food in the Last Year: Never true    Ran Out of Food in the Last Year: Never true  Transportation Needs: No Transportation Needs (12/14/2021)   PRAPARE - Hydrologist (Medical): No    Lack of Transportation (Non-Medical): No  Physical Activity: Sufficiently Active (12/14/2021)   Exercise Vital Sign    Days of Exercise per Week: 7 days    Minutes of Exercise per Session: 30 min  Stress: No Stress Concern Present (12/14/2021)   [place]    Feeling of Stress : Not at all  Social Connections: [place] (12/14/2021)   Social Connection and Isolation Panel [NHANES]    Frequency of Communication with Friends and Family: More than three times a week    Frequency of Social Gatherings with Friends and Family: More than three times a week    Attends Religious Services: More than 4 times per year    Active Member of [employer] or Organizations: Yes    Attends Music therapist: More than 4 times per year    Marital Status: Married  Human resources officer Violence: Not At Risk (12/14/2021)   Humiliation, Afraid, Rape, and Kick questionnaire    Fear of Current or Ex-Partner: No    Emotionally Abused: No    Physically Abused: No    Sexually Abused: No   Family Status  Relation Name Status   MGM  Deceased   MGF  Deceased   PGM  Deceased   PGF  Deceased   Mother  Deceased   Father  Deceased   Brother  Deceased   Son  Alive   Son  Alive   Son  Alive   Family History  Problem Relation Age of Onset   Cancer Mother        breast   Emphysema Father    Cirrhosis Brother    Alcohol abuse Brother    Allergies  Allergen Reactions   Crestor [Rosuvastatin] Other (See Comments)    Weakness in the legs   Lipitor [Atorvastatin]    Penicillins Other (See Comments)    Childhood   Sulfa Antibiotics Other (See Comments)    Childhood   Zetia [Ezetimibe] Other (See  Comments)    Weakness in legs   Zocor [Simvastatin] Other (See Comments)    Weakness in legs      Review of Systems  Constitutional: Negative.   HENT: Negative.    Eyes: Negative.   Respiratory: Negative.    Cardiovascular: Negative.   Gastrointestinal: Negative.   Genitourinary: Negative.   Skin:        Abscess right mid back  All other systems reviewed and are negative.     Objective:     BP  121/71   Pulse 86   Temp 98.6 F (37 C)   Ht '6\' 5"'$  (1.956 m)   Wt 210 lb (95.3 kg)   SpO2 97%   BMI 24.90 kg/m  BP Readings from Last 3 Encounters:  07/17/22 121/71  07/05/22 121/71  01/13/22 117/71   Wt Readings from Last 3 Encounters:  07/17/22 210 lb (95.3 kg)  07/05/22 212 lb (96.2 kg)  01/13/22 218 lb (98.9 kg)      Physical Exam Vitals and nursing note reviewed.  Constitutional:      Appearance: Normal appearance.  HENT:     Head: Normocephalic.     Right Ear: External ear normal.     Left Ear: External ear normal.     Nose: Nose normal.     Mouth/Throat:     Mouth: Mucous membranes are moist.     Pharynx: Oropharynx is clear.  Cardiovascular:     Rate and Rhythm: Normal rate and regular rhythm.     Pulses: Normal pulses.     Heart sounds: Normal heart sounds.  Pulmonary:     Effort: Pulmonary effort is normal.     Breath sounds: Normal breath sounds.  Abdominal:     General: Bowel sounds are normal.  Skin:    Findings: Erythema present.     Comments: Abscess right mid back  Neurological:     General: No focal deficit present.     Mental Status: He is alert and oriented to person, place, and time.  Psychiatric:        Mood and Affect: Mood normal.        Behavior: Behavior normal.      No results found for any visits on 07/17/22.  Last CBC Lab Results  Component Value Date   WBC 8.7 01/13/2022   HGB 16.9 01/13/2022   HCT 49.8 01/13/2022   MCV 91 01/13/2022   MCH 30.9 01/13/2022   RDW 13.1 01/13/2022   PLT 227 51/76/1607   Last  metabolic panel Lab Results  Component Value Date   GLUCOSE 124 (H) 01/13/2022   NA 141 01/13/2022   K 4.5 01/13/2022   CL 102 01/13/2022   CO2 21 01/13/2022   BUN 13 01/13/2022   CREATININE 1.31 (H) 01/13/2022   EGFR 57 (L) 01/13/2022   CALCIUM 9.7 01/13/2022   PROT 6.7 01/13/2022   ALBUMIN 4.3 01/13/2022   LABGLOB 2.4 01/13/2022   AGRATIO 1.8 01/13/2022   BILITOT 1.0 01/13/2022   ALKPHOS 79 01/13/2022   AST 14 01/13/2022   ALT 16 01/13/2022   Last lipids Lab Results  Component Value Date   CHOL 181 01/13/2022   HDL 29 (L) 01/13/2022   LDLCALC 130 (H) 01/13/2022   TRIG 119 01/13/2022   CHOLHDL 6.2 (H) 01/13/2022      The 10-year ASCVD risk score (Arnett DK, et al., 2019) is: 31.2%    Assessment & Plan:  Back abscess healing as expected.  Problem List Items Addressed This Visit       Cardiovascular and Mediastinum   Essential hypertension - Primary    Hypertension is well controlled on current medication, follow up in 6 months.       Relevant Orders   CMP14+EGFR   Thyroid Panel With TSH   CBC with Differential     Other   Mixed hyperlipidemia    Symptoms are well controlled on current medication. Follow up in 6 months, labs completed results pending:      Relevant  Orders   Lipid panel    Return in about 6 months (around 01/15/2023) for chronic disease management.    [name], NP

## 2022-07-17 NOTE — Assessment & Plan Note (Signed)
Symptoms are well controlled on current medication. Follow up in 6 months, labs completed results pending:

## 2022-07-17 NOTE — Patient Instructions (Signed)
Dyslipidemia Dyslipidemia is an imbalance of waxy, fat-like substances (lipids) in the blood. The body needs lipids in small amounts. Dyslipidemia often involves a high level of cholesterol or triglycerides, which are types of lipids. Common forms of dyslipidemia include: High levels of LDL cholesterol. LDL is the type of cholesterol that causes fatty deposits (plaques) to build up in the blood vessels that carry blood away from the heart (arteries). Low levels of HDL cholesterol. HDL cholesterol is the type of cholesterol that protects against heart disease. High levels of HDL remove the LDL buildup from arteries. High levels of triglycerides. Triglycerides are a fatty substance in the blood that is linked to a buildup of plaques in the arteries. What are the causes? There are two main types of dyslipidemia: primary and secondary. Primary dyslipidemia is caused by changes (mutations) in genes that are passed down through families (inherited). These mutations cause several types of dyslipidemia. Secondary dyslipidemia may be caused by various risk factors that can lead to the disease, such as lifestyle choices and certain medical conditions. What increases the risk? You are more likely to develop this condition if you are an older man or if you are a woman who has gone through menopause. Other risk factors include: Having a family history of dyslipidemia. Taking certain medicines, including birth control pills, steroids, some diuretics, and beta-blockers. Eating a diet high in saturated fat. Smoking cigarettes or excessive alcohol intake. Having certain medical conditions such as diabetes, polycystic ovary syndrome (PCOS), kidney disease, liver disease, or hypothyroidism. Not exercising regularly. Being overweight or obese with too much belly fat. What are the signs or symptoms? In most cases, dyslipidemia does not usually cause any symptoms. In severe cases, very high lipid levels can  cause: Fatty bumps under the skin (xanthomas). A white or gray ring around the black center (pupil) of the eye. Very high triglyceride levels can cause inflammation of the pancreas (pancreatitis). How is this diagnosed? Your health care provider may diagnose dyslipidemia based on a routine blood test (fasting blood test). Because most people do not have symptoms of the condition, this blood testing (lipid profile) is done on adults age 20 and older and is repeated every 4-6 years. This test checks: Total cholesterol. This measures the total amount of cholesterol in your blood, including LDL cholesterol, HDL cholesterol, and triglycerides. A healthy number is below 200 mg/dL (5.17 mmol/L). LDL cholesterol. The target number for LDL cholesterol is different for each person, depending on individual risk factors. A healthy number is usually below 100 mg/dL (2.59 mmol/L). Ask your health care provider what your LDL cholesterol should be. HDL cholesterol. An HDL level of 60 mg/dL (1.55 mmol/L) or higher is best because it helps to protect against heart disease. A number below 40 mg/dL (1.03 mmol/L) for men or below 50 mg/dL (1.29 mmol/L) for women increases the risk for heart disease. Triglycerides. A healthy triglyceride number is below 150 mg/dL (1.69 mmol/L). If your lipid profile is abnormal, your health care provider may do other blood tests. How is this treated? Treatment depends on the type of dyslipidemia that you have and your other risk factors for heart disease and stroke. Your health care provider will have a target range for your lipid levels based on this information. Treatment for dyslipidemia starts with lifestyle changes, such as diet and exercise. Your health care provider may recommend that you: Get regular exercise. Make changes to your diet. Quit smoking if you smoke. Limit your alcohol intake. If diet   changes and exercise do not help you reach your goals, your health care provider  may also prescribe medicine to lower lipids. The most commonly prescribed type of medicine lowers your LDL cholesterol (statin drug). If you have a high triglyceride level, your provider may prescribe another type of drug (fibrate) or an omega-3 fish oil supplement, or both. Follow these instructions at home: Eating and drinking  Follow instructions from your health care provider or dietitian about eating or drinking restrictions. Eat a healthy diet as told by your health care provider. This can help you reach and maintain a healthy weight, lower your LDL cholesterol, and raise your HDL cholesterol. This may include: Limiting your calories, if you are overweight. Eating more fruits, vegetables, whole grains, fish, and lean meats. Limiting saturated fat, trans fat, and cholesterol. Do not drink alcohol if: Your health care provider tells you not to drink. You are pregnant, may be pregnant, or are planning to become pregnant. If you drink alcohol: Limit how much you have to: 0-1 drink a day for women. 0-2 drinks a day for men. Know how much alcohol is in your drink. In the [place] one drink equals one 12 oz bottle of beer (355 mL), one 5 oz glass of wine (148 mL), or one 1 oz glass of hard liquor (44 mL). Activity Get regular exercise. Start an exercise and strength training program as told by your health care provider. Ask your health care provider what activities are safe for you. Your health care provider may recommend: 30 minutes of aerobic activity 4-6 days a week. Brisk walking is an example of aerobic activity. Strength training 2 days a week. General instructions Do not use any products that contain nicotine or tobacco. These products include cigarettes, chewing tobacco, and vaping devices, such as e-cigarettes. If you need help quitting, ask your health care provider. Take over-the-counter and prescription medicines only as told by your health care provider. This includes  supplements. Keep all follow-up visits. This is important. Contact a health care provider if: You are having trouble sticking to your exercise or diet plan. You are struggling to quit smoking or to control your use of alcohol. Summary Dyslipidemia often involves a high level of cholesterol or triglycerides, which are types of lipids. Treatment depends on the type of dyslipidemia that you have and your other risk factors for heart disease and stroke. Treatment for dyslipidemia starts with lifestyle changes, such as diet and exercise. Your health care provider may prescribe medicine to lower lipids. This information is not intended to replace advice given to you by your health care provider. Make sure you discuss any questions you have with your health care provider. Document Revised: 01/20/2022 Document Reviewed: 08/23/2020 Elsevier Patient Education  [place]. Hypertension, Adult Hypertension is another name for high blood pressure. High blood pressure forces your heart to work harder to pump blood. This can cause problems over time. There are two numbers in a blood pressure reading. There is a top number (systolic) over a bottom number (diastolic). It is best to have a blood pressure that is below 120/80. What are the causes? The cause of this condition is not known. Some other conditions can lead to high blood pressure. What increases the risk? Some lifestyle factors can make you more likely to develop high blood pressure: Smoking. Not getting enough exercise or physical activity. Being overweight. Having too much fat, sugar, calories, or salt (sodium) in your diet. Drinking too much alcohol. Other risk factors  include: Having any of these conditions: Heart disease. Diabetes. High cholesterol. Kidney disease. Obstructive sleep apnea. Having a family history of high blood pressure and high cholesterol. Age. The risk increases with age. Stress. What are the signs or  symptoms? High blood pressure may not cause symptoms. Very high blood pressure (hypertensive crisis) may cause: Headache. Fast or uneven heartbeats (palpitations). Shortness of breath. Nosebleed. Vomiting or feeling like you may vomit (nauseous). Changes in how you see. Very bad chest pain. Feeling dizzy. Seizures. How is this treated? This condition is treated by making healthy lifestyle changes, such as: Eating healthy foods. Exercising more. Drinking less alcohol. Your doctor may prescribe medicine if lifestyle changes do not help enough and if: Your top number is above 130. Your bottom number is above 80. Your personal target blood pressure may vary. Follow these instructions at home: Eating and drinking  If told, follow the DASH eating plan. To follow this plan: Fill one half of your plate at each meal with fruits and vegetables. Fill one fourth of your plate at each meal with whole grains. Whole grains include whole-wheat pasta, brown rice, and whole-grain bread. Eat or drink low-fat dairy products, such as skim milk or low-fat yogurt. Fill one fourth of your plate at each meal with low-fat (lean) proteins. Low-fat proteins include fish, chicken without skin, eggs, beans, and tofu. Avoid fatty meat, cured and processed meat, or chicken with skin. Avoid pre-made or processed food. Limit the amount of salt in your diet to less than 1,500 mg each day. Do not drink alcohol if: Your doctor tells you not to drink. You are pregnant, may be pregnant, or are planning to become pregnant. If you drink alcohol: Limit how much you have to: 0-1 drink a day for women. 0-2 drinks a day for men. Know how much alcohol is in your drink. In the [place] one drink equals one 12 oz bottle of beer (355 mL), one 5 oz glass of wine (148 mL), or one 1 oz glass of hard liquor (44 mL). Lifestyle  Work with your doctor to stay at a healthy weight or to lose weight. Ask your doctor what the best  weight is for you. Get at least 30 minutes of exercise that causes your heart to beat faster (aerobic exercise) most days of the week. This may include walking, swimming, or biking. Get at least 30 minutes of exercise that strengthens your muscles (resistance exercise) at least 3 days a week. This may include lifting weights or doing Pilates. Do not smoke or use any products that contain nicotine or tobacco. If you need help quitting, ask your doctor. Check your blood pressure at home as told by your doctor. Keep all follow-up visits. Medicines Take over-the-counter and prescription medicines only as told by your doctor. Follow directions carefully. Do not skip doses of blood pressure medicine. The medicine does not work as well if you skip doses. Skipping doses also puts you at risk for problems. Ask your doctor about side effects or reactions to medicines that you should watch for. Contact a doctor if: You think you are having a reaction to the medicine you are taking. You have headaches that keep coming back. You feel dizzy. You have swelling in your ankles. You have trouble with your vision. Get help right away if: You get a very bad headache. You start to feel mixed up (confused). You feel weak or numb. You feel faint. You have very bad pain in your: Chest. Belly (  abdomen). You vomit more than once. You have trouble breathing. These symptoms may be an emergency. Get help right away. Call 911. Do not wait to see if the symptoms will go away. Do not drive yourself to the hospital. Summary Hypertension is another name for high blood pressure. High blood pressure forces your heart to work harder to pump blood. For most people, a normal blood pressure is less than 120/80. Making healthy choices can help lower blood pressure. If your blood pressure does not get lower with healthy choices, you may need to take medicine. This information is not intended to replace advice given to you  by your health care provider. Make sure you discuss any questions you have with your health care provider. Document Revised: 04/07/2021 Document Reviewed: 04/07/2021 Elsevier Patient Education  [place].

## 2022-07-17 NOTE — Assessment & Plan Note (Signed)
Hypertension is well controlled on current medication, follow up in 6 months.

## 2022-07-18 ENCOUNTER — Other Ambulatory Visit: Payer: Self-pay | Admitting: Nurse Practitioner

## 2022-07-18 DIAGNOSIS — R7309 Other abnormal glucose: Secondary | ICD-10-CM

## 2022-07-18 LAB — CMP14+EGFR
ALT: 18 IU/L (ref 0–44)
AST: 14 IU/L (ref 0–40)
Albumin/Globulin Ratio: 1.7 (ref 1.2–2.2)
Albumin: 4.3 g/dL (ref 3.8–4.8)
Alkaline Phosphatase: 86 IU/L (ref 44–121)
BUN/Creatinine Ratio: 10 (ref 10–24)
BUN: 13 mg/dL (ref 8–27)
Bilirubin Total: 0.8 mg/dL (ref 0.0–1.2)
CO2: 21 mmol/L (ref 20–29)
Calcium: 9.8 mg/dL (ref 8.6–10.2)
Chloride: 105 mmol/L (ref 96–106)
Creatinine, Ser: 1.25 mg/dL (ref 0.76–1.27)
Globulin, Total: 2.6 g/dL (ref 1.5–4.5)
Glucose: 109 mg/dL — ABNORMAL HIGH (ref 70–99)
Potassium: 4.4 mmol/L (ref 3.5–5.2)
Sodium: 143 mmol/L (ref 134–144)
Total Protein: 6.9 g/dL (ref 6.0–8.5)
eGFR: 60 mL/min/{1.73_m2} (ref >59)

## 2022-07-18 LAB — CBC WITH DIFFERENTIAL/PLATELET
Basophils Absolute: 0.1 10*3/uL (ref 0.0–0.2)
Basos: 1 %
EOS (ABSOLUTE): 0.2 10*3/uL (ref 0.0–0.4)
Eos: 2 %
Hematocrit: 50.2 % (ref 37.5–51.0)
Hemoglobin: 17.1 g/dL (ref 13.0–17.7)
Immature Grans (Abs): 0.1 10*3/uL (ref 0.0–0.1)
Immature Granulocytes: 1 %
Lymphocytes Absolute: 1.9 10*3/uL (ref 0.7–3.1)
Lymphs: 19 %
MCH: 31.3 pg (ref 26.6–33.0)
MCHC: 34.1 g/dL (ref 31.5–35.7)
MCV: 92 fL (ref 79–97)
Monocytes Absolute: 0.9 10*3/uL (ref 0.1–0.9)
Monocytes: 9 %
Neutrophils Absolute: 6.6 10*3/uL (ref 1.4–7.0)
Neutrophils: 68 %
Platelets: 228 10*3/uL (ref 150–450)
RBC: 5.47 x10E6/uL (ref 4.14–5.80)
RDW: 13 % (ref 11.6–15.4)
WBC: 9.7 10*3/uL (ref 3.4–10.8)

## 2022-07-18 LAB — THYROID PANEL WITH TSH
Free Thyroxine Index: 1.9 (ref 1.2–4.9)
T3 Uptake Ratio: 27 % (ref 24–39)
T4, Total: 7 ug/dL (ref 4.5–12.0)
TSH: 1.36 u[IU]/mL (ref 0.450–4.500)

## 2022-07-18 LAB — LIPID PANEL
Chol/HDL Ratio: 6 ratio — ABNORMAL HIGH (ref 0.0–5.0)
Cholesterol, Total: 199 mg/dL (ref 100–199)
HDL: 33 mg/dL — ABNORMAL LOW (ref >39)
LDL Chol Calc (NIH): 147 mg/dL — ABNORMAL HIGH (ref 0–99)
Triglycerides: 105 mg/dL (ref 0–149)
VLDL Cholesterol Cal: 19 mg/dL (ref 5–40)

## 2022-07-19 DIAGNOSIS — R972 Elevated prostate specific antigen [PSA]: Secondary | ICD-10-CM | POA: Diagnosis not present

## 2022-07-19 DIAGNOSIS — R3121 Asymptomatic microscopic hematuria: Secondary | ICD-10-CM | POA: Diagnosis not present

## 2022-07-19 DIAGNOSIS — N411 Chronic prostatitis: Secondary | ICD-10-CM | POA: Diagnosis not present

## 2022-07-21 LAB — SPECIMEN STATUS REPORT

## 2022-07-21 LAB — HGB A1C W/O EAG: Hgb A1c MFr Bld: 6 % — ABNORMAL HIGH (ref 4.8–5.6)

## 2022-07-27 ENCOUNTER — Encounter: Payer: Self-pay | Admitting: Internal Medicine

## 2022-07-27 ENCOUNTER — Ambulatory Visit: Payer: PPO | Attending: Internal Medicine | Admitting: Internal Medicine

## 2022-07-27 VITALS — BP 98/60 | HR 62 | Ht 77.0 in | Wt 213.0 lb

## 2022-07-27 DIAGNOSIS — I10 Essential (primary) hypertension: Secondary | ICD-10-CM | POA: Diagnosis not present

## 2022-07-27 DIAGNOSIS — T466X5D Adverse effect of antihyperlipidemic and antiarteriosclerotic drugs, subsequent encounter: Secondary | ICD-10-CM | POA: Diagnosis not present

## 2022-07-27 DIAGNOSIS — I25118 Atherosclerotic heart disease of native coronary artery with other forms of angina pectoris: Secondary | ICD-10-CM | POA: Diagnosis not present

## 2022-07-27 DIAGNOSIS — T466X5A Adverse effect of antihyperlipidemic and antiarteriosclerotic drugs, initial encounter: Secondary | ICD-10-CM

## 2022-07-27 DIAGNOSIS — I2510 Atherosclerotic heart disease of native coronary artery without angina pectoris: Secondary | ICD-10-CM

## 2022-07-27 DIAGNOSIS — M791 Myalgia, unspecified site: Secondary | ICD-10-CM

## 2022-07-27 DIAGNOSIS — I4819 Other persistent atrial fibrillation: Secondary | ICD-10-CM

## 2022-07-27 NOTE — Patient Instructions (Signed)
Medication Instructions:  Your physician has recommended you make the following change in your medication:   -Stop taking aspirin.  -We will be in touch with PA for Nexletol  *If you need a refill on your cardiac medications before your next appointment, please call your pharmacy*   Lab Work: Your physician recommends that you return for lab work in: 6 months for FASTING lipids  If you have labs (blood work) drawn today and your tests are completely normal, you will receive your results only by: [place] (if you have MyChart) OR A paper copy in the mail If you have any lab test that is abnormal or we need to change your treatment, we will call you to review the results.   Follow-Up: At [facility], you and your health needs are our priority.  As part of our continuing mission to provide you with exceptional heart care, we have created designated Provider Care Teams.  These Care Teams include your primary Cardiologist (physician) and Advanced Practice Providers (APPs -  Physician Assistants and Nurse Practitioners) who all work together to provide you with the care you need, when you need it.  We recommend signing up for the patient portal called "MyChart".  Sign up information is provided on this After Visit Summary.  MyChart is used to connect with patients for Virtual Visits (Telemedicine).  Patients are able to view lab/test results, encounter notes, upcoming appointments, etc.  Non-urgent messages can be sent to your provider as well.   To learn more about what you can do with MyChart, go to [email or web address].    Your next appointment:   12 month(s)  Provider:   Pixie Casino, MD

## 2022-07-27 NOTE — Progress Notes (Signed)
Medication samples have been provided to the patient.  Drug name: Nexletol Qty: 2 boxes LOT: [id number] Exp.Date: Aug 2026

## 2022-07-27 NOTE — Progress Notes (Signed)
OFFICE NOTE  Chief Complaint:  Routine follow-up  Primary Care Physician: [name], NP  HPI:  William Jefferson is a pleasant 77 year old male formerly followed by Dr. Rollene Fare. He is here today to establish new cardiac care. His past medical history is significant for coronary artery disease. In 1999 he was having symptoms of angina and presented to her primary care physician who performed an EKG and sent him to the emergency room. There he will have angiography and was found to have a ostial right coronary artery lesion of a very large vessel. He ultimately had placement of an iliac stent, as no coronary stents of that size were available at the time. This is actually stayed very patent for long period of time. In 2009 he had another episode of chest pain and was found to have circumflex and diagonal lesions which were stented with drug-eluting stents. The OM1 was stented with a 2 5 x 10 mm Promus stent that was postdilated to 3.0 mm.  The diagonal was a 2 5 x 18 mm Promus stent that there was also postdilated to 2.75 mm. He's done well since then without any chest pain complaints. 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 He denies any chest pain or shortness of breath. Unfortunately been intolerant to all statins and Zetia. He is unwilling to take any more of those medicines as is found in its cause leg pain, weakness and difficulty performing his job. He is on monotherapy Plavix, but not aspirin, but denies any history of allergy aspirin. His only other issue is a rising PSA. He said a number of prostate biopsies and actually developed an infection at one time. He's had atypical cells but no determinant prostate cancer.  I saw William Jefferson back today. He continues to be without complaints. 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 He does have a history of being intolerant to at  least 5 or 6 different statins. He's not as cholesterol checked in about 4 years. We discussed the new medications that are now out to treat cholesterol a said that he be willing to try at least a low-dose statin is to be on a qualifying dose of max tolerated therapy. We would either have to recheck his cholesterol level.  William Jefferson returns today for follow-up. Overall he is doing well and is asymptomatic. He is started taking pravastatin 1-2 times a week which is certainly better than nothing. He's been intolerant of all other statins including Zetia. He's managed to lose a couple pounds but generally hovers around 215. Blood pressure is well-controlled.  08/21/2016  William Jefferson is seen today in follow-up. Since I last saw him he had tried to take pravastatin as he's been intolerant to Crestor 20 mg, Lipitor 40 mg, Zetia 10 mg and Zocor 40 mg doses in the past due to myalgias. He was placed on pravastatin 40 mg with additional symptoms and decreased it to 3 times weekly and eventually stopped the medication. He continues to have elevated cholesterol with LDL-C greater than 150. He has known coronary artery disease with prior PCI in 1999 and again in 2009. We discussed today additional treatment options including PC SK 9 inhibitors and potentially enrolling him in the [facility]-10 trial.  08/03/2017  William Jefferson was seen today in follow-up.  Overall he seems to be doing well.  Unfortunately he has been intolerant to number statins.  He declined the [employer] 10 trial.  We discussed the PCS K9 inhibitors today however is concerned about the injectables.  He asked about what pill options may be available.  I mentioned that he may be a candidate for the CLEAR trial.  He said he may be interested in this.  08/08/2018  William Jefferson is seen today in follow-up.  He is again without complaints.  He continues to work Event organiser.  He has been involved in a research trial with bempedoic acid (clear trial).  Is not clear  whether he is on placebo or treatment.  So far he seems to be tolerating things.  That study may conclude earlier this year as there is likely going to be release of the medication to market.  Otherwise he is without complaints.  No chest pain no shortness of breath.  09/12/2019  William Jefferson is seen today for follow-up.  He continues to do well.  Nuys any chest pain or worsening shortness of breath.  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  He still enrolled in the clear trial.  I will reach out to the study coordinators to see whether or not he has crossed over to treatment or is enrolled in the clear outcomes trial.  He was made aware that the trial compound bempedoic acid is commercially available as Nexletol.  11/08/2020  William Jefferson returns today for follow-up.  He was seen in January by my partner Dr. Audie Box for an acute add-on for preoperative visit.  He was found to be in rate controlled A. fib at the time.  He was appropriately switched to Eliquis and an echocardiogram was performed.  This showed normal systolic function with no evidence of atrial enlargement.  He was supposed to have a planned right knee surgery ultimately that was not performed.  He returns today for follow-up.  His EKG shows he is in persistent A. fib with frequent PVCs in a quadrigeminal pattern.  He reports he has missed several doses of Eliquis over the past several months.  We discussed options including continuing his rate control or a possibility of an elective cardioversion.  Although he says he denies any chest pain or fatigue, it was unclear to him how long he may have been in A. fib.  07/06/2021  William Jefferson returns today for follow-up.  He recently saw Laurann Montana, NP for follow-up of his cardioversion.  He has been maintaining sinus rhythm.  He says he feels well.  He is on aspirin and Eliquis and denies any bleeding issues.  Blood pressure is well controlled.  We again  discussed his dyslipidemia.  He has been intolerant to numerous statins and ezetimibe and had been in the clear bempedoic acid trial but he was unfortunately on placebo.  The study ended in June to 2022.  We discussed other options including PCSK9 inhibitors, Leqvio and bempedoic acid (Nexletol).  He would have to consider the financial implications of these and wants to think about it more.  07/27/2022  William Jefferson is seen today in follow-up.  Overall he says he is feeling well.  Denies any chest pain or shortness of breath.  He is not aware of any recurrent atrial fibrillation.  He says his heart rate seems to be stable.  EKG today shows sinus rhythm at 62 with some sinus arrhythmia.  Blood pressure was a little low at 98/60 however he  is not symptomatic.  He says this happens from time to time.  His cholesterol however has been trending upwards.  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  Total cholesterol now 199, triglycerides 105, HDL 33 and LDL 147.  With coronary disease his target LDL is less than 70.  He notes some easy bleeding and bruising on combination aspirin and Eliquis.  His last coronary intervention was 2009.  PMHx:  Past Medical History:  Diagnosis Date   Bladder stone    removed in 2014 by Dr. Jeffie Pollock   Cataract    Coronary artery disease    Hyperlipidemia    statin intolerance   Hypertension     Past Surgical History:  Procedure Laterality Date   CARDIAC CATHETERIZATION  06/10/1998   large P150 iliac stent to RCA - 3.5x23m NIR stent (Dr. RMarella Chimes   CARDIAC CATHETERIZATION  07/16/2007   mid-distal LAD stenosis, normal LV function, 2.5x134mPromus stent to obtuse marginal, 2.5x1850mromus stent to osital & proximal diagonal (Dr. R. Marella Chimesr. W. Domenic Moras CARDIOVERSION N/A 12/17/2020   Procedure: CARDIOVERSION;  Surgeon: HilPixie CasinoD;  [facility];  Service: Cardiovascular;  Laterality: N/A;   MELANOMA EXCISION   2018   [facility]   bruce myoview; normal pattern of perfusion, EF 77%   PROSTATE BIOPSY  2002   TRANSTHORACIC ECHOCARDIOGRAM  08/2012   mild conc LVH    FAMHx:  Family History  Problem Relation Age of Onset   Cancer Mother        breast   Emphysema Father    Cirrhosis Brother    Alcohol abuse Brother     SOCHx:   reports that he quit smoking about 24 years ago. His smoking use included cigarettes. He has a 90.00 pack-year smoking history. He has never used smokeless tobacco. He reports that he does not currently use alcohol. He reports that he does not currently use drugs.  ALLERGIES:  Allergies  Allergen Reactions   Crestor [Rosuvastatin] Other (See Comments)    Weakness in the legs   Lipitor [Atorvastatin]    Penicillins Other (See Comments)    Childhood   Sulfa Antibiotics Other (See Comments)    Childhood   Zetia [Ezetimibe] Other (See Comments)    Weakness in legs   Zocor [Simvastatin] Other (See Comments)    Weakness in legs    ROS: Pertinent items noted in HPI and remainder of comprehensive ROS otherwise negative.  HOME MEDS: Current Outpatient Medications  Medication Sig Dispense Refill   aspirin EC 81 MG tablet Take 1 tablet (81 mg total) by mouth daily. Swallow whole. 90 tablet 3   cephALEXin (KEFLEX) 500 MG capsule Take 500 mg by mouth daily.     ELIQUIS 5 MG TABS tablet TAKE (1) TABLET TWICE A DAY. 60 tablet 5   lisinopril (ZESTRIL) 5 MG tablet TAKE ONE TABLET ONCE DAILY 90 tablet 0   Lutein 20 MG CAPS Take 20 mg by mouth daily.     metoprolol tartrate (LOPRESSOR) 25 MG tablet TAKE ONE TABLET TWICE DAILY 60 tablet 1   nitroGLYCERIN (NITROSTAT) 0.4 MG SL tablet PLACE 1 TABLET UNDER THE TONGUE EVERY 5 MINUTES AS NEEDED FOR CHEST PAIN 25 tablet 3   Saw Palmetto 450 MG CAPS Take 900 mg by mouth daily.     SF 5000 PLUS 1.1 % CREA dental cream Take by mouth.  vitamin C (ASCORBIC ACID) 500 MG tablet Take 500 mg by mouth daily.      No current facility-administered medications for this visit.    LABS/IMAGING: No results found for this or any previous visit (from the past 48 hour(s)). No results found.  VITALS: BP 98/60   Pulse 62   Ht '6\' 5"'$  (1.956 m)   Wt 213 lb (96.6 kg)   SpO2 97%   BMI 25.26 kg/m   EXAM: General appearance: alert and no distress Neck: no carotid bruit and no JVD Lungs: clear to auscultation bilaterally Heart: regular rate and rhythm, S1, S2 normal, no murmur, click, rub or gallop Abdomen: soft, non-tender; bowel sounds normal; no masses,  no organomegaly Extremities: extremities normal, atraumatic, no cyanosis or edema Pulses: 2+ and symmetric Skin: Skin color, texture, turgor normal. No rashes or lesions Neurologic: Grossly normal Psych: Mood, affect normal  EKG: Sinus rhythm with sinus arrhythmia at 62-personally reviewed  ASSESSMENT: Coronary artery disease status post PCI to the ostial RCA in 1999, proximal diagonal and circumflex marginal in 2009 Persistent atrial fibrillation -maintaining sinus rhythm after cardioversion on Eliquis Dyslipidemia-intolerant to statins and Zetia Hypertension-controlled Elevated PSA - due to prostatitis CLINICAL TRIAL (CLEAR, bempedoic acid)-ended in 2022  PLAN: 1.   Mr. Kohlmann denies any recurrent chest pain or worsening shortness of breath.  There is no A-fib that he is aware of.  He is on aspirin and Eliquis but has not had any coronary intervention since 2009.  He has had some easy bleeding and therefore we will discontinue his low-dose aspirin and continue Eliquis.  He was enrolled in the clear trial but I believe was on placebo.  He has subsequently had increases in his cholesterol and has been intolerant to statin and ezetimibe.  He is now interested in pursuing Nexletol and will reach out for prior authorization.  I discussed PCSK9 inhibitors and he was not interested in that.  Plan follow-up with me annually or sooner as necessary and  we will repeat lipids in about 3 to 4 months after starting therapy if his Nexletol is improved.  Pixie Casino, MD, [facility], [place] Director of the [facility]  [facility] Diplomate of the American Board of Clinical Lipidology Attending Cardiologist  Direct Dial: [phone]  Fax: [phone]  Website:  [email or web address]  Nadean Corwin Zabdi Mis 07/27/2022, 8:26 AM

## 2022-07-28 ENCOUNTER — Telehealth: Payer: Self-pay | Admitting: Internal Medicine

## 2022-07-28 NOTE — Telephone Encounter (Signed)
PA for nexletol submitted via CMM (Key: [id number])

## 2022-08-02 MED ORDER — NEXLETOL 180 MG PO TABS: 1.0000 | ORAL_TABLET | Freq: Every day | ORAL | 11 refills | Status: DC

## 2022-08-02 NOTE — Addendum Note (Signed)
Addended by: Fidel Levy on: 08/02/2022 09:39 AM   Modules accepted: Orders

## 2022-08-02 NOTE — Telephone Encounter (Signed)
Medication approved:   26-JAN-24 -- :26-JUL-24  Nexletol '180MG'$  OR TABS Quantity:30;

## 2022-08-24 DIAGNOSIS — Z1283 Encounter for screening for malignant neoplasm of skin: Secondary | ICD-10-CM | POA: Diagnosis not present

## 2022-08-24 DIAGNOSIS — Z85820 Personal history of malignant melanoma of skin: Secondary | ICD-10-CM | POA: Diagnosis not present

## 2022-08-24 DIAGNOSIS — Z08 Encounter for follow-up examination after completed treatment for malignant neoplasm: Secondary | ICD-10-CM | POA: Diagnosis not present

## 2022-08-24 DIAGNOSIS — L570 Actinic keratosis: Secondary | ICD-10-CM | POA: Diagnosis not present

## 2022-08-24 DIAGNOSIS — L820 Inflamed seborrheic keratosis: Secondary | ICD-10-CM | POA: Diagnosis not present

## 2022-08-24 DIAGNOSIS — D225 Melanocytic nevi of trunk: Secondary | ICD-10-CM | POA: Diagnosis not present

## 2022-08-24 DIAGNOSIS — X32XXXD Exposure to sunlight, subsequent encounter: Secondary | ICD-10-CM | POA: Diagnosis not present

## 2022-08-28 DIAGNOSIS — R972 Elevated prostate specific antigen [PSA]: Secondary | ICD-10-CM | POA: Diagnosis not present

## 2022-09-06 ENCOUNTER — Other Ambulatory Visit: Payer: Self-pay | Admitting: Cardiovascular Disease

## 2022-09-06 ENCOUNTER — Other Ambulatory Visit: Payer: Self-pay | Admitting: *Deleted

## 2022-09-06 DIAGNOSIS — I10 Essential (primary) hypertension: Secondary | ICD-10-CM

## 2022-09-06 MED ORDER — LISINOPRIL 5 MG PO TABS: 5.0000 mg | ORAL_TABLET | Freq: Every day | ORAL | 0 refills | Status: DC

## 2022-09-06 NOTE — Telephone Encounter (Signed)
Pt last saw Dr Debara Pickett 07/27/22, last labs 07/17/22 Creat 1.25, age 77, weight 96.6kg, based on specified criteria pt is on appropriate dosage of Eliquis '5mg'$  BID for afib.  Will refill rx.

## 2022-09-06 NOTE — Telephone Encounter (Signed)
Je pt NTBS by new provider in July for 6 mos RF SENT to pharmacy

## 2022-09-06 NOTE — Telephone Encounter (Signed)
Apt scheduled for 01/02/2023 GM

## 2022-09-07 MED ORDER — LISINOPRIL 5 MG PO TABS: 5.0000 mg | ORAL_TABLET | Freq: Every day | ORAL | 0 refills | Status: DC

## 2022-09-07 NOTE — Addendum Note (Signed)
Addended by: Antonietta Barcelona D on: 09/07/2022 07:45 AM   Modules accepted: Orders

## 2022-10-03 ENCOUNTER — Other Ambulatory Visit: Payer: Self-pay

## 2022-10-11 DIAGNOSIS — R972 Elevated prostate specific antigen [PSA]: Secondary | ICD-10-CM | POA: Diagnosis not present

## 2022-10-18 DIAGNOSIS — R3121 Asymptomatic microscopic hematuria: Secondary | ICD-10-CM | POA: Diagnosis not present

## 2022-10-18 DIAGNOSIS — N411 Chronic prostatitis: Secondary | ICD-10-CM | POA: Diagnosis not present

## 2022-10-18 DIAGNOSIS — R972 Elevated prostate specific antigen [PSA]: Secondary | ICD-10-CM | POA: Diagnosis not present

## 2022-11-14 DIAGNOSIS — R3121 Asymptomatic microscopic hematuria: Secondary | ICD-10-CM | POA: Diagnosis not present

## 2022-11-14 DIAGNOSIS — N200 Calculus of kidney: Secondary | ICD-10-CM | POA: Diagnosis not present

## 2022-11-14 DIAGNOSIS — N3289 Other specified disorders of bladder: Secondary | ICD-10-CM | POA: Diagnosis not present

## 2022-11-14 DIAGNOSIS — K8020 Calculus of gallbladder without cholecystitis without obstruction: Secondary | ICD-10-CM | POA: Diagnosis not present

## 2022-12-13 DIAGNOSIS — N281 Cyst of kidney, acquired: Secondary | ICD-10-CM | POA: Diagnosis not present

## 2022-12-13 DIAGNOSIS — R3121 Asymptomatic microscopic hematuria: Secondary | ICD-10-CM | POA: Diagnosis not present

## 2022-12-18 ENCOUNTER — Ambulatory Visit (INDEPENDENT_AMBULATORY_CARE_PROVIDER_SITE_OTHER): Payer: PPO

## 2022-12-18 ENCOUNTER — Other Ambulatory Visit: Payer: PPO

## 2022-12-18 VITALS — Ht 77.0 in | Wt 210.0 lb

## 2022-12-18 DIAGNOSIS — Z0000 Encounter for general adult medical examination without abnormal findings: Secondary | ICD-10-CM

## 2022-12-18 DIAGNOSIS — M791 Myalgia, unspecified site: Secondary | ICD-10-CM | POA: Diagnosis not present

## 2022-12-18 DIAGNOSIS — I10 Essential (primary) hypertension: Secondary | ICD-10-CM | POA: Diagnosis not present

## 2022-12-18 DIAGNOSIS — I4819 Other persistent atrial fibrillation: Secondary | ICD-10-CM | POA: Diagnosis not present

## 2022-12-18 DIAGNOSIS — T466X5A Adverse effect of antihyperlipidemic and antiarteriosclerotic drugs, initial encounter: Secondary | ICD-10-CM | POA: Diagnosis not present

## 2022-12-18 DIAGNOSIS — I25118 Atherosclerotic heart disease of native coronary artery with other forms of angina pectoris: Secondary | ICD-10-CM | POA: Diagnosis not present

## 2022-12-18 DIAGNOSIS — I2510 Atherosclerotic heart disease of native coronary artery without angina pectoris: Secondary | ICD-10-CM | POA: Diagnosis not present

## 2022-12-18 DIAGNOSIS — Z0100 Encounter for examination of eyes and vision without abnormal findings: Secondary | ICD-10-CM

## 2022-12-18 NOTE — Progress Notes (Signed)
Subjective:   William Jefferson is a 77 y.o. male who presents for Medicare Annual/Subsequent preventive examination. I connected with  William Jefferson on 12/18/22 by a audio enabled telemedicine application and verified that I am speaking with the correct person using two identifiers.  Patient Location: Home  Provider Location: Home Office  I discussed the limitations of evaluation and management by telemedicine. The patient expressed understanding and agreed to proceed.  Review of Systems     Cardiac Risk Factors include: advanced age (>44men, >58 women);hypertension;male gender;dyslipidemia     Objective:    Today's Vitals   12/18/22 1120  Weight: 210 lb (95.3 kg)  Height: 6\' 5"  (1.956 m)   Body mass index is 24.9 kg/m.     12/18/2022   11:27 AM 12/14/2021   11:27 AM 12/17/2020    9:03 AM 12/13/2020   11:35 AM  Advanced Directives  Does Patient Have a Medical Advance Directive? No No No No  Would patient like information on creating a medical advance directive? No - Patient declined No - Patient declined No - Patient declined No - Patient declined    Current Medications (verified) Outpatient Encounter Medications as of 12/18/2022  Medication Sig   Bempedoic Acid (NEXLETOL) 180 MG TABS Take 1 tablet (180 mg total) by mouth daily.   cephALEXin (KEFLEX) 500 MG capsule Take 500 mg by mouth daily.   ELIQUIS 5 MG TABS tablet TAKE (1) TABLET TWICE A DAY.   lisinopril (ZESTRIL) 5 MG tablet Take 1 tablet (5 mg total) by mouth daily.   Lutein 20 MG CAPS Take 20 mg by mouth daily.   metoprolol tartrate (LOPRESSOR) 25 MG tablet TAKE ONE TABLET TWICE DAILY   nitroGLYCERIN (NITROSTAT) 0.4 MG SL tablet PLACE 1 TABLET UNDER THE TONGUE EVERY 5 MINUTES AS NEEDED FOR CHEST PAIN   Saw Palmetto 450 MG CAPS Take 900 mg by mouth daily.   SF 5000 PLUS 1.1 % CREA dental cream Take by mouth.   vitamin C (ASCORBIC ACID) 500 MG tablet Take 500 mg by mouth daily.   No facility-administered  encounter medications on file as of 12/18/2022.    Allergies (verified) Crestor [rosuvastatin], Lipitor [atorvastatin], Penicillins, Sulfa antibiotics, Zetia [ezetimibe], and Zocor [simvastatin]   History: Past Medical History:  Diagnosis Date   Bladder stone    removed in 2014 by Dr. Annabell Howells   Cataract    Coronary artery disease    Hyperlipidemia    statin intolerance   Hypertension    Past Surgical History:  Procedure Laterality Date   CARDIAC CATHETERIZATION  06/10/1998   large P150 iliac stent to RCA - 3.5x36mm NIR stent (Dr. Jonette Eva)   CARDIAC CATHETERIZATION  07/16/2007   mid-distal LAD stenosis, normal LV function, 2.5x38mm Promus stent to obtuse marginal, 2.5x56mm Promus stent to osital & proximal diagonal (Dr. Jonette Eva, Dr. Aram Candela)   CARDIOVERSION N/A 12/17/2020   Procedure: CARDIOVERSION;  Surgeon: Chrystie Nose, MD;  Location: [facility];  Service: Cardiovascular;  Laterality: N/A;   MELANOMA EXCISION  2018   NM MYOCAR PERF WALL MOTION  08/2008   bruce myoview; normal pattern of perfusion, EF 77%   PROSTATE BIOPSY  2002   TRANSTHORACIC ECHOCARDIOGRAM  08/2012   mild conc LVH   Family History  Problem Relation Age of Onset   Cancer Mother        breast   Emphysema Father    Cirrhosis Brother    Alcohol abuse Brother  Social History   Socioeconomic History   Marital status: Married    Spouse name: William Jefferson   Number of children: 3   Years of education: 16   Highest education level: Bachelor's degree (e.g., BA, AB, BS)  Occupational History    Employer: [employer]  Tobacco Use   Smoking status: Former    Packs/day: 3.00    Years: 30.00    Additional pack years: 0.00    Total pack years: 90.00    Types: Cigarettes    Quit date: 08/03/1997    Years since quitting: 25.3   Smokeless tobacco: Never  Vaping Use   Vaping Use: Never used  Substance and Sexual Activity   Alcohol use: Not Currently   Drug use: Not Currently   Sexual activity:  Not Currently  Other Topics Concern   Not on file  Social History Narrative   Lives home with wife, family nearby   Social Determinants of Health   Financial Resource Strain: Low Risk  (12/18/2022)   Overall Financial Resource Strain (CARDIA)    Difficulty of Paying Living Expenses: Not hard at all  Food Insecurity: No Food Insecurity (12/18/2022)   Hunger Vital Sign    Worried About Running Out of Food in the Last Year: Never true    Ran Out of Food in the Last Year: Never true  Transportation Needs: No Transportation Needs (12/18/2022)   PRAPARE - Administrator, Civil Service (Medical): No    Lack of Transportation (Non-Medical): No  Physical Activity: Insufficiently Active (12/18/2022)   Exercise Vital Sign    Days of Exercise per Week: 3 days    Minutes of Exercise per Session: 30 min  Stress: No Stress Concern Present (12/18/2022)   [employer] of Occupational Health - Occupational Stress Questionnaire    Feeling of Stress : Not at all  Social Connections: Moderately Integrated (12/18/2022)   Social Connection and Isolation Panel [NHANES]    Frequency of Communication with Friends and Family: More than three times a week    Frequency of Social Gatherings with Friends and Family: More than three times a week    Attends Religious Services: More than 4 times per year    Active Member of [employer] or Organizations: No    Attends Engineer, structural: Never    Marital Status: Married    Tobacco Counseling Counseling given: Not Answered   Clinical Intake:  Pre-visit preparation completed: Yes  Pain : No/denies pain     Nutritional Risks: None Diabetes: No  How often do you need to have someone help you when you read instructions, pamphlets, or other written materials from your doctor or pharmacy?: 1 - Never  Diabetic?no   Interpreter Needed?: No  Information entered by :: Renie Ora, LPN   Activities of Daily Living    12/18/2022   11:27  AM  In your present state of health, do you have any difficulty performing the following activities:  Hearing? 0  Vision? 0  Difficulty concentrating or making decisions? 0  Walking or climbing stairs? 0  Dressing or bathing? 0  Doing errands, shopping? 0  Preparing Food and eating ? N  Using the Toilet? N  In the past six months, have you accidently leaked urine? N  Do you have problems with loss of bowel control? N  Managing your Medications? N  Managing your Finances? N  Housekeeping or managing your Housekeeping? N    Patient Care Team: Daryll Drown,  NP (Inactive) as PCP - General (Nurse Practitioner) Chrystie Nose, MD as PCP - Cardiology (Cardiology) Bjorn Pippin, MD as Attending Physician (Urology) Nita Sells, MD (Dermatology)  Indicate any recent Medical Services you may have received from other than [facility] providers in the past year (date may be approximate).     Assessment:   This is a routine wellness examination for Hayato.  Hearing/Vision screen Vision Screening - Comments:: Referral 12/18/2022  Dietary issues and exercise activities discussed: Current Exercise Habits: Home exercise routine, Type of exercise: walking, Time (Minutes): 30, Frequency (Times/Week): 3, Weekly Exercise (Minutes/Week): 90, Intensity: Mild, Exercise limited by: None identified   Goals Addressed             This Visit's Progress    Exercise 3x per week (30 min per time)   On track      Depression Screen    12/18/2022   11:24 AM 07/17/2022    8:39 AM 07/05/2022    1:53 PM 01/13/2022    8:58 AM 12/14/2021   11:21 AM 10/12/2021    4:07 PM 12/21/2020    8:19 AM  PHQ 2/9 Scores  PHQ - 2 Score 0 0 0 0 0 0 0  PHQ- 9 Score  0 0   0     Fall Risk    12/18/2022   11:22 AM 07/17/2022    8:44 AM 07/05/2022    1:53 PM 01/13/2022    8:57 AM 12/14/2021   11:19 AM  Fall Risk   Falls in the past year? 0 0 0 0 0  Number falls in past yr: 0 0 0  0  Injury with Fall? 0 0 0  0  Risk for  fall due to : No Fall Risks No Fall Risks No Fall Risks  Orthopedic patient  Follow up Falls prevention discussed Education provided Education provided;Falls evaluation completed  Falls prevention discussed    FALL RISK PREVENTION PERTAINING TO THE HOME:  Any stairs in or around the home? Yes  If so, are there any without handrails? No  Home free of loose throw rugs in walkways, pet beds, electrical cords, etc? Yes  Adequate lighting in your home to reduce risk of falls? Yes   ASSISTIVE DEVICES UTILIZED TO PREVENT FALLS:  Life alert? No  Use of a cane, walker or w/c? Yes  Grab bars in the bathroom? No  Shower chair or bench in shower? No  Elevated toilet seat or a handicapped toilet? No        12/18/2022   11:27 AM 12/14/2021   11:21 AM 12/13/2020   11:33 AM  6CIT Screen  What Year? 0 points 0 points 0 points  What month? 0 points 0 points 0 points  What time? 0 points 0 points 0 points  Count back from 20 0 points 0 points 0 points  Months in reverse 0 points 0 points 0 points  Repeat phrase 0 points 0 points 0 points  Total Score 0 points 0 points 0 points    Immunizations Immunization History  Administered Date(s) Administered   PFIZER(Purple Top)SARS-COV-2 Vaccination 07/29/2019, 08/19/2019, 04/16/2020   Td 06/02/1998   Tdap 09/17/2020   Zoster Recombinat (Shingrix) 01/13/2022   Zoster, Live 03/31/2016    TDAP status: Up to date  Flu Vaccine status: Declined, Education has been provided regarding the importance of this vaccine but patient still declined. Advised may receive this vaccine at local pharmacy or Health Dept. Aware to provide a copy of the  vaccination record if obtained from local pharmacy or Health Dept. Verbalized acceptance and understanding.  Pneumococcal vaccine status: Declined,  Education has been provided regarding the importance of this vaccine but patient still declined. Advised may receive this vaccine at local pharmacy or Health Dept. Aware to  provide a copy of the vaccination record if obtained from local pharmacy or Health Dept. Verbalized acceptance and understanding.   Covid-19 vaccine status: Completed vaccines  Qualifies for Shingles Vaccine? Yes   Zostavax completed Yes   Shingrix Completed?: Yes  Screening Tests Health Maintenance  Topic Date Due   Hepatitis C Screening  Never done   Pneumonia Vaccine 1+ Years old (1 of 1 - PCV) Never done   COVID-19 Vaccine (4 - 2023-24 season) 03/03/2022   Zoster Vaccines- Shingrix (2 of 2) 03/10/2022   INFLUENZA VACCINE  02/01/2023   Medicare Annual Wellness (AWV)  12/18/2023   DTaP/Tdap/Td (3 - Td or Tdap) 09/18/2030   HPV VACCINES  Aged Out    Health Maintenance  Health Maintenance Due  Topic Date Due   Hepatitis C Screening  Never done   Pneumonia Vaccine 30+ Years old (1 of 1 - PCV) Never done   COVID-19 Vaccine (4 - 2023-24 season) 03/03/2022   Zoster Vaccines- Shingrix (2 of 2) 03/10/2022    Colorectal cancer screening: No longer required.   Lung Cancer Screening: (Low Dose CT Chest recommended if Age 41-80 years, 30 pack-year currently smoking OR have quit w/in 15years.) does not qualify.   Lung Cancer Screening Referral: n/a  Additional Screening:  Hepatitis C Screening: does not qualify;  Vision Screening: Recommended annual ophthalmology exams for early detection of glaucoma and other disorders of the eye. Is the patient up to date with their annual eye exam?  No  Who is the provider or what is the name of the office in which the patient attends annual eye exams? Referral 12/18/2022 If pt is not established with a provider, would they like to be referred to a provider to establish care? No .   Dental Screening: Recommended annual dental exams for proper oral hygiene  Community Resource Referral / Chronic Care Management: CRR required this visit?  No   CCM required this visit?  No      Plan:     I have personally reviewed and noted the  following in the patient's chart:   Medical and social history Use of alcohol, tobacco or illicit drugs  Current medications and supplements including opioid prescriptions. Patient is not currently taking opioid prescriptions. Functional ability and status Nutritional status Physical activity Advanced directives List of other physicians Hospitalizations, surgeries, and ER visits in previous 12 months Vitals Screenings to include cognitive, depression, and falls Referrals and appointments  In addition, I have reviewed and discussed with patient certain preventive protocols, quality metrics, and best practice recommendations. A written personalized care plan for preventive services as well as general preventive health recommendations were provided to patient.     Lorrene Reid, LPN   02/29/5620   Nurse Notes: Declines Pneumonia vaccine

## 2022-12-18 NOTE — Patient Instructions (Signed)
Mr. William Jefferson , Thank you for taking time to come for your Medicare Wellness Visit. I appreciate your ongoing commitment to your health goals. Please review the following plan we discussed and let me know if I can assist you in the future.   These are the goals we discussed:  Goals      Exercise 3x per week (30 min per time)        This is a list of the screening recommended for you and due dates:  Health Maintenance  Topic Date Due   Hepatitis C Screening  Never done   Pneumonia Vaccine (1 of 1 - PCV) Never done   COVID-19 Vaccine (4 - 2023-24 season) 03/03/2022   Zoster (Shingles) Vaccine (2 of 2) 03/10/2022   Flu Shot  02/01/2023   Medicare Annual Wellness Visit  12/18/2023   DTaP/Tdap/Td vaccine (3 - Td or Tdap) 09/18/2030   HPV Vaccine  Aged Out    Advanced directives: Advance directive discussed with you today. I have provided a copy for you to complete at home and have notarized. Once this is complete please bring a copy in to our office so we can scan it into your chart.   Conditions/risks identified: Aim for 30 minutes of exercise or brisk walking, 6-8 glasses of water, and 5 servings of fruits and vegetables each day.   Next appointment: Follow up in one year for your annual wellness visit.   Preventive Care 31 Years and Older, Male  Preventive care refers to lifestyle choices and visits with your health care provider that can promote health and wellness. What does preventive care include? A yearly physical exam. This is also called an annual well check. Dental exams once or twice a year. Routine eye exams. Ask your health care provider how often you should have your eyes checked. Personal lifestyle choices, including: Daily care of your teeth and gums. Regular physical activity. Eating a healthy diet. Avoiding tobacco and drug use. Limiting alcohol use. Practicing safe sex. Taking low doses of aspirin every day. Taking vitamin and mineral supplements as  recommended by your health care provider. What happens during an annual well check? The services and screenings done by your health care provider during your annual well check will depend on your age, overall health, lifestyle risk factors, and family history of disease. Counseling  Your health care provider may ask you questions about your: Alcohol use. Tobacco use. Drug use. Emotional well-being. Home and relationship well-being. Sexual activity. Eating habits. History of falls. Memory and ability to understand (cognition). Work and work Astronomer. Screening  You may have the following tests or measurements: Height, weight, and BMI. Blood pressure. Lipid and cholesterol levels. These may be checked every 5 years, or more frequently if you are over 38 years old. Skin check. Lung cancer screening. You may have this screening every year starting at age 18 if you have a 30-pack-year history of smoking and currently smoke or have quit within the past 15 years. Fecal occult blood test (FOBT) of the stool. You may have this test every year starting at age 73. Flexible sigmoidoscopy or colonoscopy. You may have a sigmoidoscopy every 5 years or a colonoscopy every 10 years starting at age 45. Prostate cancer screening. Recommendations will vary depending on your family history and other risks. Hepatitis C blood test. Hepatitis B blood test. Sexually transmitted disease (STD) testing. Diabetes screening. This is done by checking your blood sugar (glucose) after you have not eaten for a  while (fasting). You may have this done every 1-3 years. Abdominal aortic aneurysm (AAA) screening. You may need this if you are a current or former smoker. Osteoporosis. You may be screened starting at age 53 if you are at high risk. Talk with your health care provider about your test results, treatment options, and if necessary, the need for more tests. Vaccines  Your health care provider may recommend  certain vaccines, such as: Influenza vaccine. This is recommended every year. Tetanus, diphtheria, and acellular pertussis (Tdap, Td) vaccine. You may need a Td booster every 10 years. Zoster vaccine. You may need this after age 22. Pneumococcal 13-valent conjugate (PCV13) vaccine. One dose is recommended after age 70. Pneumococcal polysaccharide (PPSV23) vaccine. One dose is recommended after age 70. Talk to your health care provider about which screenings and vaccines you need and how often you need them. This information is not intended to replace advice given to you by your health care provider. Make sure you discuss any questions you have with your health care provider. Document Released: 07/16/2015 Document Revised: 03/08/2016 Document Reviewed: 04/20/2015 Elsevier Interactive Patient Education  2017 [place] Prevention in the Home Falls can cause injuries. They can happen to people of all ages. There are many things you can do to make your home safe and to help prevent falls. What can I do on the outside of my home? Regularly fix the edges of walkways and driveways and fix any cracks. Remove anything that might make you trip as you walk through a door, such as a raised step or threshold. Trim any bushes or trees on the path to your home. Use bright outdoor lighting. Clear any walking paths of anything that might make someone trip, such as rocks or tools. Regularly check to see if handrails are loose or broken. Make sure that both sides of any steps have handrails. Any raised decks and porches should have guardrails on the edges. Have any leaves, snow, or ice cleared regularly. Use sand or salt on walking paths during winter. Clean up any spills in your garage right away. This includes oil or grease spills. What can I do in the bathroom? Use night lights. Install grab bars by the toilet and in the tub and shower. Do not use towel bars as grab bars. Use non-skid mats or  decals in the tub or shower. If you need to sit down in the shower, use a plastic, non-slip stool. Keep the floor dry. Clean up any water that spills on the floor as soon as it happens. Remove soap buildup in the tub or shower regularly. Attach bath mats securely with double-sided non-slip rug tape. Do not have throw rugs and other things on the floor that can make you trip. What can I do in the bedroom? Use night lights. Make sure that you have a light by your bed that is easy to reach. Do not use any sheets or blankets that are too big for your bed. They should not hang down onto the floor. Have a firm chair that has side arms. You can use this for support while you get dressed. Do not have throw rugs and other things on the floor that can make you trip. What can I do in the kitchen? Clean up any spills right away. Avoid walking on wet floors. Keep items that you use a lot in easy-to-reach places. If you need to reach something above you, use a strong step stool that has a grab  bar. Keep electrical cords out of the way. Do not use floor polish or wax that makes floors slippery. If you must use wax, use non-skid floor wax. Do not have throw rugs and other things on the floor that can make you trip. What can I do with my stairs? Do not leave any items on the stairs. Make sure that there are handrails on both sides of the stairs and use them. Fix handrails that are broken or loose. Make sure that handrails are as long as the stairways. Check any carpeting to make sure that it is firmly attached to the stairs. Fix any carpet that is loose or worn. Avoid having throw rugs at the top or bottom of the stairs. If you do have throw rugs, attach them to the floor with carpet tape. Make sure that you have a light switch at the top of the stairs and the bottom of the stairs. If you do not have them, ask someone to add them for you. What else can I do to help prevent falls? Wear shoes that: Do not  have high heels. Have rubber bottoms. Are comfortable and fit you well. Are closed at the toe. Do not wear sandals. If you use a stepladder: Make sure that it is fully opened. Do not climb a closed stepladder. Make sure that both sides of the stepladder are locked into place. Ask someone to hold it for you, if possible. Clearly mark and make sure that you can see: Any grab bars or handrails. First and last steps. Where the edge of each step is. Use tools that help you move around (mobility aids) if they are needed. These include: Canes. Walkers. Scooters. Crutches. Turn on the lights when you go into a dark area. Replace any light bulbs as soon as they burn out. Set up your furniture so you have a clear path. Avoid moving your furniture around. If any of your floors are uneven, fix them. If there are any pets around you, be aware of where they are. Review your medicines with your doctor. Some medicines can make you feel dizzy. This can increase your chance of falling. Ask your doctor what other things that you can do to help prevent falls. This information is not intended to replace advice given to you by your health care provider. Make sure you discuss any questions you have with your health care provider. Document Released: 04/15/2009 Document Revised: 11/25/2015 Document Reviewed: 07/24/2014 Elsevier Interactive Patient Education  2017 [employer].

## 2022-12-19 LAB — LIPID PANEL
Chol/HDL Ratio: 5.1 ratio — ABNORMAL HIGH (ref 0.0–5.0)
Cholesterol, Total: 143 mg/dL (ref 100–199)
HDL: 28 mg/dL — ABNORMAL LOW (ref >39)
LDL Chol Calc (NIH): 96 mg/dL (ref 0–99)
Triglycerides: 98 mg/dL (ref 0–149)
VLDL Cholesterol Cal: 19 mg/dL (ref 5–40)

## 2022-12-22 ENCOUNTER — Encounter: Payer: Self-pay | Admitting: Internal Medicine

## 2023-01-02 ENCOUNTER — Ambulatory Visit (INDEPENDENT_AMBULATORY_CARE_PROVIDER_SITE_OTHER): Payer: PPO | Admitting: Family Medicine

## 2023-01-02 ENCOUNTER — Encounter: Payer: Self-pay | Admitting: Family Medicine

## 2023-01-02 VITALS — BP 132/72 | HR 62 | Temp 98.2°F | Ht 77.0 in | Wt 211.0 lb

## 2023-01-02 DIAGNOSIS — R7309 Other abnormal glucose: Secondary | ICD-10-CM | POA: Diagnosis not present

## 2023-01-02 DIAGNOSIS — Z23 Encounter for immunization: Secondary | ICD-10-CM

## 2023-01-02 DIAGNOSIS — R7303 Prediabetes: Secondary | ICD-10-CM | POA: Insufficient documentation

## 2023-01-02 DIAGNOSIS — H6123 Impacted cerumen, bilateral: Secondary | ICD-10-CM

## 2023-01-02 DIAGNOSIS — E782 Mixed hyperlipidemia: Secondary | ICD-10-CM

## 2023-01-02 DIAGNOSIS — I10 Essential (primary) hypertension: Secondary | ICD-10-CM

## 2023-01-02 LAB — BAYER DCA HB A1C WAIVED: HB A1C (BAYER DCA - WAIVED): 5.8 % — ABNORMAL HIGH (ref 4.8–5.6)

## 2023-01-02 MED ORDER — LISINOPRIL 5 MG PO TABS: 5.0000 mg | ORAL_TABLET | Freq: Every day | ORAL | 0 refills | Status: DC

## 2023-01-02 MED ORDER — METOPROLOL TARTRATE 25 MG PO TABS
25.0000 mg | ORAL_TABLET | Freq: Two times a day (BID) | ORAL | 2 refills | Status: DC
Start: 2023-01-02 — End: 2024-02-01

## 2023-01-02 NOTE — Patient Instructions (Signed)
Monitoring your BP at home   Your BP was elevated today in office  Please keep a log of your BP at home.  The best time to take BP is 1st thing in the morning after waking.  Sit for 5 minutes with feet flat on the floor, arm at heart level.  Options for BP cuffs are at [employer], [employer], [employer], [facility] & [facility].  We will review measurements at follow up and determine plan for BP management.  If you have access, you can send a message in MyChart with your measurements prior to your follow up appointment.  The brand I recommend to get is Omron (Bronze).    

## 2023-01-02 NOTE — Progress Notes (Signed)
New Patient Office Visit  Subjective   Patient ID: William Jefferson, male    DOB: 02-10-1946  Age: 77 y.o. MRN: [id number]  CC:  Chief Complaint  Patient presents with   Medical Management of Chronic Issues   HPI William Jefferson presents to establish care Follows with Cardiology and Urology  DCCV in June 22, no complaints since.  States that he has a high PSA and prostatitis, which is why he is on keflex.  Due for follow up in December.   Concerns today include: 1. No complaints today.   Occupation: Retired from post office  Marital status: married  Diet: states he eats sugar, sausage, bacon, fried potatoes, apple juice  Fruit in the morning. Snacks for lunch.  Supper: vegetables, meat, hamburger, potatoes, bread.  Exercise: walking 30 minutes daily  Substance use: none  Last eye exam: due  Last dental exam: Dr. Loleta Chance.  Last colonoscopy: not interested.  PSA: elevated follows with urology  Refills needed today: lisinopril, metoprolol   Other specialists seen: urology and cardiology  Dermatology exam: UTD - history of melanoma.  Fasting today:  yes  Immunizations needed: Flu Vaccine: no  Tdap Vaccine: no  - every 5yrs - (<3 lifetime doses or unknown): all wounds -- look up need for Tetanus IG - (>=3 lifetime doses): clean/minor wound if >60yrs from previous; all other wounds if >62yrs from previous Zoster Vaccine: yes, second  (those >50yo, once) Pneumonia Vaccine: no - declined  (those w/ risk factors) - (<41yr) Both: Immunocompromised, cochlear implant, CSF leak, asplenic, sickle cell, Chronic Renal Failure - (<81yr) PPSV-23 only: Heart dz, lung disease, DM, tobacco abuse, alcoholism, cirrhosis/liver disease. - (>75yr): PPSV13 then PPSV23 in 6-12mths;  - (>89yr): repeat PPSV23 once if pt received prior to 77yo and 70yrs have passed  Outpatient Encounter Medications as of 01/02/2023  Medication Sig   Bempedoic Acid (NEXLETOL) 180 MG TABS Take 1 tablet (180 mg  total) by mouth daily.   cephALEXin (KEFLEX) 500 MG capsule Take 500 mg by mouth daily.   ELIQUIS 5 MG TABS tablet TAKE (1) TABLET TWICE A DAY.   lisinopril (ZESTRIL) 5 MG tablet Take 1 tablet (5 mg total) by mouth daily.   Lutein 20 MG CAPS Take 20 mg by mouth daily.   metoprolol tartrate (LOPRESSOR) 25 MG tablet TAKE ONE TABLET TWICE DAILY   nitroGLYCERIN (NITROSTAT) 0.4 MG SL tablet PLACE 1 TABLET UNDER THE TONGUE EVERY 5 MINUTES AS NEEDED FOR CHEST PAIN   Saw Palmetto 450 MG CAPS Take 900 mg by mouth daily.   SF 5000 PLUS 1.1 % CREA dental cream Take by mouth.   vitamin C (ASCORBIC ACID) 500 MG tablet Take 500 mg by mouth daily.   No facility-administered encounter medications on file as of 01/02/2023.   Past Medical History:  Diagnosis Date   Bladder stone    removed in 2014 by Dr. Annabell Howells   Cataract    Coronary artery disease    Hyperlipidemia    statin intolerance   Hypertension    Past Surgical History:  Procedure Laterality Date   CARDIAC CATHETERIZATION  06/10/1998   large P150 iliac stent to RCA - 3.5x34mm NIR stent (Dr. Jonette Eva)   CARDIAC CATHETERIZATION  07/16/2007   mid-distal LAD stenosis, normal LV function, 2.5x80mm Promus stent to obtuse marginal, 2.5x1mm Promus stent to osital & proximal diagonal (Dr. Jonette Eva, Dr. Aram Candela)   CARDIOVERSION N/A 12/17/2020   Procedure: CARDIOVERSION;  Surgeon: Chrystie Nose,  MD;  Location: [facility];  Service: Cardiovascular;  Laterality: N/A;   MELANOMA EXCISION  2018   NM MYOCAR PERF WALL MOTION  08/2008   bruce myoview; normal pattern of perfusion, EF 77%   PROSTATE BIOPSY  2002   TRANSTHORACIC ECHOCARDIOGRAM  08/2012   mild conc LVH   Family History  Problem Relation Age of Onset   Cancer Mother        breast   Emphysema Father    Cirrhosis Brother    Alcohol abuse Brother    Social History   Socioeconomic History   Marital status: Married    Spouse name: Claris Che   Number of children: 3   Years of  education: 16   Highest education level: Bachelor's degree (e.g., BA, AB, BS)  Occupational History    Employer: [employer]  Tobacco Use   Smoking status: Former    Packs/day: 3.00    Years: 30.00    Additional pack years: 0.00    Total pack years: 90.00    Types: Cigarettes    Quit date: 08/03/1997    Years since quitting: 25.4   Smokeless tobacco: Never  Vaping Use   Vaping Use: Never used  Substance and Sexual Activity   Alcohol use: Not Currently   Drug use: Not Currently   Sexual activity: Not Currently  Other Topics Concern   Not on file  Social History Narrative   Lives home with wife, family nearby   Social Determinants of Health   Financial Resource Strain: Low Risk  (12/18/2022)   Overall Financial Resource Strain (CARDIA)    Difficulty of Paying Living Expenses: Not hard at all  Food Insecurity: No Food Insecurity (12/18/2022)   Hunger Vital Sign    Worried About Running Out of Food in the Last Year: Never true    Ran Out of Food in the Last Year: Never true  Transportation Needs: No Transportation Needs (12/18/2022)   PRAPARE - Administrator, Civil Service (Medical): No    Lack of Transportation (Non-Medical): No  Physical Activity: Insufficiently Active (12/18/2022)   Exercise Vital Sign    Days of Exercise per Week: 3 days    Minutes of Exercise per Session: 30 min  Stress: No Stress Concern Present (12/18/2022)   [employer] of Occupational Health - Occupational Stress Questionnaire    Feeling of Stress : Not at all  Social Connections: Moderately Integrated (12/18/2022)   Social Connection and Isolation Panel [NHANES]    Frequency of Communication with Friends and Family: More than three times a week    Frequency of Social Gatherings with Friends and Family: More than three times a week    Attends Religious Services: More than 4 times per year    Active Member of [employer] or Organizations: No    Attends Banker Meetings: Never     Marital Status: Married  Catering manager Violence: Not At Risk (12/18/2022)   Humiliation, Afraid, Rape, and Kick questionnaire    Fear of Current or Ex-Partner: No    Emotionally Abused: No    Physically Abused: No    Sexually Abused: No    ROS As per HPI  Objective   BP 132/72   Temp 98.2 F (36.8 C)   Ht 6\' 5"  (1.956 m)   Wt 211 lb (95.7 kg)   BMI 25.02 kg/m   Physical Exam Constitutional:      General: He is awake. He is not in acute distress.  Appearance: Normal appearance. He is well-developed and well-groomed. He is not ill-appearing, toxic-appearing or diaphoretic.  HENT:     Right Ear: There is impacted cerumen.     Left Ear: There is impacted cerumen.  Eyes:     Pupils: Pupils are equal, round, and reactive to light. Pupils are equal.  Cardiovascular:     Rate and Rhythm: Normal rate.     Pulses: Normal pulses.          Radial pulses are 2+ on the right side and 2+ on the left side.       Posterior tibial pulses are 2+ on the right side and 2+ on the left side.     Heart sounds: Heart sounds are distant. No murmur heard.    No gallop.  Pulmonary:     Effort: Pulmonary effort is normal. No respiratory distress.     Breath sounds: Normal breath sounds. No stridor. No wheezing, rhonchi or rales.  Abdominal:     General: Abdomen is flat. Bowel sounds are normal.     Palpations: Abdomen is soft.     Tenderness: There is no abdominal tenderness.     Hernia: No hernia is present.  Musculoskeletal:     Cervical back: Full passive range of motion without pain and neck supple.     Right lower leg: No edema.     Left lower leg: No edema.  Lymphadenopathy:     Head:     Right side of head: No submental, submandibular, tonsillar, preauricular or posterior auricular adenopathy.     Left side of head: No submental, submandibular, tonsillar, preauricular or posterior auricular adenopathy.  Skin:    General: Skin is warm.     Capillary Refill: Capillary refill  takes less than 2 seconds.  Neurological:     General: No focal deficit present.     Mental Status: He is alert, oriented to person, place, and time and easily aroused. Mental status is at baseline.     GCS: GCS eye subscore is 4. GCS verbal subscore is 5. GCS motor subscore is 6.     Motor: No weakness.  Psychiatric:        Attention and Perception: Attention and perception normal.        Mood and Affect: Mood and affect normal.        Speech: Speech normal.        Behavior: Behavior normal. Behavior is cooperative.        Thought Content: Thought content normal. Thought content does not include homicidal or suicidal ideation. Thought content does not include homicidal or suicidal plan.        Cognition and Memory: Cognition and memory normal.        Judgment: Judgment normal.       01/02/2023    8:03 AM 12/18/2022   11:24 AM 07/17/2022    8:39 AM  Depression screen PHQ 2/9  Decreased Interest 0 0 0  Down, Depressed, Hopeless 0 0 0  PHQ - 2 Score 0 0 0  Altered sleeping 0  0  Tired, decreased energy 0  0  Change in appetite 0  0  Feeling bad or failure about yourself  0  0  Trouble concentrating 0  0  Moving slowly or fidgety/restless 0  0  Suicidal thoughts 0  0  PHQ-9 Score 0  0  Difficult doing work/chores Not difficult at all  Not difficult at all      01/02/2023  8:03 AM 07/17/2022    8:40 AM 07/05/2022    1:54 PM 10/12/2021    4:08 PM  GAD 7 : Generalized Anxiety Score  Nervous, Anxious, on Edge 0 0 0 0  Control/stop worrying 0 0 0 0  Worry too much - different things 0 0 0 0  Trouble relaxing 0 0 0 0  Restless 0 0 0 0  Easily annoyed or irritable 0 0 0 0  Afraid - awful might happen 0 0 0 0  Total GAD 7 Score 0 0 0 0  Anxiety Difficulty Not difficult at all Not difficult at all Not difficult at all Not difficult at all   Assessment & Plan:  1. Primary hypertension Slightly above goal. Managed by Cardiology. Instructed patient to monitor BP at home and bring  measurements to follow up with PCP and Cardiology.  - lisinopril (ZESTRIL) 5 MG tablet; Take 1 tablet (5 mg total) by mouth daily.  Dispense: 90 tablet; Refill: 0 - metoprolol tartrate (LOPRESSOR) 25 MG tablet; Take 1 tablet (25 mg total) by mouth 2 (two) times daily for 90 doses.  Dispense: 60 tablet; Refill: 2  2. Mixed hyperlipidemia Managed by Cardiology. Fasting.  - Lipid panel  3. Prediabetes A1C well controlled with A1C of 5.8%.  Labs as below. Will communicate results to patient once available. Continue diet and lifestyle changes.  - CBC with Differential/Platelet - CMP14+EGFR - Bayer DCA Hb A1c Waived - Vitamin B12 - Vitamin D, 25-hydroxy - TSH  4. Impacted cerumen, bilateral Bilateral canals cleaned out.   The above assessment and management plan was discussed with the patient. The patient verbalized understanding of and has agreed to the management plan using shared-decision making. Patient is aware to call the clinic if they develop any new symptoms or if symptoms fail to improve or worsen. Patient is aware when to return to the clinic for a follow-up visit. Patient educated on when it is appropriate to go to the emergency department.   Return in about 6 months (around 07/05/2023) for Chronic Condition Follow up.   Neale Burly, DNP-FNP [facility] [address] [place], [place] [address] [phone]

## 2023-01-03 LAB — CBC WITH DIFFERENTIAL/PLATELET
Basophils Absolute: 0.1 10*3/uL (ref 0.0–0.2)
Basos: 1 %
EOS (ABSOLUTE): 0.3 10*3/uL (ref 0.0–0.4)
Eos: 3 %
Hematocrit: 47.9 % (ref 37.5–51.0)
Hemoglobin: 15.9 g/dL (ref 13.0–17.7)
Immature Grans (Abs): 0.1 10*3/uL (ref 0.0–0.1)
Immature Granulocytes: 2 %
Lymphocytes Absolute: 1.8 10*3/uL (ref 0.7–3.1)
Lymphs: 23 %
MCH: 30.8 pg (ref 26.6–33.0)
MCHC: 33.2 g/dL (ref 31.5–35.7)
MCV: 93 fL (ref 79–97)
Monocytes Absolute: 0.7 10*3/uL (ref 0.1–0.9)
Monocytes: 9 %
Neutrophils Absolute: 4.7 10*3/uL (ref 1.4–7.0)
Neutrophils: 62 %
Platelets: 245 10*3/uL (ref 150–450)
RBC: 5.16 x10E6/uL (ref 4.14–5.80)
RDW: 13.1 % (ref 11.6–15.4)
WBC: 7.6 10*3/uL (ref 3.4–10.8)

## 2023-01-03 LAB — CMP14+EGFR
ALT: 21 IU/L (ref 0–44)
AST: 18 IU/L (ref 0–40)
Albumin: 4.2 g/dL (ref 3.8–4.8)
Alkaline Phosphatase: 65 IU/L (ref 44–121)
BUN/Creatinine Ratio: 11 (ref 10–24)
BUN: 14 mg/dL (ref 8–27)
Bilirubin Total: 0.7 mg/dL (ref 0.0–1.2)
CO2: 24 mmol/L (ref 20–29)
Calcium: 9.5 mg/dL (ref 8.6–10.2)
Chloride: 105 mmol/L (ref 96–106)
Creatinine, Ser: 1.23 mg/dL (ref 0.76–1.27)
Globulin, Total: 2.4 g/dL (ref 1.5–4.5)
Glucose: 109 mg/dL — ABNORMAL HIGH (ref 70–99)
Potassium: 4.2 mmol/L (ref 3.5–5.2)
Sodium: 142 mmol/L (ref 134–144)
Total Protein: 6.6 g/dL (ref 6.0–8.5)
eGFR: 61 mL/min/{1.73_m2} (ref >59)

## 2023-01-03 LAB — VITAMIN D 25 HYDROXY (VIT D DEFICIENCY, FRACTURES): Vit D, 25-Hydroxy: 23 ng/mL — ABNORMAL LOW (ref 30.0–100.0)

## 2023-01-03 LAB — LIPID PANEL
Chol/HDL Ratio: 6 ratio — ABNORMAL HIGH (ref 0.0–5.0)
Cholesterol, Total: 180 mg/dL (ref 100–199)
HDL: 30 mg/dL — ABNORMAL LOW (ref >39)
LDL Chol Calc (NIH): 124 mg/dL — ABNORMAL HIGH (ref 0–99)
Triglycerides: 145 mg/dL (ref 0–149)
VLDL Cholesterol Cal: 26 mg/dL (ref 5–40)

## 2023-01-03 LAB — TSH: TSH: 1.31 u[IU]/mL (ref 0.450–4.500)

## 2023-01-03 LAB — VITAMIN B12: Vitamin B-12: 237 pg/mL (ref 232–1245)

## 2023-01-03 NOTE — Progress Notes (Signed)
Slightly elevated BG, consistent with A1C. Patient remains in prediabetes range. Discussed in visit. Vitamin D level is low. Recommend a daily OTC vitamin D supplement with 1000-2000 IU. Cholesterol is elevated. Diet encouraged - increase intake of fresh fruits and vegetables, increase intake of lean proteins. Bake, broil, or grill foods. Avoid fried, greasy, and fatty foods. Avoid fast foods. Increase intake of fiber-rich whole grains. Exercise encouraged - at least 150 minutes per week and advance as tolerated. Given elevated ASCVD risk, would recommend patient discuss PCSK9 inhibitor with Cardiology. Can try red yeast rice if patient has not, but he is intolerant to statins and zetia.   The 10-year ASCVD risk score (Arnett DK, et al., 2019) is: 35.1%   Values used to calculate the score:     Age: 77 years     Sex: Male     Is Non-Hispanic African American: No     Diabetic: No     Tobacco smoker: No     Systolic Blood Pressure: 132 mmHg     Is BP treated: Yes     HDL Cholesterol: 30 mg/dL     Total Cholesterol: 180 mg/dL

## 2023-01-12 ENCOUNTER — Encounter: Payer: Self-pay | Admitting: *Deleted

## 2023-02-22 DIAGNOSIS — Z85820 Personal history of malignant melanoma of skin: Secondary | ICD-10-CM | POA: Diagnosis not present

## 2023-02-22 DIAGNOSIS — X32XXXD Exposure to sunlight, subsequent encounter: Secondary | ICD-10-CM | POA: Diagnosis not present

## 2023-02-22 DIAGNOSIS — Z08 Encounter for follow-up examination after completed treatment for malignant neoplasm: Secondary | ICD-10-CM | POA: Diagnosis not present

## 2023-02-22 DIAGNOSIS — L570 Actinic keratosis: Secondary | ICD-10-CM | POA: Diagnosis not present

## 2023-02-22 DIAGNOSIS — Z1283 Encounter for screening for malignant neoplasm of skin: Secondary | ICD-10-CM | POA: Diagnosis not present

## 2023-02-22 DIAGNOSIS — L820 Inflamed seborrheic keratosis: Secondary | ICD-10-CM | POA: Diagnosis not present

## 2023-03-12 ENCOUNTER — Telehealth: Payer: Self-pay

## 2023-03-12 ENCOUNTER — Other Ambulatory Visit (HOSPITAL_COMMUNITY): Payer: Self-pay

## 2023-03-12 NOTE — Telephone Encounter (Addendum)
Pharmacy Patient Advocate Encounter   Received notification from CoverMyMeds that prior authorization for NEXLETOL is required/requested.   Insurance verification completed.   The patient is insured through [employer]/ [employer] .   Per test claim: PA required; PA submitted to [employer]/ [employer] via FAX Key/confirmation #[id number] FAXED Status is pending

## 2023-03-13 ENCOUNTER — Other Ambulatory Visit (HOSPITAL_COMMUNITY): Payer: Self-pay

## 2023-03-13 NOTE — Telephone Encounter (Signed)
Pharmacy Patient Advocate Encounter  Received notification from [employer]/ [employer] that Prior Authorization for nexletol has been APPROVED from 03/12/23 to 03/11/24. Ran test claim, Copay is $100.00. This test claim was processed through [facility]- copay amounts may vary at other pharmacies due to pharmacy/plan contracts, or as the patient moves through the different stages of their insurance plan.   PA #/Case ID/Reference #: [id number]

## 2023-05-09 ENCOUNTER — Other Ambulatory Visit: Payer: Self-pay | Admitting: Family Medicine

## 2023-05-09 DIAGNOSIS — I10 Essential (primary) hypertension: Secondary | ICD-10-CM

## 2023-07-05 ENCOUNTER — Ambulatory Visit: Payer: PPO | Admitting: Family Medicine

## 2023-07-06 ENCOUNTER — Telehealth: Payer: Self-pay | Admitting: Family Medicine

## 2023-07-06 ENCOUNTER — Ambulatory Visit (INDEPENDENT_AMBULATORY_CARE_PROVIDER_SITE_OTHER): Payer: PPO | Admitting: Family Medicine

## 2023-07-06 ENCOUNTER — Encounter: Payer: Self-pay | Admitting: Family Medicine

## 2023-07-06 VITALS — BP 138/75 | HR 79 | Temp 98.0°F | Ht 77.0 in | Wt 216.0 lb

## 2023-07-06 DIAGNOSIS — E559 Vitamin D deficiency, unspecified: Secondary | ICD-10-CM | POA: Diagnosis not present

## 2023-07-06 DIAGNOSIS — R7303 Prediabetes: Secondary | ICD-10-CM

## 2023-07-06 DIAGNOSIS — E782 Mixed hyperlipidemia: Secondary | ICD-10-CM | POA: Diagnosis not present

## 2023-07-06 DIAGNOSIS — I10 Essential (primary) hypertension: Secondary | ICD-10-CM

## 2023-07-06 LAB — BAYER DCA HB A1C WAIVED: HB A1C (BAYER DCA - WAIVED): 5.5 % (ref 4.8–5.6)

## 2023-07-06 LAB — LIPID PANEL

## 2023-07-06 NOTE — Telephone Encounter (Signed)
 Copied from CRM [id number]. Topic: Clinical - Medical Advice >> Jul 06, 2023  4:12 PM Mosetta Putt H wrote: Reason for CRM: having high blood pressure

## 2023-07-06 NOTE — Progress Notes (Signed)
 Subjective:  Patient ID: William Jefferson, male    DOB: [birth year], 78 y.o.   MRN: [id number]  Patient Care Team: Cathlene Marry Lenis, FNP as PCP - General (Family Medicine) Mona Vinie BROCKS, MD as PCP - Cardiology (Cardiology) Watt Rush, MD as Attending Physician (Urology) Shona Rush, MD (Dermatology)   Chief Complaint:  Medical Management of Chronic Issues   HPI: William Jefferson is a 78 y.o. male presenting on 07/06/2023 for Medical Management of Chronic Issues   HPI 1. Prediabetes States that he has made changes to diet. He is cutting back on sausage, bacon, and toast.  2. Primary hypertension Has BP monitor at home No BP at home average - checks it [facility] and states that he averages 100/70s. Has not taken any medicaitions today.  ROS Denies anxiety, fatigue, peripheral edema, changes to vision, chest pain, headaches, palpitations, sweats, SOB, PND, orthopnea Meds Lisinopril , metoprolol   CAD risks hypertension, hypercholesterolemia/hyperlipidemia  3. Mixed hyperlipidemia Lipid/Cholesterol, Follow-up  Last lipid panel Other pertinent labs  Lab Results  Component Value Date   CHOL 180 01/02/2023   HDL 30 (L) 01/02/2023   LDLCALC 124 (H) 01/02/2023   TRIG 145 01/02/2023   CHOLHDL 6.0 (H) 01/02/2023   Lab Results  Component Value Date   ALT 21 01/02/2023   AST 18 01/02/2023   PLT 245 01/02/2023   TSH 1.310 01/02/2023     He was last seen for this 6 months ago.  Management since that visit includes nexletol    He reports good compliance with treatment. He is having side effects. Weakness in his legs.  Has cardiology appt planned later in January   Symptoms: No chest pain No chest pressure/discomfort  No dyspnea No lower extremity edema  No numbness or tingling of extremity No orthopnea  No palpitations No paroxysmal nocturnal dyspnea  No speech difficulty No syncope   Current diet: in general, an unhealthy diet Coffee, apple juice,  egg, toast, sausage/breakfast  Little debbie for snack  Salad, fried pork chops, black eyed peas  Current exercise: walking  The 10-year ASCVD risk score (Arnett DK, et al., 2019) is: 39.1%  ---------------------------------------------------------------------------------------------------   4. Vitamin D  deficiency Vitamin D  deficiency, follow-up  Lab Results  Component Value Date   VD25OH 23.0 (L) 01/02/2023   CALCIUM  9.5 01/02/2023   CALCIUM  9.8 07/17/2022        Wt Readings from Last 3 Encounters:  07/06/23 216 lb (98 kg)  01/02/23 211 lb (95.7 kg)  12/18/22 210 lb (95.3 kg)    He was last seen for vitamin D  deficiency 6 months ago.  Management since that visit includes nothing.  Symptoms: No change in energy level No numbness or tingling  No bone pain No unexplained fracture   ---------------------------------------------------------------------------------------------------    Relevant past medical, surgical, family, and social history reviewed and updated as indicated.  Allergies and medications reviewed and updated. Data reviewed: Chart in Epic.   Past Medical History:  Diagnosis Date   Bladder stone    removed in 2014 by Dr. Watt   Cataract    Coronary artery disease    Hyperlipidemia    statin intolerance   Hypertension     Past Surgical History:  Procedure Laterality Date   CARDIAC CATHETERIZATION  06/10/1998   large P150 iliac stent to RCA - 3.5x97mm NIR stent (Dr. FABIENE Pinion)   CARDIAC CATHETERIZATION  07/16/2007   mid-distal LAD stenosis, normal LV function, 2.5x20mm Promus stent to obtuse marginal,  2.5x67mm Promus stent to osital & proximal diagonal (Dr. FABIENE Pinion, Dr. MICAEL Ona)   CARDIOVERSION N/A 12/17/2020   Procedure: CARDIOVERSION;  Surgeon: Mona Vinie BROCKS, MD;  Location: [facility] ENDOSCOPY;  Service: Cardiovascular;  Laterality: N/A;   MELANOMA EXCISION  2018   NM MYOCAR PERF WALL MOTION  08/2008   bruce myoview; normal pattern of  perfusion, EF 77%   PROSTATE BIOPSY  2002   TRANSTHORACIC ECHOCARDIOGRAM  08/2012   mild conc LVH    Social History   Socioeconomic History   Marital status: Married    Spouse name: Rollene   Number of children: 3   Years of education: 16   Highest education level: Bachelor's degree (e.g., BA, AB, BS)  Occupational History    Employer: [employer]  Tobacco Use   Smoking status: Former    Current packs/day: 0.00    Average packs/day: 3.0 packs/day for 30.0 years (90.0 ttl pk-yrs)    Types: Cigarettes    Start date: 08/04/1967    Quit date: 08/03/1997    Years since quitting: 25.9   Smokeless tobacco: Never  Vaping Use   Vaping status: Never Used  Substance and Sexual Activity   Alcohol use: Not Currently   Drug use: Not Currently   Sexual activity: Not Currently  Other Topics Concern   Not on file  Social History Narrative   Lives home with wife, family nearby   Social Drivers of Health   Financial Resource Strain: Low Risk  (12/18/2022)   Overall Financial Resource Strain (CARDIA)    Difficulty of Paying Living Expenses: Not hard at all  Food Insecurity: No Food Insecurity (12/18/2022)   Hunger Vital Sign    Worried About Running Out of Food in the Last Year: Never true    Ran Out of Food in the Last Year: Never true  Transportation Needs: No Transportation Needs (12/18/2022)   PRAPARE - Administrator, Civil Service (Medical): No    Lack of Transportation (Non-Medical): No  Physical Activity: Insufficiently Active (12/18/2022)   Exercise Vital Sign    Days of Exercise per Week: 3 days    Minutes of Exercise per Session: 30 min  Stress: No Stress Concern Present (12/18/2022)   [employer] of Occupational Health - Occupational Stress Questionnaire    Feeling of Stress : Not at all  Social Connections: Moderately Integrated (12/18/2022)   Social Connection and Isolation Panel [NHANES]    Frequency of Communication with Friends and Family: More than  three times a week    Frequency of Social Gatherings with Friends and Family: More than three times a week    Attends Religious Services: More than 4 times per year    Active Member of [employer] or Organizations: No    Attends Banker Meetings: Never    Marital Status: Married  Catering Manager Violence: Not At Risk (12/18/2022)   Humiliation, Afraid, Rape, and Kick questionnaire    Fear of Current or Ex-Partner: No    Emotionally Abused: No    Physically Abused: No    Sexually Abused: No    Outpatient Encounter Medications as of 07/06/2023  Medication Sig   Bempedoic Acid  (NEXLETOL ) 180 MG TABS Take 1 tablet (180 mg total) by mouth daily.   cephALEXin (KEFLEX) 500 MG capsule Take 500 mg by mouth daily.   ELIQUIS  5 MG TABS tablet TAKE (1) TABLET TWICE A DAY.   lisinopril  (ZESTRIL ) 5 MG tablet TAKE ONE TABLET DAILY  Lutein 20 MG CAPS Take 20 mg by mouth daily.   nitroGLYCERIN  (NITROSTAT ) 0.4 MG SL tablet PLACE 1 TABLET UNDER THE TONGUE EVERY 5 MINUTES AS NEEDED FOR CHEST PAIN   Saw Palmetto 450 MG CAPS Take 900 mg by mouth daily.   SF 5000 PLUS 1.1 % CREA dental cream Take by mouth.   vitamin C (ASCORBIC ACID) 500 MG tablet Take 500 mg by mouth daily.   metoprolol  tartrate (LOPRESSOR ) 25 MG tablet Take 1 tablet (25 mg total) by mouth 2 (two) times daily for 90 doses.   No facility-administered encounter medications on file as of 07/06/2023.    Allergies  Allergen Reactions   Crestor  [Rosuvastatin ] Other (See Comments)    Weakness in the legs   Lipitor [Atorvastatin]    Penicillins Other (See Comments)    Childhood   Sulfa Antibiotics Other (See Comments)    Childhood   Zetia [Ezetimibe] Other (See Comments)    Weakness in legs   Zocor [Simvastatin] Other (See Comments)    Weakness in legs    Review of Systems As per HPI  Objective:  BP 138/75   Pulse 79   Temp 98 F (36.7 C)   Ht 6' 5 (1.956 m)   Wt 216 lb (98 kg)   SpO2 98%   BMI 25.61 kg/m    Wt  Readings from Last 3 Encounters:  07/06/23 216 lb (98 kg)  01/02/23 211 lb (95.7 kg)  12/18/22 210 lb (95.3 kg)   Physical Exam Constitutional:      General: He is awake. He is not in acute distress.    Appearance: Normal appearance. He is well-developed and well-groomed. He is not ill-appearing, toxic-appearing or diaphoretic.  Cardiovascular:     Rate and Rhythm: Normal rate and regular rhythm.     Pulses: Normal pulses.          Radial pulses are 2+ on the right side and 2+ on the left side.       Posterior tibial pulses are 2+ on the right side and 2+ on the left side.     Heart sounds: Normal heart sounds. No murmur heard.    No gallop.  Pulmonary:     Effort: Pulmonary effort is normal. No respiratory distress.     Breath sounds: Normal breath sounds. No stridor. No wheezing, rhonchi or rales.  Musculoskeletal:     Cervical back: Full passive range of motion without pain and neck supple.     Right lower leg: No edema.     Left lower leg: No edema.  Skin:    General: Skin is warm.     Capillary Refill: Capillary refill takes less than 2 seconds.  Neurological:     General: No focal deficit present.     Mental Status: He is alert, oriented to person, place, and time and easily aroused. Mental status is at baseline.     GCS: GCS eye subscore is 4. GCS verbal subscore is 5. GCS motor subscore is 6.     Motor: No weakness.  Psychiatric:        Attention and Perception: Attention and perception normal.        Mood and Affect: Mood and affect normal.        Speech: Speech normal.        Behavior: Behavior normal. Behavior is cooperative.        Thought Content: Thought content normal. Thought content does not include homicidal or suicidal ideation. Thought  content does not include homicidal or suicidal plan.        Cognition and Memory: Cognition and memory normal.        Judgment: Judgment normal.      Results for orders placed or performed in visit on 01/02/23  Bayer DCA  Hb A1c Waived   Collection Time: 01/02/23  8:03 AM  Result Value Ref Range   HB A1C (BAYER DCA - WAIVED) 5.8 (H) 4.8 - 5.6 %  Lipid panel   Collection Time: 01/02/23  8:05 AM  Result Value Ref Range   Cholesterol, Total 180 100 - 199 mg/dL   Triglycerides 854 0 - 149 mg/dL   HDL 30 (L) >60 mg/dL   VLDL Cholesterol Cal 26 5 - 40 mg/dL   LDL Chol Calc (NIH) 875 (H) 0 - 99 mg/dL   Chol/HDL Ratio 6.0 (H) 0.0 - 5.0 ratio  CBC with Differential/Platelet   Collection Time: 01/02/23  8:05 AM  Result Value Ref Range   WBC 7.6 3.4 - 10.8 x10E3/uL   RBC 5.16 4.14 - 5.80 x10E6/uL   Hemoglobin 15.9 13.0 - 17.7 g/dL   Hematocrit 52.0 62.4 - 51.0 %   MCV 93 79 - 97 fL   MCH 30.8 26.6 - 33.0 pg   MCHC 33.2 31.5 - 35.7 g/dL   RDW 86.8 88.3 - 84.5 %   Platelets 245 150 - 450 x10E3/uL   Neutrophils 62 Not Estab. %   Lymphs 23 Not Estab. %   Monocytes 9 Not Estab. %   Eos 3 Not Estab. %   Basos 1 Not Estab. %   Neutrophils Absolute 4.7 1.4 - 7.0 x10E3/uL   Lymphocytes Absolute 1.8 0.7 - 3.1 x10E3/uL   Monocytes Absolute 0.7 0.1 - 0.9 x10E3/uL   EOS (ABSOLUTE) 0.3 0.0 - 0.4 x10E3/uL   Basophils Absolute 0.1 0.0 - 0.2 x10E3/uL   Immature Granulocytes 2 Not Estab. %   Immature Grans (Abs) 0.1 0.0 - 0.1 x10E3/uL  CMP14+EGFR   Collection Time: 01/02/23  8:05 AM  Result Value Ref Range   Glucose 109 (H) 70 - 99 mg/dL   BUN 14 8 - 27 mg/dL   Creatinine, Ser 8.76 0.76 - 1.27 mg/dL   eGFR 61 >40 fO/fpw/8.26   BUN/Creatinine Ratio 11 10 - 24   Sodium 142 134 - 144 mmol/L   Potassium 4.2 3.5 - 5.2 mmol/L   Chloride 105 96 - 106 mmol/L   CO2 24 20 - 29 mmol/L   Calcium  9.5 8.6 - 10.2 mg/dL   Total Protein 6.6 6.0 - 8.5 g/dL   Albumin 4.2 3.8 - 4.8 g/dL   Globulin, Total 2.4 1.5 - 4.5 g/dL   Bilirubin Total 0.7 0.0 - 1.2 mg/dL   Alkaline Phosphatase 65 44 - 121 IU/L   AST 18 0 - 40 IU/L   ALT 21 0 - 44 IU/L  Vitamin B12   Collection Time: 01/02/23  8:05 AM  Result Value Ref Range   Vitamin  B-12 237 232 - 1,245 pg/mL  Vitamin D , 25-hydroxy   Collection Time: 01/02/23  8:05 AM  Result Value Ref Range   Vit D, 25-Hydroxy 23.0 (L) 30.0 - 100.0 ng/mL  TSH   Collection Time: 01/02/23  8:05 AM  Result Value Ref Range   TSH 1.310 0.450 - 4.500 uIU/mL       01/02/2023    8:03 AM 12/18/2022   11:24 AM 07/17/2022    8:39 AM 07/05/2022    1:53  PM 01/13/2022    8:58 AM  Depression screen PHQ 2/9  Decreased Interest 0 0 0 0 0  Down, Depressed, Hopeless 0 0 0 0 0  PHQ - 2 Score 0 0 0 0 0  Altered sleeping 0  0 0   Tired, decreased energy 0  0 0   Change in appetite 0  0 0   Feeling bad or failure about yourself  0  0 0   Trouble concentrating 0  0 0   Moving slowly or fidgety/restless 0  0 0   Suicidal thoughts 0  0 0   PHQ-9 Score 0  0 0   Difficult doing work/chores Not difficult at all  Not difficult at all Not difficult at all        01/02/2023    8:03 AM 07/17/2022    8:40 AM 07/05/2022    1:54 PM 10/12/2021    4:08 PM  GAD 7 : Generalized Anxiety Score  Nervous, Anxious, on Edge 0 0 0 0  Control/stop worrying 0 0 0 0  Worry too much - different things 0 0 0 0  Trouble relaxing 0 0 0 0  Restless 0 0 0 0  Easily annoyed or irritable 0 0 0 0  Afraid - awful might happen 0 0 0 0  Total GAD 7 Score 0 0 0 0  Anxiety Difficulty Not difficult at all Not difficult at all Not difficult at all Not difficult at all   Pertinent labs & imaging results that were available during my care of the patient were reviewed by me and considered in my medical decision making.  Assessment & Plan:  William Jefferson was seen today for medical management of chronic issues.  Diagnoses and all orders for this visit:  Prediabetes Well controlled. Reviewed lab in office with patient. Praised patient on his diet and lifestyle changes.  -     Bayer DCA Hb A1c Waived  Primary hypertension Not at goal in office today. Patient plans to take medications once he gets home. Plans to go to [facility] pharmacy later  today to check his BP and call with the results. Patient is established with cardiology. Reviewed notes from [place], MD on 07/27/22 -     CMP14+EGFR -     CBC with Differential/Platelet  Mixed hyperlipidemia Labs as below. Will communicate results to patient once available. Will await results to determine next steps.  Fasting unknown.  -     Lipid panel  Vitamin D  deficiency Labs as below. Will communicate results to patient once available. Will await results to determine next steps.  -     VITAMIN D  25 Hydroxy (Vit-D Deficiency, Fractures)   Continue all other maintenance medications.  Follow up plan: Return in about 6 months (around 01/03/2024) for Chronic Condition Follow up.   Continue healthy lifestyle choices, including diet (rich in fruits, vegetables, and lean proteins, and low in salt and simple carbohydrates) and exercise (at least 30 minutes of moderate physical activity daily).  Written and verbal instructions provided   The above assessment and management plan was discussed with the patient. The patient verbalized understanding of and has agreed to the management plan. Patient is aware to call the clinic if they develop any new symptoms or if symptoms persist or worsen. Patient is aware when to return to the clinic for a follow-up visit. Patient educated on when it is appropriate to go to the emergency department.   Marry Kins, DNP-FNP [facility] [address] [place], [place] [address] [phone]

## 2023-07-07 LAB — CMP14+EGFR
ALT: 19 IU/L (ref 0–44)
AST: 16 IU/L (ref 0–40)
Albumin: 4.2 g/dL (ref 3.8–4.8)
Alkaline Phosphatase: 65 IU/L (ref 44–121)
BUN/Creatinine Ratio: 10 (ref 10–24)
BUN: 14 mg/dL (ref 8–27)
Bilirubin Total: 0.8 mg/dL (ref 0.0–1.2)
CO2: 22 mmol/L (ref 20–29)
Calcium: 9.3 mg/dL (ref 8.6–10.2)
Chloride: 105 mmol/L (ref 96–106)
Creatinine, Ser: 1.34 mg/dL — ABNORMAL HIGH (ref 0.76–1.27)
Globulin, Total: 2.3 g/dL (ref 1.5–4.5)
Glucose: 133 mg/dL — ABNORMAL HIGH (ref 70–99)
Potassium: 4.1 mmol/L (ref 3.5–5.2)
Sodium: 141 mmol/L (ref 134–144)
Total Protein: 6.5 g/dL (ref 6.0–8.5)
eGFR: 55 mL/min/{1.73_m2} — ABNORMAL LOW (ref >59)

## 2023-07-07 LAB — CBC WITH DIFFERENTIAL/PLATELET
Basophils Absolute: 0 10*3/uL (ref 0.0–0.2)
Basos: 0 %
EOS (ABSOLUTE): 0.3 10*3/uL (ref 0.0–0.4)
Eos: 4 %
Hematocrit: 48.1 % (ref 37.5–51.0)
Hemoglobin: 16.2 g/dL (ref 13.0–17.7)
Immature Grans (Abs): 0.1 10*3/uL (ref 0.0–0.1)
Immature Granulocytes: 1 %
Lymphocytes Absolute: 1.9 10*3/uL (ref 0.7–3.1)
Lymphs: 21 %
MCH: 31.6 pg (ref 26.6–33.0)
MCHC: 33.7 g/dL (ref 31.5–35.7)
MCV: 94 fL (ref 79–97)
Monocytes Absolute: 0.7 10*3/uL (ref 0.1–0.9)
Monocytes: 8 %
Neutrophils Absolute: 5.8 10*3/uL (ref 1.4–7.0)
Neutrophils: 66 %
Platelets: 243 10*3/uL (ref 150–450)
RBC: 5.13 x10E6/uL (ref 4.14–5.80)
RDW: 13.6 % (ref 11.6–15.4)
WBC: 8.8 10*3/uL (ref 3.4–10.8)

## 2023-07-07 LAB — LIPID PANEL
Cholesterol, Total: 182 mg/dL (ref 100–199)
HDL: 29 mg/dL — ABNORMAL LOW (ref >39)
LDL CALC COMMENT:: 6.3 ratio — ABNORMAL HIGH (ref 0.0–5.0)
LDL Chol Calc (NIH): 132 mg/dL — ABNORMAL HIGH (ref 0–99)
Triglycerides: 113 mg/dL (ref 0–149)
VLDL Cholesterol Cal: 21 mg/dL (ref 5–40)

## 2023-07-07 LAB — VITAMIN D 25 HYDROXY (VIT D DEFICIENCY, FRACTURES): Vit D, 25-Hydroxy: 24.9 ng/mL — ABNORMAL LOW (ref 30.0–100.0)

## 2023-07-10 NOTE — Progress Notes (Signed)
 Vitamin D  slightly low. Recommend patient start OTC supplementation with 800 -2000 international units daily. GFR is reduced, would like patient to repeat labs in 1 month to monitor. Avoid nephrotoxins such as ibuprofen, aleve. Recommend patient increase water intake.  ASCVD risk score is elevated. Diet encouraged - increase intake of fresh fruits and vegetables, increase intake of lean proteins. Bake, broil, or grill foods. Avoid fried, greasy, and fatty foods. Avoid fast foods. Increase intake of fiber-rich whole grains. Exercise encouraged - at least 150 minutes per week and advance as tolerated. Recommend patient start nexletol  from Cardiology and follow up with them.

## 2023-07-12 ENCOUNTER — Other Ambulatory Visit: Payer: Self-pay | Admitting: *Deleted

## 2023-07-12 DIAGNOSIS — R899 Unspecified abnormal finding in specimens from other organs, systems and tissues: Secondary | ICD-10-CM

## 2023-07-23 DIAGNOSIS — R970 Elevated carcinoembryonic antigen [CEA]: Secondary | ICD-10-CM | POA: Diagnosis not present

## 2023-07-26 ENCOUNTER — Ambulatory Visit: Payer: PPO | Attending: Internal Medicine | Admitting: Internal Medicine

## 2023-07-26 ENCOUNTER — Encounter: Payer: Self-pay | Admitting: Internal Medicine

## 2023-07-26 VITALS — BP 120/68 | HR 60 | Ht 77.0 in | Wt 212.2 lb

## 2023-07-26 DIAGNOSIS — E785 Hyperlipidemia, unspecified: Secondary | ICD-10-CM | POA: Diagnosis not present

## 2023-07-26 DIAGNOSIS — I2510 Atherosclerotic heart disease of native coronary artery without angina pectoris: Secondary | ICD-10-CM | POA: Diagnosis not present

## 2023-07-26 DIAGNOSIS — I10 Essential (primary) hypertension: Secondary | ICD-10-CM | POA: Diagnosis not present

## 2023-07-26 NOTE — Progress Notes (Signed)
OFFICE NOTE  Chief Complaint:  Routine follow-up  Primary Care Physician: Arrie Senate, FNP  HPI:  William Jefferson is a pleasant 78 year old male formerly followed by Dr. Alanda Amass. He is here today to establish new cardiac care. His past medical history is significant for coronary artery disease. In 1999 he was having symptoms of angina and presented to her primary care physician who performed an EKG and sent him to the emergency room. There he will have angiography and was found to have a ostial right coronary artery lesion of a very large vessel. He ultimately had placement of an iliac stent, as no coronary stents of that size were available at the time. This is actually stayed very patent for long period of time. In 2009 he had another episode of chest pain and was found to have circumflex and diagonal lesions which were stented with drug-eluting stents. The OM1 was stented with a 2 5 x 10 mm Promus stent that was postdilated to 3.0 mm.  The diagonal was a 2 5 x 18 mm Promus stent that there was also postdilated to 2.75 mm. He's done well since then without any chest pain complaints. 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 He still works 4 days a week and has not retired. He denies any chest pain or shortness of breath. Unfortunately been intolerant to all statins and Zetia. He is unwilling to take any more of those medicines as is found in its cause leg pain, weakness and difficulty performing his job. He is on monotherapy Plavix, but not aspirin, but denies any history of allergy aspirin. His only other issue is a rising PSA. He said a number of prostate biopsies and actually developed an infection at one time. He's had atypical cells but no determinant prostate cancer.  I saw William Jefferson back today. He continues to be without complaints. 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 He does have a history of being intolerant  to at least 5 or 6 different statins. He's not as cholesterol checked in about 4 years. We discussed the new medications that are now out to treat cholesterol a said that he be willing to try at least a low-dose statin is to be on a qualifying dose of max tolerated therapy. We would either have to recheck his cholesterol level.  William Jefferson returns today for follow-up. Overall he is doing well and is asymptomatic. He is started taking pravastatin 1-2 times a week which is certainly better than nothing. He's been intolerant of all other statins including Zetia. He's managed to lose a couple pounds but generally hovers around 215. Blood pressure is well-controlled.  08/21/2016  William Jefferson is seen today in follow-up. Since I last saw him he had tried to take pravastatin as he's been intolerant to Crestor 20 mg, Lipitor 40 mg, Zetia 10 mg and Zocor 40 mg doses in the past due to myalgias. He was placed on pravastatin 40 mg with additional symptoms and decreased it to 3 times weekly and eventually stopped the medication. He continues to have elevated cholesterol with LDL-C greater than 150. He has known coronary artery disease with prior PCI in 1999 and again in 2009. We discussed today additional treatment options including PC SK 9 inhibitors and potentially enrolling him in the Orion-10 trial.  08/03/2017  William Jefferson was seen today in follow-up.  Overall he seems to be doing well.  Unfortunately he has been intolerant to number statins.  He declined the [employer] 10 trial.  We discussed the PCS K9 inhibitors today however is concerned about the injectables.  He asked about what pill options may be available.  I mentioned that he may be a candidate for the CLEAR trial.  He said he may be interested in this.  08/08/2018  William Jefferson is seen today in follow-up.  He is again without complaints.  He continues to work Data processing manager.  He has been involved in a research trial with bempedoic acid (clear trial).  Is not  clear whether he is on placebo or treatment.  So far he seems to be tolerating things.  That study may conclude earlier this year as there is likely going to be release of the medication to market.  Otherwise he is without complaints.  No chest pain no shortness of breath.  09/12/2019  William Jefferson is seen today for follow-up.  He continues to do well.  Nuys any chest pain or worsening shortness of breath.  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  He still enrolled in the clear trial.  I will reach out to the study coordinators to see whether or not he has crossed over to treatment or is enrolled in the clear outcomes trial.  He was made aware that the trial compound bempedoic acid is commercially available as Nexletol.  11/08/2020  William Jefferson returns today for follow-up.  He was seen in January by my partner Dr. Flora Lipps for an acute add-on for preoperative visit.  He was found to be in rate controlled A. fib at the time.  He was appropriately switched to Eliquis and an echocardiogram was performed.  This showed normal systolic function with no evidence of atrial enlargement.  He was supposed to have a planned right knee surgery ultimately that was not performed.  He returns today for follow-up.  His EKG shows he is in persistent A. fib with frequent PVCs in a quadrigeminal pattern.  He reports he has missed several doses of Eliquis over the past several months.  We discussed options including continuing his rate control or a possibility of an elective cardioversion.  Although he says he denies any chest pain or fatigue, it was unclear to him how long he may have been in A. fib.  07/06/2021  William Jefferson returns today for follow-up.  He recently saw Gillian Shields, NP for follow-up of his cardioversion.  He has been maintaining sinus rhythm.  He says he feels well.  He is on aspirin and Eliquis and denies any bleeding issues.  Blood pressure is well controlled.  We again  discussed his dyslipidemia.  He has been intolerant to numerous statins and ezetimibe and had been in the clear bempedoic acid trial but he was unfortunately on placebo.  The study ended in June to 2022.  We discussed other options including PCSK9 inhibitors, Leqvio and bempedoic acid (Nexletol).  He would have to consider the financial implications of these and wants to think about it more.  07/27/2022  William Jefferson is seen today in follow-up.  Overall he says he is feeling well.  Denies any chest pain or shortness of breath.  He is not aware of any recurrent atrial fibrillation.  He says his heart rate seems to be stable.  EKG today shows sinus rhythm at 62 with some sinus arrhythmia.  Blood pressure was a little low at 98/60 however he  is not symptomatic.  He says this happens from time to time.  His cholesterol however has been trending upwards.  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  Total cholesterol now 199, triglycerides 105, HDL 33 and LDL 147.  With coronary disease his target LDL is less than 70.  He notes some easy bleeding and bruising on combination aspirin and Eliquis.  His last coronary intervention was 2009.  07/26/2023  William Jefferson returns today for follow-up.  Overall he says he is feeling pretty well.  He denies any chest pain or shortness of breath.  He has not had any recurrent atrial fibrillation.  He recently saw his primary care provider and the blood pressure was noted to be elevated however he checked it daily for about a week and it seems to have improved.  Today the blood pressure was 120/68.  He has been infrequently using Nexletol.  He thinks he may have some side effects similar to the statins.  He has recent lipids this month showed total cholesterol 182, HDL 29, triglycerides 113 and LDL 132, with a target LDL less than 70.  He is noted to have some hearing loss but has not yet investigated getting hearing aids.  PMHx:  Past Medical  History:  Diagnosis Date   Bladder stone    removed in 2014 by Dr. Annabell Howells   Cataract    Coronary artery disease    Hyperlipidemia    statin intolerance   Hypertension     Past Surgical History:  Procedure Laterality Date   CARDIAC CATHETERIZATION  06/10/1998   large P150 iliac stent to RCA - 3.5x68mm NIR stent (Dr. Jonette Eva)   CARDIAC CATHETERIZATION  07/16/2007   mid-distal LAD stenosis, normal LV function, 2.5x49mm Promus stent to obtuse marginal, 2.5x37mm Promus stent to osital & proximal diagonal (Dr. Jonette Eva, Dr. Aram Candela)   CARDIOVERSION N/A 12/17/2020   Procedure: CARDIOVERSION;  Surgeon: Chrystie Nose, MD;  Location: [facility];  Service: Cardiovascular;  Laterality: N/A;   MELANOMA EXCISION  2018   NM MYOCAR PERF WALL MOTION  08/2008   bruce myoview; normal pattern of perfusion, EF 77%   PROSTATE BIOPSY  2002   TRANSTHORACIC ECHOCARDIOGRAM  08/2012   mild conc LVH    FAMHx:  Family History  Problem Relation Age of Onset   Cancer Mother        breast   Emphysema Father    Cirrhosis Brother    Alcohol abuse Brother     SOCHx:   reports that he quit smoking about 25 years ago. His smoking use included cigarettes. He started smoking about 56 years ago. He has a 90 pack-year smoking history. He has never used smokeless tobacco. He reports that he does not currently use alcohol. He reports that he does not currently use drugs.  ALLERGIES:  Allergies  Allergen Reactions   Crestor [Rosuvastatin] Other (See Comments)    Weakness in the legs   Lipitor [Atorvastatin]    Penicillins Other (See Comments)    Childhood   Sulfa Antibiotics Other (See Comments)    Childhood   Zetia [Ezetimibe] Other (See Comments)    Weakness in legs   Zocor [Simvastatin] Other (See Comments)    Weakness in legs    ROS: Pertinent items noted in HPI and remainder of comprehensive ROS otherwise negative.  HOME MEDS: Current Outpatient Medications  Medication Sig Dispense  Refill   Bempedoic Acid (  NEXLETOL) 180 MG TABS Take 1 tablet (180 mg total) by mouth daily. 30 tablet 11   cephALEXin (KEFLEX) 500 MG capsule Take 500 mg by mouth daily.     ELIQUIS 5 MG TABS tablet TAKE (1) TABLET TWICE A DAY. 60 tablet 5   lisinopril (ZESTRIL) 5 MG tablet TAKE ONE TABLET DAILY 90 tablet 0   Lutein 20 MG CAPS Take 20 mg by mouth daily.     nitroGLYCERIN (NITROSTAT) 0.4 MG SL tablet PLACE 1 TABLET UNDER THE TONGUE EVERY 5 MINUTES AS NEEDED FOR CHEST PAIN 25 tablet 3   Saw Palmetto 450 MG CAPS Take 900 mg by mouth daily.     SF 5000 PLUS 1.1 % CREA dental cream Take by mouth.     vitamin C (ASCORBIC ACID) 500 MG tablet Take 500 mg by mouth daily.     metoprolol tartrate (LOPRESSOR) 25 MG tablet Take 1 tablet (25 mg total) by mouth 2 (two) times daily for 90 doses. 60 tablet 2   No current facility-administered medications for this visit.    LABS/IMAGING: No results found for this or any previous visit (from the past 48 hours). No results found.  VITALS: BP 120/68 (BP Location: Left Arm, Patient Position: Sitting)   Pulse 60   Ht 6\' 5"  (1.956 m)   Wt 212 lb 3.2 oz (96.3 kg)   SpO2 95%   BMI 25.16 kg/m   EXAM: General appearance: alert and no distress Neck: no carotid bruit and no JVD Lungs: clear to auscultation bilaterally Heart: regular rate and rhythm, S1, S2 normal, no murmur, click, rub or gallop Abdomen: soft, non-tender; bowel sounds normal; no masses,  no organomegaly Extremities: extremities normal, atraumatic, no cyanosis or edema Pulses: 2+ and symmetric Skin: Skin color, texture, turgor normal. No rashes or lesions Neurologic: Grossly normal Psych: Mood, affect normal  EKG: EKG Interpretation Date/Time:  Thursday July 26 2023 08:05:45 EST Ventricular Rate:  60 PR Interval:  176 QRS Duration:  88 QT Interval:  404 QTC Calculation: 404 R Axis:   6  Text Interpretation: Sinus rhythm with Premature atrial complexes Low voltage QRS When  compared with ECG of 17-Dec-2020 10:33, No significant change was found Confirmed by Zoila Shutter [phone]) on 07/26/2023 8:15:07 AM    ASSESSMENT: Coronary artery disease status post PCI to the ostial RCA in 1999, proximal diagonal and circumflex marginal in 2009 Persistent atrial fibrillation -maintaining sinus rhythm after cardioversion on Eliquis Dyslipidemia-intolerant to statins and Zetia Hypertension-controlled Elevated PSA - due to prostatitis CLINICAL TRIAL (CLEAR, bempedoic acid)-ended in 2022  PLAN: 1.   Mr. Landfair seems to be well without any chest pain or worsening shortness of breath.  His lipids are not at target with an LDL goal less than 70.  He is on Nexletol but is not taking it as often as he ideally should.  He said he would like to try to take that more regularly now and we will plan to repeat lipid in about 3 months.  I did discuss possibility of PCSK9 inhibitor therapy as he has a number of other intolerances including statins and ezetimibe but he is not interested at this time.  Plan follow-up with me otherwise annually or sooner as necessary.  Chrystie Nose, MD, [facility], FACP  [place]  [facility]  Medical Director of the Advanced Lipid Disorders &  Cardiovascular Risk Reduction Clinic Diplomate of the American Board of Clinical Lipidology Attending Cardiologist  Direct Dial: [phone]  Fax: [phone]  Website:  [email or web address]  Lisette Abu Briauna Gilmartin 07/26/2023, 8:15 AM

## 2023-07-26 NOTE — Patient Instructions (Signed)
Medication Instructions:  Your physician recommends that you continue on your current medications as directed. Please refer to the Current Medication list given to you today.    *If you need a refill on your cardiac medications before your next appointment, please call your pharmacy*   Lab Work: Lipid panel in 3 months    If you have labs (blood work) drawn today and your tests are completely normal, you will receive your results only by: MyChart Message (if you have MyChart) OR A paper copy in the mail If you have any lab test that is abnormal or we need to change your treatment, we will call you to review the results.   Testing/Procedures: None    Follow-Up: At [facility], you and your health needs are our priority.  As part of our continuing mission to provide you with exceptional heart care, we have created designated Provider Care Teams.  These Care Teams include your primary Cardiologist (physician) and Advanced Practice Providers (APPs -  Physician Assistants and Nurse Practitioners) who all work together to provide you with the care you need, when you need it.  We recommend signing up for the patient portal called "MyChart".  Sign up information is provided on this After Visit Summary.  MyChart is used to connect with patients for Virtual Visits (Telemedicine).  Patients are able to view lab/test results, encounter notes, upcoming appointments, etc.  Non-urgent messages can be sent to your provider as well.   To learn more about what you can do with MyChart, go to [email or web address].    Your next appointment:   1 year(s)  The format for your next appointment:   In Person  Provider:   Chrystie Nose, MD    Other Instructions

## 2023-07-30 DIAGNOSIS — R972 Elevated prostate specific antigen [PSA]: Secondary | ICD-10-CM | POA: Diagnosis not present

## 2023-07-30 DIAGNOSIS — N400 Enlarged prostate without lower urinary tract symptoms: Secondary | ICD-10-CM | POA: Diagnosis not present

## 2023-07-30 DIAGNOSIS — N411 Chronic prostatitis: Secondary | ICD-10-CM | POA: Diagnosis not present

## 2023-08-13 ENCOUNTER — Other Ambulatory Visit: Payer: PPO

## 2023-08-13 DIAGNOSIS — R899 Unspecified abnormal finding in specimens from other organs, systems and tissues: Secondary | ICD-10-CM

## 2023-08-14 ENCOUNTER — Encounter: Payer: Self-pay | Admitting: Family Medicine

## 2023-08-14 LAB — CMP14+EGFR
ALT: 22 [IU]/L (ref 0–44)
AST: 18 [IU]/L (ref 0–40)
Albumin: 4.2 g/dL (ref 3.8–4.8)
Alkaline Phosphatase: 71 [IU]/L (ref 44–121)
BUN/Creatinine Ratio: 13 (ref 10–24)
BUN: 16 mg/dL (ref 8–27)
Bilirubin Total: 0.7 mg/dL (ref 0.0–1.2)
CO2: 22 mmol/L (ref 20–29)
Calcium: 9.4 mg/dL (ref 8.6–10.2)
Chloride: 105 mmol/L (ref 96–106)
Creatinine, Ser: 1.24 mg/dL (ref 0.76–1.27)
Globulin, Total: 2.5 g/dL (ref 1.5–4.5)
Glucose: 107 mg/dL — ABNORMAL HIGH (ref 70–99)
Potassium: 4.6 mmol/L (ref 3.5–5.2)
Sodium: 142 mmol/L (ref 134–144)
Total Protein: 6.7 g/dL (ref 6.0–8.5)
eGFR: 60 mL/min/{1.73_m2} (ref >59)

## 2023-08-14 NOTE — Progress Notes (Signed)
Renal function improved from previous. Recommend patient continue to avoid medications that are harmful to kidneys such as ibuprofen, aleve. Recommend adequate hydration with 80-100 oz of water daily.

## 2023-09-06 DIAGNOSIS — L820 Inflamed seborrheic keratosis: Secondary | ICD-10-CM | POA: Diagnosis not present

## 2023-09-06 DIAGNOSIS — D225 Melanocytic nevi of trunk: Secondary | ICD-10-CM | POA: Diagnosis not present

## 2023-09-06 DIAGNOSIS — Z85820 Personal history of malignant melanoma of skin: Secondary | ICD-10-CM | POA: Diagnosis not present

## 2023-09-06 DIAGNOSIS — Z08 Encounter for follow-up examination after completed treatment for malignant neoplasm: Secondary | ICD-10-CM | POA: Diagnosis not present

## 2023-09-06 DIAGNOSIS — Z1283 Encounter for screening for malignant neoplasm of skin: Secondary | ICD-10-CM | POA: Diagnosis not present

## 2023-09-14 DIAGNOSIS — H40033 Anatomical narrow angle, bilateral: Secondary | ICD-10-CM | POA: Diagnosis not present

## 2023-09-14 DIAGNOSIS — H2513 Age-related nuclear cataract, bilateral: Secondary | ICD-10-CM | POA: Diagnosis not present

## 2023-09-25 ENCOUNTER — Other Ambulatory Visit: Payer: Self-pay | Admitting: Family Medicine

## 2023-09-25 ENCOUNTER — Other Ambulatory Visit: Payer: Self-pay | Admitting: Internal Medicine

## 2023-09-25 DIAGNOSIS — I10 Essential (primary) hypertension: Secondary | ICD-10-CM

## 2023-09-25 DIAGNOSIS — I4819 Other persistent atrial fibrillation: Secondary | ICD-10-CM

## 2023-09-25 NOTE — Telephone Encounter (Signed)
 Eliquis 5mg  refill request received. Patient is 78 years old, weight-96.3kg, Crea-1.24 on 08/13/23, Diagnosis-Afib, and last seen by Dr. Rennis Golden on 07/26/23. Dose is appropriate based on dosing criteria. Will send in refill to requested pharmacy.

## 2023-10-01 ENCOUNTER — Other Ambulatory Visit: Payer: Self-pay | Admitting: *Deleted

## 2023-10-01 DIAGNOSIS — I2510 Atherosclerotic heart disease of native coronary artery without angina pectoris: Secondary | ICD-10-CM

## 2023-10-01 DIAGNOSIS — E785 Hyperlipidemia, unspecified: Secondary | ICD-10-CM

## 2023-10-03 ENCOUNTER — Other Ambulatory Visit: Payer: Self-pay | Admitting: Internal Medicine

## 2023-10-29 ENCOUNTER — Other Ambulatory Visit

## 2023-10-29 DIAGNOSIS — E785 Hyperlipidemia, unspecified: Secondary | ICD-10-CM | POA: Diagnosis not present

## 2023-10-29 DIAGNOSIS — I2510 Atherosclerotic heart disease of native coronary artery without angina pectoris: Secondary | ICD-10-CM | POA: Diagnosis not present

## 2023-10-30 LAB — LIPID PANEL
Chol/HDL Ratio: 5.2 ratio — ABNORMAL HIGH (ref 0.0–5.0)
Cholesterol, Total: 146 mg/dL (ref 100–199)
HDL: 28 mg/dL — ABNORMAL LOW (ref >39)
LDL Chol Calc (NIH): 99 mg/dL (ref 0–99)
Triglycerides: 102 mg/dL (ref 0–149)
VLDL Cholesterol Cal: 19 mg/dL (ref 5–40)

## 2023-11-07 ENCOUNTER — Telehealth: Payer: Self-pay

## 2023-11-07 NOTE — Telephone Encounter (Signed)
 Cancelled upcoming appt with William Jefferson since her last day at [facility] will be on 6/27. I then scheduled pt to re-establish care with William Jefferson in Sept 2025 (first available).   Spoke with wife about this and she Jefferson she would let pt know. Advised for pt to call the office and speak with me directly if needed.

## 2023-11-07 NOTE — Telephone Encounter (Signed)
 Copied from CRM [id number]. Topic: Appointments - Appointment Scheduling >> Nov 07, 2023 10:10 AM Emylou G wrote: Pls call patient?  He is wondering who will take over his appt  Jan 08 2024 08:05 AM - Office Visit Since Chrystine Crate, FNP is leaving in June or will it be rescheduled.Aaron Aas His number on file is good. >> Nov 07, 2023 10:12 AM Emylou G wrote: He adv can send message on mychart

## 2023-12-19 ENCOUNTER — Ambulatory Visit: Payer: PPO

## 2023-12-19 VITALS — BP 120/68 | HR 60 | Ht 77.0 in | Wt 212.0 lb

## 2023-12-19 DIAGNOSIS — Z0000 Encounter for general adult medical examination without abnormal findings: Secondary | ICD-10-CM | POA: Diagnosis not present

## 2023-12-19 NOTE — Patient Instructions (Signed)
 Mr. William Jefferson , Thank you for taking time out of your busy schedule to complete your Annual Wellness Visit with me. I enjoyed our conversation and look forward to speaking with you again next year. I, as well as your care team,  appreciate your ongoing commitment to your health goals. Please review the following plan we discussed and let me know if I can assist you in the future. Your Game plan/ To Do List   Follow up Visits: Next Medicare AWV with our clinical staff: 12/19/24 at 10:40a.m.    Next Office Visit with your provider: 03/27/24 at 2p.m.  Clinician Recommendations:  Aim for 30 minutes of exercise or brisk walking, 6-8 glasses of water, and 5 servings of fruits and vegetables each day. Please discuss with your provider about getting your ears flushed/cleaned out plus get a hearing test done at your next visit.       This is a list of the screening recommended for you and due dates:  Health Maintenance  Topic Date Due   Pneumococcal Vaccine for age over 20 (1 of 2 - PCV) 01/02/2024*   Hepatitis C Screening  01/02/2024*   COVID-19 Vaccine (4 - 2024-25 season) 01/03/2025*   Flu Shot  02/01/2024   Medicare Annual Wellness Visit  12/18/2024   DTaP/Tdap/Td vaccine (3 - Td or Tdap) 09/18/2030   Zoster (Shingles) Vaccine  Completed   HPV Vaccine  Aged Out   Meningitis B Vaccine  Aged Out  *Topic was postponed. The date shown is not the original due date.    Advanced directives: (Declined) Advance directive discussed with you today. Even though you declined this today, please call our office should you change your mind, and we can give you the proper paperwork for you to fill out. Advance Care Planning is important because it:  [x]  Makes sure you receive the medical care that is consistent with your values, goals, and preferences  [x]  It provides guidance to your family and loved ones and reduces their decisional burden about whether or not they are making the right decisions based on your  wishes.  Follow the link provided in your after visit summary or read over the paperwork we have mailed to you to help you started getting your Advance Directives in place. If you need assistance in completing these, please reach out to us  so that we can help you!  See attachments for Preventive Care and Fall Prevention Tips.

## 2023-12-19 NOTE — Progress Notes (Signed)
 Subjective:   William Jefferson is a 78 y.o. who presents for a Medicare Wellness preventive visit.  As a reminder, Annual Wellness Visits don't include a physical exam, and some assessments may be limited, especially if this visit is performed virtually. We may recommend an in-person follow-up visit with your provider if needed.  Visit Complete: Virtual I connected with  William Jefferson on 12/19/23 by a audio enabled telemedicine application and verified that I am speaking with the correct person using two identifiers.  Patient Location: Home  Provider Location: Home Office  I discussed the limitations of evaluation and management by telemedicine. The patient expressed understanding and agreed to proceed.  Vital Signs: Because this visit was a virtual/telehealth visit, some criteria may be missing or patient reported. Any vitals not documented were not able to be obtained and vitals that have been documented are patient reported.  VideoDeclined- This patient declined Librarian, academic. Therefore the visit was completed with audio only.  Persons Participating in Visit: Patient.  AWV Questionnaire: No: Patient Medicare AWV questionnaire was not completed prior to this visit.  Cardiac Risk Factors include: advanced age (>59men, >74 women);dyslipidemia;hypertension;male gender;Other (see comment), Risk factor comments: CAD     Objective:    Today's Vitals   12/19/23 1125  BP: 120/68  Pulse: 60  Weight: 212 lb (96.2 kg)  Height: 6' 5 (1.956 m)   Body mass index is 25.14 kg/m.     12/19/2023    9:40 AM 12/18/2022   11:27 AM 12/14/2021   11:27 AM 12/17/2020    9:03 AM 12/13/2020   11:35 AM  Advanced Directives  Does Patient Have a Medical Advance Directive? No No No No No  Would patient like information on creating a medical advance directive?  No - Patient declined No - Patient declined No - Patient declined No - Patient declined    Current  Medications (verified) Outpatient Encounter Medications as of 12/19/2023  Medication Sig   Bempedoic Acid  (NEXLETOL ) 180 MG TABS TAKE ONE TABLET BY MOUTH DAILY   cephALEXin (KEFLEX) 500 MG capsule Take 500 mg by mouth daily.   ELIQUIS  5 MG TABS tablet TAKE (1) TABLET TWICE A DAY.   lisinopril  (ZESTRIL ) 5 MG tablet TAKE ONE TABLET DAILY   Lutein 20 MG CAPS Take 20 mg by mouth daily.   metoprolol  tartrate (LOPRESSOR ) 25 MG tablet Take 1 tablet (25 mg total) by mouth 2 (two) times daily for 90 doses.   nitroGLYCERIN  (NITROSTAT ) 0.4 MG SL tablet PLACE 1 TABLET UNDER THE TONGUE EVERY 5 MINUTES AS NEEDED FOR CHEST PAIN   Saw Palmetto 450 MG CAPS Take 900 mg by mouth daily.   SF 5000 PLUS 1.1 % CREA dental cream Take by mouth.   vitamin C (ASCORBIC ACID) 500 MG tablet Take 500 mg by mouth daily.   No facility-administered encounter medications on file as of 12/19/2023.    Allergies (verified) Crestor  [rosuvastatin ], Lipitor [atorvastatin], Penicillins, Sulfa antibiotics, Zetia [ezetimibe], and Zocor [simvastatin]   History: Past Medical History:  Diagnosis Date   Bladder stone    removed in 2014 by Dr. Inga Manges   Cataract    Coronary artery disease    Hyperlipidemia    statin intolerance   Hypertension    Past Surgical History:  Procedure Laterality Date   CARDIAC CATHETERIZATION  06/10/1998   large P150 iliac stent to RCA - 3.5x32mm NIR stent (Dr. Savilla Curls)   CARDIAC CATHETERIZATION  07/16/2007   mid-distal  LAD stenosis, normal LV function, 2.5x68mm Promus stent to obtuse marginal, 2.5x67mm Promus stent to osital & proximal diagonal (Dr. Savilla Curls, Dr. Laymon Priest)   CARDIOVERSION N/A 12/17/2020   Procedure: CARDIOVERSION;  Surgeon: Hazle Lites, MD;  Location: [facility];  Service: Cardiovascular;  Laterality: N/A;   MELANOMA EXCISION  2018   NM MYOCAR PERF WALL MOTION  08/2008   bruce myoview; normal pattern of perfusion, EF 77%   PROSTATE BIOPSY  2002   TRANSTHORACIC  ECHOCARDIOGRAM  08/2012   mild conc LVH   Family History  Problem Relation Age of Onset   Cancer Mother        breast   Emphysema Father    Cirrhosis Brother    Alcohol abuse Brother    Social History   Socioeconomic History   Marital status: Married    Spouse name: Daivd Dub   Number of children: 3   Years of education: 16   Highest education level: Bachelor's degree (e.g., BA, AB, BS)  Occupational History    Employer: [employer]  Tobacco Use   Smoking status: Former    Current packs/day: 0.00    Average packs/day: 3.0 packs/day for 30.0 years (90.0 ttl pk-yrs)    Types: Cigarettes    Start date: 08/04/1967    Quit date: 08/03/1997    Years since quitting: 26.3   Smokeless tobacco: Never  Vaping Use   Vaping status: Never Used  Substance and Sexual Activity   Alcohol use: Not Currently   Drug use: Not Currently   Sexual activity: Not Currently  Other Topics Concern   Not on file  Social History Narrative   Lives home with wife, family nearby   Social Drivers of Health   Financial Resource Strain: Low Risk  (12/19/2023)   Overall Financial Resource Strain (CARDIA)    Difficulty of Paying Living Expenses: Not hard at all  Food Insecurity: No Food Insecurity (12/19/2023)   Hunger Vital Sign    Worried About Running Out of Food in the Last Year: Never true    Ran Out of Food in the Last Year: Never true  Transportation Needs: No Transportation Needs (12/19/2023)   PRAPARE - Administrator, Civil Service (Medical): No    Lack of Transportation (Non-Medical): No  Physical Activity: Insufficiently Active (12/19/2023)   Exercise Vital Sign    Days of Exercise per Week: 3 days    Minutes of Exercise per Session: 30 min  Stress: No Stress Concern Present (12/19/2023)   [employer] of Occupational Health - Occupational Stress Questionnaire    Feeling of Stress: Not at all  Social Connections: Moderately Integrated (12/19/2023)   Social Connection and  Isolation Panel    Frequency of Communication with Friends and Family: More than three times a week    Frequency of Social Gatherings with Friends and Family: More than three times a week    Attends Religious Services: More than 4 times per year    Active Member of [employer] or Organizations: No    Attends Banker Meetings: Never    Marital Status: Married    Tobacco Counseling Counseling given: Yes    Clinical Intake:  Pre-visit preparation completed: Yes  Pain : No/denies pain     Nutritional Risks: None Diabetes: No  Lab Results  Component Value Date   HGBA1C 5.5 07/06/2023   HGBA1C 5.8 (H) 01/02/2023   HGBA1C 6.0 (H) 07/17/2022     How often do you  need to have someone help you when you read instructions, pamphlets, or other written materials from your doctor or pharmacy?: 1 - Never  Interpreter Needed?: No  Information entered by :: alia t/cma   Activities of Daily Living     12/19/2023    9:38 AM  In your present state of health, do you have any difficulty performing the following activities:  Hearing? 0  Vision? 0  Difficulty concentrating or making decisions? 0  Walking or climbing stairs? 0  Dressing or bathing? 0  Doing errands, shopping? 0  Preparing Food and eating ? N  Using the Toilet? N  In the past six months, have you accidently leaked urine? N  Do you have problems with loss of bowel control? N  Managing your Medications? N  Managing your Finances? N  Housekeeping or managing your Housekeeping? N    Patient Care Team: Milian, Winda Hastings, FNP as PCP - General (Family Medicine) Maximo Spar Aviva Lemmings, MD as PCP - Cardiology (Cardiology) Homero Luster, MD as Attending Physician (Urology) Denman Fischer, MD (Dermatology)  I have updated your Care Teams any recent Medical Services you may have received from other providers in the past year.     Assessment:   This is a routine wellness examination for William Jefferson.  Hearing/Vision  screen Hearing Screening - Comments:: Pt stated yes--suggest to have hearing test/clean out Vision Screening - Comments:: Pt wear reading glasses prn/denies vision/pt goes to [facility] in [place]/last 1/25   Goals Addressed             This Visit's Progress    Exercise 3x per week (30 min per time)   On track      Depression Screen     12/19/2023    9:41 AM 01/02/2023    8:03 AM 12/18/2022   11:24 AM 07/17/2022    8:39 AM 07/05/2022    1:53 PM 01/13/2022    8:58 AM 12/14/2021   11:21 AM  PHQ 2/9 Scores  PHQ - 2 Score 0 0 0 0 0 0 0  PHQ- 9 Score 0 0  0 0      Fall Risk     12/19/2023    9:34 AM 01/02/2023    8:03 AM 12/18/2022   11:22 AM 07/17/2022    8:44 AM 07/05/2022    1:53 PM  Fall Risk   Falls in the past year? 0 0 0 0 0  Number falls in past yr: 0 0 0 0 0  Injury with Fall? 0 0 0 0 0  Risk for fall due to : No Fall Risks History of fall(s) No Fall Risks No Fall Risks No Fall Risks  Follow up Falls evaluation completed Falls evaluation completed Falls prevention discussed Education provided  Education provided;Falls evaluation completed      Data saved with a previous flowsheet row definition    MEDICARE RISK AT HOME:  Medicare Risk at Home Any stairs in or around the home?: Yes If so, are there any without handrails?: Yes Home free of loose throw rugs in walkways, pet beds, electrical cords, etc?: Yes Adequate lighting in your home to reduce risk of falls?: Yes Life alert?: No Use of a cane, walker or w/c?: Yes (use a cane prn) Grab bars in the bathroom?: No Shower chair or bench in shower?: No Elevated toilet seat or a handicapped toilet?: No  TIMED UP AND GO:  Was the test performed?  no  Cognitive Function: 6CIT completed  12/19/2023    9:43 AM 12/18/2022   11:27 AM 12/14/2021   11:21 AM 12/13/2020   11:33 AM  6CIT Screen  What Year? 0 points 0 points 0 points 0 points  What month? 0 points 0 points 0 points 0 points  What time? 0 points 0 points  0 points 0 points  Count back from 20 0 points 0 points 0 points 0 points  Months in reverse 0 points 0 points 0 points 0 points  Repeat phrase 0 points 0 points 0 points 0 points  Total Score 0 points 0 points 0 points 0 points    Immunizations Immunization History  Administered Date(s) Administered   PFIZER(Purple Top)SARS-COV-2 Vaccination 07/29/2019, 08/19/2019, 04/16/2020   Td 06/02/1998   Tdap 09/17/2020   Zoster Recombinant(Shingrix ) 01/13/2022, 01/02/2023   Zoster, Live 03/31/2016    Screening Tests Health Maintenance  Topic Date Due   Pneumococcal Vaccine: 50+ Years (1 of 2 - PCV) 01/02/2024 (Originally 04/29/1965)   Hepatitis C Screening  01/02/2024 (Originally 04/29/1964)   COVID-19 Vaccine (4 - 2024-25 season) 01/03/2025 (Originally 03/04/2023)   INFLUENZA VACCINE  02/01/2024   Medicare Annual Wellness (AWV)  12/18/2024   DTaP/Tdap/Td (3 - Td or Tdap) 09/18/2030   Zoster Vaccines- Shingrix   Completed   HPV VACCINES  Aged Out   Meningococcal B Vaccine  Aged Out    Health Maintenance  There are no preventive care reminders to display for this patient.  Health Maintenance Items Addressed: See Nurse Notes at the end of this note  Additional Screening:  Vision Screening: Recommended annual ophthalmology exams for early detection of glaucoma and other disorders of the eye. Would you like a referral to an eye doctor? No    Dental Screening: Recommended annual dental exams for proper oral hygiene  Community Resource Referral / Chronic Care Management: CRR required this visit?  No   CCM required this visit?  No   Plan:    I have personally reviewed and noted the following in the patient's chart:   Medical and social history Use of alcohol, tobacco or illicit drugs  Current medications and supplements including opioid prescriptions. Patient is not currently taking opioid prescriptions. Functional ability and status Nutritional status Physical  activity Advanced directives List of other physicians Hospitalizations, surgeries, and ER visits in previous 12 months Vitals Screenings to include cognitive, depression, and falls Referrals and appointments  In addition, I have reviewed and discussed with patient certain preventive protocols, quality metrics, and best practice recommendations. A written personalized care plan for preventive services as well as general preventive health recommendations were provided to patient.   Michaelle Adolphus, CMA   12/19/2023   After Visit Summary: (MyChart) Due to this being a telephonic visit, the after visit summary with patients personalized plan was offered to patient via MyChart   Notes: Please discuss with your provider about getting your ears flushed/cleaned out plus get a hearing test done at your next visit.

## 2024-01-03 ENCOUNTER — Ambulatory Visit: Payer: PPO | Admitting: Family Medicine

## 2024-01-08 ENCOUNTER — Ambulatory Visit: Payer: PPO | Admitting: Family Medicine

## 2024-02-01 ENCOUNTER — Other Ambulatory Visit: Payer: Self-pay | Admitting: Family Medicine

## 2024-02-01 DIAGNOSIS — I10 Essential (primary) hypertension: Secondary | ICD-10-CM

## 2024-03-21 ENCOUNTER — Encounter: Admitting: Family Medicine

## 2024-03-27 ENCOUNTER — Ambulatory Visit: Admitting: Family Medicine

## 2024-03-27 ENCOUNTER — Encounter: Payer: Self-pay | Admitting: Family Medicine

## 2024-03-27 VITALS — BP 116/63 | HR 62 | Temp 97.9°F | Ht 77.0 in | Wt 210.6 lb

## 2024-03-27 DIAGNOSIS — Z0001 Encounter for general adult medical examination with abnormal findings: Secondary | ICD-10-CM | POA: Diagnosis not present

## 2024-03-27 DIAGNOSIS — I2510 Atherosclerotic heart disease of native coronary artery without angina pectoris: Secondary | ICD-10-CM | POA: Diagnosis not present

## 2024-03-27 DIAGNOSIS — E782 Mixed hyperlipidemia: Secondary | ICD-10-CM | POA: Diagnosis not present

## 2024-03-27 DIAGNOSIS — I10 Essential (primary) hypertension: Secondary | ICD-10-CM

## 2024-03-27 DIAGNOSIS — Z1283 Encounter for screening for malignant neoplasm of skin: Secondary | ICD-10-CM | POA: Diagnosis not present

## 2024-03-27 DIAGNOSIS — I4819 Other persistent atrial fibrillation: Secondary | ICD-10-CM | POA: Diagnosis not present

## 2024-03-27 DIAGNOSIS — R7303 Prediabetes: Secondary | ICD-10-CM | POA: Diagnosis not present

## 2024-03-27 DIAGNOSIS — Z08 Encounter for follow-up examination after completed treatment for malignant neoplasm: Secondary | ICD-10-CM | POA: Diagnosis not present

## 2024-03-27 DIAGNOSIS — D225 Melanocytic nevi of trunk: Secondary | ICD-10-CM | POA: Diagnosis not present

## 2024-03-27 DIAGNOSIS — Z789 Other specified health status: Secondary | ICD-10-CM

## 2024-03-27 DIAGNOSIS — Z85820 Personal history of malignant melanoma of skin: Secondary | ICD-10-CM | POA: Diagnosis not present

## 2024-03-27 DIAGNOSIS — L570 Actinic keratosis: Secondary | ICD-10-CM | POA: Diagnosis not present

## 2024-03-27 DIAGNOSIS — Z0000 Encounter for general adult medical examination without abnormal findings: Secondary | ICD-10-CM

## 2024-03-27 DIAGNOSIS — X32XXXD Exposure to sunlight, subsequent encounter: Secondary | ICD-10-CM | POA: Diagnosis not present

## 2024-03-27 DIAGNOSIS — L820 Inflamed seborrheic keratosis: Secondary | ICD-10-CM | POA: Diagnosis not present

## 2024-03-27 LAB — BAYER DCA HB A1C WAIVED: HB A1C (BAYER DCA - WAIVED): 5.6 % (ref 4.8–5.6)

## 2024-03-27 NOTE — Patient Instructions (Signed)
 Health Maintenance, Male  Adopting a healthy lifestyle and getting preventive care are important in promoting health and wellness. Ask your health care provider about:  The right schedule for you to have regular tests and exams.  Things you can do on your own to prevent diseases and keep yourself healthy.  What should I know about diet, weight, and exercise?  Eat a healthy diet    Eat a diet that includes plenty of vegetables, fruits, low-fat dairy products, and lean protein.  Do not eat a lot of foods that are high in solid fats, added sugars, or sodium.  Maintain a healthy weight  Body mass index (BMI) is a measurement that can be used to identify possible weight problems. It estimates body fat based on height and weight. Your health care provider can help determine your BMI and help you achieve or maintain a healthy weight.  Get regular exercise  Get regular exercise. This is one of the most important things you can do for your health. Most adults should:  Exercise for at least 150 minutes each week. The exercise should increase your heart rate and make you sweat (moderate-intensity exercise).  Do strengthening exercises at least twice a week. This is in addition to the moderate-intensity exercise.  Spend less time sitting. Even light physical activity can be beneficial.  Watch cholesterol and blood lipids  Have your blood tested for lipids and cholesterol at 78 years of age, then have this test every 5 years.  You may need to have your cholesterol levels checked more often if:  Your lipid or cholesterol levels are high.  You are older than 78 years of age.  You are at high risk for heart disease.  What should I know about cancer screening?  Many types of cancers can be detected early and may often be prevented. Depending on your health history and family history, you may need to have cancer screening at various ages. This may include screening for:  Colorectal cancer.  Prostate cancer.  Skin cancer.  Lung  cancer.  What should I know about heart disease, diabetes, and high blood pressure?  Blood pressure and heart disease  High blood pressure causes heart disease and increases the risk of stroke. This is more likely to develop in people who have high blood pressure readings or are overweight.  Talk with your health care provider about your target blood pressure readings.  Have your blood pressure checked:  Every 3-5 years if you are 24-52 years of age.  Every year if you are 3 years old or older.  If you are between the ages of 60 and 72 and are a current or former smoker, ask your health care provider if you should have a one-time screening for abdominal aortic aneurysm (AAA).  Diabetes  Have regular diabetes screenings. This checks your fasting blood sugar level. Have the screening done:  Once every three years after age 66 if you are at a normal weight and have a low risk for diabetes.  More often and at a younger age if you are overweight or have a high risk for diabetes.  What should I know about preventing infection?  Hepatitis B  If you have a higher risk for hepatitis B, you should be screened for this virus. Talk with your health care provider to find out if you are at risk for hepatitis B infection.  Hepatitis C  Blood testing is recommended for:  Everyone born from [birth year] through 1965.  Anyone  with known risk factors for hepatitis C.  Sexually transmitted infections (STIs)  You should be screened each year for STIs, including gonorrhea and chlamydia, if:  You are sexually active and are younger than 78 years of age.  You are older than 78 years of age and your health care provider tells you that you are at risk for this type of infection.  Your sexual activity has changed since you were last screened, and you are at increased risk for chlamydia or gonorrhea. Ask your health care provider if you are at risk.  Ask your health care provider about whether you are at high risk for HIV. Your health care provider  may recommend a prescription medicine to help prevent HIV infection. If you choose to take medicine to prevent HIV, you should first get tested for HIV. You should then be tested every 3 months for as long as you are taking the medicine.  Follow these instructions at home:  Alcohol use  Do not drink alcohol if your health care provider tells you not to drink.  If you drink alcohol:  Limit how much you have to 0-2 drinks a day.  Know how much alcohol is in your drink. In the [place] one drink equals one 12 oz bottle of beer (355 mL), one 5 oz glass of wine (148 mL), or one 1 oz glass of hard liquor (44 mL).  Lifestyle  Do not use any products that contain nicotine or tobacco. These products include cigarettes, chewing tobacco, and vaping devices, such as e-cigarettes. If you need help quitting, ask your health care provider.  Do not use street drugs.  Do not share needles.  Ask your health care provider for help if you need support or information about quitting drugs.  General instructions  Schedule regular health, dental, and eye exams.  Stay current with your vaccines.  Tell your health care provider if:  You often feel depressed.  You have ever been abused or do not feel safe at home.  Summary  Adopting a healthy lifestyle and getting preventive care are important in promoting health and wellness.  Follow your health care provider's instructions about healthy diet, exercising, and getting tested or screened for diseases.  Follow your health care provider's instructions on monitoring your cholesterol and blood pressure.  This information is not intended to replace advice given to you by your health care provider. Make sure you discuss any questions you have with your health care provider.  Document Revised: 11/08/2020 Document Reviewed: 11/08/2020  Elsevier Patient Education  2024 [employer].

## 2024-03-27 NOTE — Progress Notes (Signed)
 Complete physical exam  Patient: William Jefferson   DOB: [birth year]   78 y.o. Male  MRN: [id number]  Subjective:    Chief Complaint  Patient presents with   Medical Management of Chronic Issues   Annual Exam    William Jefferson is a 78 y.o. male who presents today for a complete physical exam. He reports consuming a varied diet. He walks, gardens, and does [employer], broadcasting for physical activity. He generally feels well. He reports sleeping well. He does not have additional problems to discuss today.   Cardiovascular symptoms and management - Coronary artery disease with four stents placed - No chest pain or shortness of breath - On lisinopril  and metoprolol  - Atrial fibrillation, previously treated with cardioversion - Currently taking Eliquis   Hyperlipidemia and medication intolerance - Elevated cholesterol levels not at goal on last check - Intolerance to statins and Zetia due to leg weakness - Currently taking Nexletol  once daily, but occasionally pauses medication when feeling unwell - Cardiology has discussed treatment with injections for cholesterol but he is not interested  Glycemic control and lifestyle - Prediabetes with last A1c of 5.5 - Blood glucose slightly elevated      Most recent fall risk assessment:    03/27/2024    2:00 PM  Fall Risk   Falls in the past year? 0     Most recent depression screenings:    03/27/2024    2:00 PM 12/19/2023    9:41 AM  PHQ 2/9 Scores  PHQ - 2 Score 0 0  PHQ- 9 Score 0 0        Patient Care Team: William Annabella HERO, FNP as PCP - General (Family Medicine) William Vinie BROCKS, MD as PCP - Cardiology (Cardiology) William Rush, MD as Attending Physician (Urology) William Rush, MD (Dermatology)   Outpatient Medications Prior to Visit  Medication Sig   Bempedoic Acid  (NEXLETOL ) 180 MG TABS TAKE ONE TABLET BY MOUTH DAILY   cephALEXin (KEFLEX) 500 MG capsule Take 500 mg by mouth daily.   ELIQUIS  5 MG TABS tablet TAKE (1) TABLET  TWICE A DAY.   lisinopril  (ZESTRIL ) 5 MG tablet TAKE ONE TABLET DAILY   Lutein 20 MG CAPS Take 20 mg by mouth daily.   metoprolol  tartrate (LOPRESSOR ) 25 MG tablet TAKE ONE TABLET TWICE DAILY   nitroGLYCERIN  (NITROSTAT ) 0.4 MG SL tablet PLACE 1 TABLET UNDER THE TONGUE EVERY 5 MINUTES AS NEEDED FOR CHEST PAIN   Saw Palmetto 450 MG CAPS Take 900 mg by mouth daily.   SF 5000 PLUS 1.1 % CREA dental cream Take by mouth.   vitamin C (ASCORBIC ACID) 500 MG tablet Take 500 mg by mouth daily.   No facility-administered medications prior to visit.    ROS Negative unless specially indicated above in HPI.        Objective:     BP 116/63   Pulse 62   Temp 97.9 F (36.6 C) (Temporal)   Ht 6' 5 (1.956 m)   Wt 210 lb 9.6 oz (95.5 kg)   SpO2 97%   BMI 24.97 kg/m    Physical Exam Vitals and nursing note reviewed.  Constitutional:      General: He is not in acute distress.    Appearance: He is not ill-appearing, toxic-appearing or diaphoretic.  HENT:     Head: Normocephalic.     Right Ear: Tympanic membrane, ear canal and external ear normal.     Left Ear: Tympanic membrane, ear canal and external  ear normal.     Nose: Nose normal.     Mouth/Throat:     Mouth: Mucous membranes are moist.     Pharynx: Oropharynx is clear.  Eyes:     Extraocular Movements: Extraocular movements intact.     Conjunctiva/sclera: Conjunctivae normal.     Pupils: Pupils are equal, round, and reactive to light.  Neck:     Thyroid : No thyroid  mass, thyromegaly or thyroid  tenderness.  Cardiovascular:     Rate and Rhythm: Normal rate and regular rhythm.     Pulses: Normal pulses.     Heart sounds: Normal heart sounds. No murmur heard.    No friction rub. No gallop.  Pulmonary:     Effort: Pulmonary effort is normal.     Breath sounds: Normal breath sounds.  Abdominal:     General: Bowel sounds are normal. There is no distension.     Palpations: Abdomen is soft. There is no mass.     Tenderness:  There is no abdominal tenderness. There is no guarding.  Musculoskeletal:     Cervical back: Normal range of motion and neck supple. No tenderness.     Right lower leg: No edema.     Left lower leg: No edema.  Skin:    General: Skin is warm and dry.     Capillary Refill: Capillary refill takes less than 2 seconds.     Findings: No lesion or rash.  Neurological:     General: No focal deficit present.     Mental Status: He is alert and oriented to person, place, and time.  Psychiatric:        Mood and Affect: Mood normal.        Behavior: Behavior normal.        Thought Content: Thought content normal.      No results found for any visits on 03/27/24.     Assessment & Plan:    Routine Health Maintenance and Physical Exam  William Jefferson was seen today for medical management of chronic issues and annual exam.  Diagnoses and all orders for this visit:  Routine general medical examination at a health care facility  Primary hypertension -     Bayer DCA Hb A1c Waived  Prediabetes -     Bayer DCA Hb A1c Waived  Mixed hyperlipidemia  Coronary artery disease involving native coronary artery of native heart without angina pectoris  Persistent atrial fibrillation (HCC)  Statin intolerance      Adult Wellness Visit Routine adult wellness visit for a 78 year old male.  HTN BP at goal.   Atrial fibrillation, status post cardioversion, on anticoagulation Atrial fibrillation well-controlled post-cardioversion on Eliquis .  Atherosclerotic heart disease, status post coronary stents Coronary artery disease with four stents, no current symptoms. Regular follow-up with cardiologist Dr. Mona. - Follow up with Dr. Mona, cardiologist, in January.  Essential hypertension Hypertension managed with lisinopril  and metoprolol , no issues reported.  Mixed hyperlipidemia, statin and ezetimibe intolerant, on bempedoic acid  Managed with bempedoic acid  due to intolerance to statins and  ezetimibe. Reports leg weakness as a side effect. Cholesterol levels not at target. Not interested in injectables unless condition worsens. - Continue bempedoic acid  (Nexletol ) as tolerated. - Follow up with cardiology as rerquested  Prediabetes Prediabetes with A1c of 5.5%. - Check A1c today.       Immunization History  Administered Date(s) Administered   PFIZER(Purple Top)SARS-COV-2 Vaccination 07/29/2019, 08/19/2019, 04/16/2020   Td 06/02/1998   Tdap 09/17/2020   Zoster Recombinant(Shingrix ) 01/13/2022,  01/02/2023   Zoster, Live 03/31/2016    Health Maintenance  Topic Date Due   Influenza Vaccine  09/30/2024 (Originally 02/01/2024)   Pneumococcal Vaccine: 50+ Years (1 of 2 - PCV) 03/27/2025 (Originally [birth year]29/1965)   Hepatitis C Screening  03/27/2025 (Originally 04/29/1964)   COVID-19 Vaccine (4 - 2025-26 season) 04/12/2025 (Originally 03/03/2024)   Medicare Annual Wellness (AWV)  12/18/2024   DTaP/Tdap/Td (3 - Td or Tdap) 09/18/2030   Zoster Vaccines- Shingrix   Completed   HPV VACCINES  Aged Out   Meningococcal B Vaccine  Aged Out    Discussed health benefits of physical activity, and encouraged him to engage in regular exercise appropriate for his age and condition.  Problem List Items Addressed This Visit       Cardiovascular and Mediastinum   CAD (coronary artery disease)   Primary hypertension   Relevant Orders   Bayer DCA Hb A1c Waived   Persistent atrial fibrillation (HCC)     Other   Mixed hyperlipidemia   Statin intolerance   Prediabetes   Relevant Orders   Bayer DCA Hb A1c Waived   Other Visit Diagnoses       Routine general medical examination at a health care facility    -  Primary      Return in about 6 months (around 09/24/2024) for chronic follow up.   The patient indicates understanding of these issues and agrees with the plan.   Annabella CHRISTELLA Search, FNP

## 2024-03-28 ENCOUNTER — Ambulatory Visit: Payer: Self-pay | Admitting: Family Medicine

## 2024-04-15 ENCOUNTER — Other Ambulatory Visit: Payer: Self-pay | Admitting: Family Medicine

## 2024-04-15 DIAGNOSIS — I10 Essential (primary) hypertension: Secondary | ICD-10-CM

## 2024-06-17 ENCOUNTER — Telehealth: Payer: Self-pay | Admitting: Pharmacy Technician

## 2024-06-17 DIAGNOSIS — E785 Hyperlipidemia, unspecified: Secondary | ICD-10-CM

## 2024-06-17 NOTE — Telephone Encounter (Signed)
 Hi, insurance is asking for labs within 120 days. The last labs found are from 10/29/23. Can he please get updated lipid labs for this nexletol  prior authorization? Thank you

## 2024-06-17 NOTE — Telephone Encounter (Signed)
 Notified patient that Lipid panel was ordered for insurance auth and he can go to any [facility] to have them drawn. Verbalizes understanding.

## 2024-06-20 ENCOUNTER — Other Ambulatory Visit

## 2024-06-20 LAB — LIPID PANEL
Chol/HDL Ratio: 5.7 ratio — ABNORMAL HIGH (ref 0.0–5.0)
Cholesterol, Total: 165 mg/dL (ref 100–199)
HDL: 29 mg/dL — ABNORMAL LOW
LDL Chol Calc (NIH): 117 mg/dL — ABNORMAL HIGH (ref 0–99)
Triglycerides: 102 mg/dL (ref 0–149)
VLDL Cholesterol Cal: 19 mg/dL (ref 5–40)

## 2024-06-23 ENCOUNTER — Ambulatory Visit: Payer: Self-pay | Admitting: Internal Medicine

## 2024-07-09 ENCOUNTER — Encounter: Payer: Self-pay | Admitting: Internal Medicine

## 2024-07-11 ENCOUNTER — Other Ambulatory Visit: Payer: Self-pay

## 2024-07-11 DIAGNOSIS — I4819 Other persistent atrial fibrillation: Secondary | ICD-10-CM

## 2024-07-24 ENCOUNTER — Other Ambulatory Visit: Payer: Self-pay | Admitting: Family Medicine

## 2024-07-24 DIAGNOSIS — I10 Essential (primary) hypertension: Secondary | ICD-10-CM

## 2024-09-26 ENCOUNTER — Ambulatory Visit: Admitting: Internal Medicine

## 2024-09-26 ENCOUNTER — Ambulatory Visit: Payer: Self-pay | Admitting: Family Medicine

## 2024-12-19 ENCOUNTER — Ambulatory Visit: Payer: Self-pay
